# Patient Record
Sex: Male | Born: 1975 | Race: White | Hispanic: No | Smoking: Current every day smoker
Health system: Southern US, Community
[De-identification: ages and names within clinical notes are randomized; demographics above are authoritative.]

## PROBLEM LIST (undated history)

## (undated) DIAGNOSIS — G4730 Sleep apnea, unspecified: Secondary | ICD-10-CM

## (undated) DIAGNOSIS — K429 Umbilical hernia without obstruction or gangrene: Secondary | ICD-10-CM

## (undated) DIAGNOSIS — K219 Gastro-esophageal reflux disease without esophagitis: Secondary | ICD-10-CM

## (undated) DIAGNOSIS — J45909 Unspecified asthma, uncomplicated: Secondary | ICD-10-CM

## (undated) DIAGNOSIS — I10 Essential (primary) hypertension: Secondary | ICD-10-CM

## (undated) DIAGNOSIS — M109 Gout, unspecified: Secondary | ICD-10-CM

## (undated) DIAGNOSIS — J449 Chronic obstructive pulmonary disease, unspecified: Secondary | ICD-10-CM

## (undated) HISTORY — DX: Umbilical hernia without obstruction or gangrene: K42.9

## (undated) HISTORY — DX: Chronic obstructive pulmonary disease, unspecified: J44.9

## (undated) HISTORY — PX: CYST EXCISION: SHX5701

## (undated) HISTORY — DX: Unspecified asthma, uncomplicated: J45.909

---

## 2001-02-23 ENCOUNTER — Encounter: Payer: Self-pay | Admitting: Emergency Medicine

## 2001-02-23 ENCOUNTER — Emergency Department (HOSPITAL_COMMUNITY): Admission: EM | Admit: 2001-02-23 | Discharge: 2001-02-23 | Payer: Self-pay | Admitting: Emergency Medicine

## 2005-08-25 ENCOUNTER — Emergency Department (HOSPITAL_COMMUNITY): Admission: EM | Admit: 2005-08-25 | Discharge: 2005-08-25 | Payer: Self-pay | Admitting: Emergency Medicine

## 2009-03-21 ENCOUNTER — Emergency Department (HOSPITAL_COMMUNITY): Admission: EM | Admit: 2009-03-21 | Discharge: 2009-03-21 | Payer: Self-pay | Admitting: Family Medicine

## 2009-04-22 ENCOUNTER — Emergency Department (HOSPITAL_COMMUNITY): Admission: EM | Admit: 2009-04-22 | Discharge: 2009-04-22 | Payer: Self-pay | Admitting: Family Medicine

## 2009-06-20 ENCOUNTER — Emergency Department (HOSPITAL_COMMUNITY): Admission: EM | Admit: 2009-06-20 | Discharge: 2009-06-20 | Payer: Self-pay | Admitting: Family Medicine

## 2009-08-21 ENCOUNTER — Emergency Department (HOSPITAL_COMMUNITY): Admission: EM | Admit: 2009-08-21 | Discharge: 2009-08-21 | Payer: Self-pay | Admitting: Family Medicine

## 2009-09-25 ENCOUNTER — Emergency Department (HOSPITAL_COMMUNITY): Admission: EM | Admit: 2009-09-25 | Discharge: 2009-09-25 | Payer: Self-pay | Admitting: Family Medicine

## 2010-09-04 LAB — URIC ACID: Uric Acid, Serum: 10.6 mg/dL — ABNORMAL HIGH (ref 4.0–7.8)

## 2010-09-07 LAB — URIC ACID: Uric Acid, Serum: 10.6 mg/dL — ABNORMAL HIGH (ref 4.0–7.8)

## 2012-03-15 ENCOUNTER — Encounter (HOSPITAL_COMMUNITY): Payer: Self-pay | Admitting: Emergency Medicine

## 2012-03-15 ENCOUNTER — Emergency Department (INDEPENDENT_AMBULATORY_CARE_PROVIDER_SITE_OTHER)
Admission: EM | Admit: 2012-03-15 | Discharge: 2012-03-15 | Disposition: A | Discharge status: Home / Self Care | Payer: Self-pay | Source: Home / Self Care | Attending: Family Medicine | Admitting: Family Medicine

## 2012-03-15 DIAGNOSIS — S335XXA Sprain of ligaments of lumbar spine, initial encounter: Secondary | ICD-10-CM

## 2012-03-15 DIAGNOSIS — S39012A Strain of muscle, fascia and tendon of lower back, initial encounter: Secondary | ICD-10-CM

## 2012-03-15 DIAGNOSIS — M545 Low back pain: Secondary | ICD-10-CM

## 2012-03-15 HISTORY — DX: Gout, unspecified: M10.9

## 2012-03-15 HISTORY — DX: Essential (primary) hypertension: I10

## 2012-03-15 MED ORDER — LISINOPRIL 20 MG PO TABS: 20.0000 mg | ORAL_TABLET | Freq: Every day | ORAL | Status: DC

## 2012-03-15 MED ORDER — IBUPROFEN 600 MG PO TABS: 600.0000 mg | ORAL_TABLET | Freq: Three times a day (TID) | ORAL | Status: DC

## 2012-03-15 MED ORDER — ALLOPURINOL 300 MG PO TABS: 300.0000 mg | ORAL_TABLET | Freq: Every day | ORAL | Status: DC

## 2012-03-15 MED ORDER — TRAMADOL HCL 50 MG PO TABS: 50.0000 mg | ORAL_TABLET | Freq: Four times a day (QID) | ORAL | Status: DC | PRN

## 2012-03-15 MED ORDER — CYCLOBENZAPRINE HCL 10 MG PO TABS: 10.0000 mg | ORAL_TABLET | Freq: Three times a day (TID) | ORAL | Status: DC | PRN

## 2012-03-15 NOTE — ED Notes (Signed)
Pt having back pain that started yesterday evening and got progressively worse. 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 Pt states he has never had back pain like this in the past, just minor strains. Pt tried heat and aleve last night which helped some, but did not completely relieve his pain.

## 2012-03-18 NOTE — ED Provider Notes (Signed)
History     CSN: [id number]  Arrival date & time 03/15/12  1048   First MD Initiated Contact with Patient 03/15/12 1049      Chief Complaint  Patient presents with  . Back Pain    (Consider location/radiation/quality/duration/timing/severity/associated sxs/prior treatment) HPI Comments: 36 year old smoker male with history of HTN and gout. Here complaining of low back pain for 2 days. Worse on the left side. No radiation. Denies dysuria or hematuria. Denies lower extremity numbness weakness or paresthesia. Patient states that he works in [employer] and has been carried out sheet rock prior the beginning of his pain. Denies recent falls or direct injury. Patient reports he had prior history of low back pain but not as bad. Has used heat pad and took ibuprofen last night with some improvement. Patient also requiring refills on his chronic medications. He takes lisinopril 20 mg for high blood pressure and allopurinol 300 mg daily for gout. She's blood pressure is well-controlled now. Patient does not have medical insurance. He does have a primary care provider he has not seen this year.   Past Medical History  Diagnosis Date  . Hypertension   . Gout     History reviewed. No pertinent past surgical history.  History reviewed. No pertinent family history.  History  Substance Use Topics  . Smoking status: Current Every Day Smoker -- 1.0 packs/day    Types: Cigarettes  . Smokeless tobacco: Not on file  . Alcohol Use: Yes     occasional      Review of Systems  Constitutional: Negative for fever, chills, diaphoresis and fatigue.  Respiratory: Negative for cough and shortness of breath.   Cardiovascular: Negative for chest pain, palpitations and leg swelling.  Gastrointestinal: Negative for nausea, vomiting, abdominal pain and diarrhea.  Genitourinary: Negative for dysuria, frequency and hematuria.  Musculoskeletal: Positive for back pain.  Skin: Negative for rash.    Neurological: Negative for dizziness and headaches.    Allergies  Review of patient's allergies indicates no known allergies.  Home Medications   Current Outpatient Rx  Name Route Sig Dispense Refill  . ALLOPURINOL 300 MG PO TABS Oral Take 1 tablet (300 mg total) by mouth daily. 30 tablet 0  . CYCLOBENZAPRINE HCL 10 MG PO TABS Oral Take 1 tablet (10 mg total) by mouth 3 (three) times daily as needed for muscle spasms. 30 tablet 0  . IBUPROFEN 600 MG PO TABS Oral Take 1 tablet (600 mg total) by mouth 3 (three) times daily. 20 tablet 0  . LISINOPRIL 20 MG PO TABS Oral Take 1 tablet (20 mg total) by mouth daily. 30 tablet 0  . TRAMADOL HCL 50 MG PO TABS Oral Take 1 tablet (50 mg total) by mouth every 6 (six) hours as needed for pain. 20 tablet 0    BP 138/88  Pulse 77  Temp 98.3 F (36.8 C) (Oral)  Resp 16  SpO2 98%  Physical Exam  Nursing note and vitals reviewed. Constitutional: He is oriented to person, place, and time. He appears well-developed and well-nourished. No distress.  HENT:  Head: Normocephalic and atraumatic.  Eyes: Conjunctivae normal are normal. No scleral icterus.  Neck: Neck supple.  Cardiovascular: Normal rate, regular rhythm and normal heart sounds.   Pulmonary/Chest: Breath sounds normal.  Abdominal: Soft. Bowel sounds are normal. He exhibits no distension and no mass. There is no tenderness. There is no rebound and no guarding.       No CVT  Musculoskeletal:  Spine central: No obvious scoliosis or kyphosis. No pain over bone processes in entire spine.  Good range of motion.  Patient in semiflexed position. Increased pain reported with back extension.  Reported pain to palpation and increased muscle tone in left lower lumbar paravertebral muscles more than right side; worse with rotation of torso. No hematomas or other skin changes. Left hip: FROM. No pain on internal or external rotation. No clicks.     Lymphadenopathy:    He has no cervical  adenopathy.  Neurological: He is alert and oriented to person, place, and time.  Skin: No rash noted. He is not diaphoretic.    ED Course  Procedures (including critical care time)  Labs Reviewed - No data to display No results found.   1. Low back pain   2. Strain of lumbar paraspinal muscle       MDM  Treated with Flexeril, ibuprofen and tramadol. Back exercises handout provided. Supportive/preventive care discussed with patient and provided in writing. Also refilled allopurinol and lisinopril. Asked to return if persistent or worsening symptoms despite following treatment.        Sharin Grave, MD 03/20/12 0030

## 2013-06-07 ENCOUNTER — Emergency Department (HOSPITAL_COMMUNITY)
Admission: EM | Admit: 2013-06-07 | Discharge: 2013-06-07 | Disposition: A | Discharge status: Home / Self Care | Payer: Medicaid Other | Attending: Emergency Medicine | Admitting: Emergency Medicine

## 2013-06-07 ENCOUNTER — Encounter (HOSPITAL_COMMUNITY): Payer: Self-pay | Admitting: Emergency Medicine

## 2013-06-07 DIAGNOSIS — K089 Disorder of teeth and supporting structures, unspecified: Secondary | ICD-10-CM | POA: Insufficient documentation

## 2013-06-07 DIAGNOSIS — I10 Essential (primary) hypertension: Secondary | ICD-10-CM | POA: Insufficient documentation

## 2013-06-07 DIAGNOSIS — K029 Dental caries, unspecified: Secondary | ICD-10-CM | POA: Insufficient documentation

## 2013-06-07 DIAGNOSIS — Z79899 Other long term (current) drug therapy: Secondary | ICD-10-CM | POA: Insufficient documentation

## 2013-06-07 DIAGNOSIS — M109 Gout, unspecified: Secondary | ICD-10-CM | POA: Insufficient documentation

## 2013-06-07 DIAGNOSIS — K0889 Other specified disorders of teeth and supporting structures: Secondary | ICD-10-CM

## 2013-06-07 DIAGNOSIS — F17200 Nicotine dependence, unspecified, uncomplicated: Secondary | ICD-10-CM | POA: Insufficient documentation

## 2013-06-07 MED ORDER — HYDROCODONE-ACETAMINOPHEN 5-325 MG PO TABS: 1.0000 | ORAL_TABLET | ORAL | Status: DC | PRN

## 2013-06-07 MED ORDER — IBUPROFEN 400 MG PO TABS
800.0000 mg | ORAL_TABLET | Freq: Once | ORAL | Status: AC
  Administered 2013-06-07: 800 mg via ORAL
  Filled 2013-06-07: qty 2

## 2013-06-07 MED ORDER — PENICILLIN V POTASSIUM 500 MG PO TABS: 500.0000 mg | ORAL_TABLET | Freq: Four times a day (QID) | ORAL | Status: AC

## 2013-06-07 NOTE — ED Notes (Signed)
Pt. Stated, I just got out of jail and i had one bad tooth that was pulled while in jail and she said the one beside of it had a cavitiy.  i ate some ice cream on Thursday and its been hurting ever since.

## 2013-06-07 NOTE — ED Provider Notes (Signed)
CSN: [id number]     Arrival date & time 06/07/13  0916 History  This chart was scribed for non-physician practitioner, Jillyn Ledger, PA-C, working with Suzi Roots, MD by Shari Heritage, ED Scribe. This patient was seen in room TR06C/TR06C and the patient's care was started at 10:19 AM.    Chief Complaint  Patient presents with  . Dental Pain    The history is provided by the patient. No language interpreter was used.    HPI Comments: Albert Hill is a 37 y.o. Male with a PMH of HTN and gout who presents to the Emergency Department complaining of constant right upper dental pain onset 2 days ago. Patient reports that he was recently in jail where he had a tooth pulled about 1-2 weeks ago secondary to infection and dental decay. He finished a course of clindamycin about 3 weeks ago.  He states he was told that he had an tooth with a dental cary next to his pulled tooth.  He was also told this would need to be removed at a later time.  Patient states he is now having pain from this tooth.  He has taken Aleve at home without pain relief. He denies fever, dysphagia, headache, vomiting, neck pain, abdominal pain or any other symptoms at this time. He does not have a dentist.  He is a current every day smoker.    Past Medical History  Diagnosis Date  . Hypertension   . Gout    History reviewed. No pertinent past surgical history. No family history on file. History  Substance Use Topics  . Smoking status: Current Every Day Smoker -- 1.00 packs/day    Types: Cigarettes  . Smokeless tobacco: Not on file  . Alcohol Use: Yes     Comment: occasional    Review of Systems  Constitutional: Negative for fever.  HENT: Positive for dental problem. Negative for trouble swallowing.   Gastrointestinal: Negative for vomiting and abdominal pain.  Musculoskeletal: Negative for neck pain.  Neurological: Negative for headaches.  All other systems reviewed and are negative.    Allergies   Review of patient's allergies indicates no known allergies.  Home Medications   Current Outpatient Rx  Name  Route  Sig  Dispense  Refill  . allopurinol (ZYLOPRIM) 300 MG tablet   Oral   Take 1 tablet (300 mg total) by mouth daily.   30 tablet   0   . cyclobenzaprine (FLEXERIL) 10 MG tablet   Oral   Take 1 tablet (10 mg total) by mouth 3 (three) times daily as needed for muscle spasms.   30 tablet   0   . ibuprofen (ADVIL,MOTRIN) 600 MG tablet   Oral   Take 1 tablet (600 mg total) by mouth 3 (three) times daily.   20 tablet   0   . lisinopril (PRINIVIL,ZESTRIL) 20 MG tablet   Oral   Take 1 tablet (20 mg total) by mouth daily.   30 tablet   0   . traMADol (ULTRAM) 50 MG tablet   Oral   Take 1 tablet (50 mg total) by mouth every 6 (six) hours as needed for pain.   20 tablet   0    Triage Vitals: BP 154/99  Pulse 97  Temp(Src) 96.8 F (36 C) (Oral)  SpO2 100%  Filed Vitals:   06/07/13 0922 06/07/13 1028  BP: 154/99 143/98  Pulse: 97 75  Temp: 96.8 F (36 C) 97 F (36.1 C)  TempSrc: Oral Oral  Resp: 17 15  SpO2: 100% 98%    Physical Exam  Nursing note and vitals reviewed. Constitutional: He is oriented to person, place, and time. He appears well-developed and well-nourished. No distress.  HENT:  Head: Normocephalic and atraumatic.  Right Ear: Tympanic membrane, external ear and ear canal normal.  Left Ear: Tympanic membrane, external ear and ear canal normal.  Nose: Right sinus exhibits no maxillary sinus tenderness and no frontal sinus tenderness. Left sinus exhibits no maxillary sinus tenderness and no frontal sinus tenderness.  Mouth/Throat:    Tenderness to palpation to a right upper molar with evidence of a dental cary and no surrounding edema/abscess.  No trismus.  No palpable edema to the face throughout.  Uvula midline.  TM's gray and translucent bilaterally  Eyes: Conjunctivae and EOM are normal. Pupils are equal, round, and reactive to light.  Right eye exhibits no discharge. Left eye exhibits no discharge.  Neck: Normal range of motion. Neck supple. No tracheal deviation present.  No edema to the neck throughout.  No LAD.  No submental fullness.      Cardiovascular: Normal rate, regular rhythm and normal heart sounds.  Exam reveals no gallop and no friction rub.   No murmur heard. Pulmonary/Chest: Effort normal and breath sounds normal. No respiratory distress. He has no wheezes. He has no rales. He exhibits no tenderness.  Abdominal: Soft. There is no tenderness.  Musculoskeletal: Normal range of motion. He exhibits no edema and no tenderness.  Neurological: He is alert and oriented to person, place, and time.  Skin: Skin is warm and dry. He is not diaphoretic.  Psychiatric: He has a normal mood and affect. His behavior is normal.    ED Course  Procedures (including critical care time) DIAGNOSTIC STUDIES: Oxygen Saturation is 100% on room air, normal by my interpretation.    COORDINATION OF CARE: 10:23 AM- Will give ibuprofen in the ED and prescribe Vicodin for pain relief and penicillin to treat infection. Advised of return precautions.  Patient informed of current plan for treatment and evaluation and agrees with plan at this time.    MDM   Albert Hill is a 37 y.o. male with a PMH of HTN and gout who presents to the Emergency Department complaining of constant right upper dental pain onset 2 days ago.    Etiology of dental pain possibly due to dental caries.  No concerning signs/symptoms for Ludwig's Angina at this time.  Patient afebrile and non-toxic in appearance.  Patient instructed to follow-up with a dentist for further evaluation and management.  Resources provided.  Return precautions were given.  Patient in agreement with discharge and plan.    Discharge Medication List as of 06/07/2013 10:32 AM    START taking these medications   Details  HYDROcodone-acetaminophen (NORCO/VICODIN) 5-325 MG per  tablet Take 1-2 tablets by mouth every 4 (four) hours as needed., Starting 06/07/2013, Until Discontinued, Print    penicillin v potassium (VEETID) 500 MG tablet Take 1 tablet (500 mg total) by mouth 4 (four) times daily., Starting 06/07/2013, Last dose on Sat 06/14/13, Print         Final impressions: 1. Pain, dental      Thomasenia Sales   I personally performed the services described in this documentation, which was scribed in my presence. The recorded information has been reviewed and is accurate.        Jillyn Ledger, PA-C 06/08/13 2228

## 2013-06-10 NOTE — ED Provider Notes (Signed)
Medical screening examination/treatment/procedure(s) were performed by non-physician practitioner and as supervising physician I was immediately available for consultation/collaboration.  EKG Interpretation   None         Tomasita Beevers E Jaylee Freeze, MD 06/10/13 0730 

## 2013-07-09 ENCOUNTER — Encounter (HOSPITAL_COMMUNITY): Payer: Self-pay | Admitting: Emergency Medicine

## 2013-07-09 ENCOUNTER — Emergency Department (HOSPITAL_COMMUNITY)
Admission: EM | Admit: 2013-07-09 | Discharge: 2013-07-09 | Disposition: A | Discharge status: Home / Self Care | Payer: Medicaid Other | Attending: Emergency Medicine | Admitting: Emergency Medicine

## 2013-07-09 DIAGNOSIS — F17200 Nicotine dependence, unspecified, uncomplicated: Secondary | ICD-10-CM | POA: Insufficient documentation

## 2013-07-09 DIAGNOSIS — M109 Gout, unspecified: Secondary | ICD-10-CM | POA: Insufficient documentation

## 2013-07-09 DIAGNOSIS — I10 Essential (primary) hypertension: Secondary | ICD-10-CM | POA: Insufficient documentation

## 2013-07-09 MED ORDER — ALLOPURINOL 300 MG PO TABS: 300.0000 mg | ORAL_TABLET | Freq: Every day | ORAL | Status: DC

## 2013-07-09 MED ORDER — NAPROXEN 500 MG PO TABS
500.0000 mg | ORAL_TABLET | Freq: Once | ORAL | Status: AC
  Administered 2013-07-09: 500 mg via ORAL
  Filled 2013-07-09: qty 1

## 2013-07-09 MED ORDER — COLCHICINE 0.6 MG PO TABS
1.2000 mg | ORAL_TABLET | Freq: Once | ORAL | Status: AC
  Administered 2013-07-09: 1.2 mg via ORAL
  Filled 2013-07-09: qty 2

## 2013-07-09 MED ORDER — NAPROXEN 500 MG PO TABS
500.0000 mg | ORAL_TABLET | Freq: Two times a day (BID) | ORAL | Status: DC
Start: 2013-07-09 — End: 2017-11-15

## 2013-07-09 NOTE — ED Provider Notes (Signed)
CSN: [id number]     Arrival date & time 07/09/13  1611 History  This chart was scribed for non-physician practitioner Alvina Chou, PA-C working with Blanchie Dessert, MD by Ludger Nutting, ED Scribe. This patient was seen in room [facility] and the patient's care was started at 1611.    Chief Complaint  Patient presents with  . Gout    The history is provided by the patient. No language interpreter was used.    HPI Comments: Albert Hill is a 38 y.o. Male with past medical history of gout who presents to the Emergency Department complaining of constant, gradually worsened right ankle pain that began a few days ago. He denies recent injuries or trauma. He reports having a history of gout and believes this to be a flare up. He has taken Aleve at home without relief. He states he no longer takes allopurinol because he does not have a PCP. He denies numbness or weakness.   Past Medical History  Diagnosis Date  . Hypertension   . Gout    History reviewed. No pertinent past surgical history. History reviewed. No pertinent family history. History  Substance Use Topics  . Smoking status: Current Every Day Smoker -- 1.00 packs/day    Types: Cigarettes  . Smokeless tobacco: Not on file  . Alcohol Use: Yes     Comment: occasional    Review of Systems  Musculoskeletal: Positive for arthralgias (right ankle).  Neurological: Negative for weakness and numbness.    Allergies  Review of patient's allergies indicates no known allergies.  Home Medications   Current Outpatient Rx  Name  Route  Sig  Dispense  Refill  . ibuprofen (ADVIL,MOTRIN) 200 MG tablet   Oral   Take 200 mg by mouth every 6 (six) hours as needed for mild pain.         . naproxen sodium (ANAPROX) 220 MG tablet   Oral   Take 220 mg by mouth 2 (two) times daily as needed (for pain).         Marland Kitchen allopurinol (ZYLOPRIM) 300 MG tablet   Oral   Take 1 tablet (300 mg total) by mouth daily.   30 tablet   0     BP 142/97  Pulse 98  Temp(Src) 98 F (36.7 C) (Oral)  Resp 16  SpO2 96% Physical Exam  Nursing note and vitals reviewed. Constitutional: He is oriented to person, place, and time. He appears well-developed and well-nourished.  HENT:  Head: Normocephalic and atraumatic.  Cardiovascular: Normal rate and intact distal pulses.   Pulmonary/Chest: Effort normal.  Abdominal: He exhibits no distension.  Musculoskeletal: He exhibits no edema and no tenderness.  No right ankle edema. No tenderness to palpation. No redness or obvious deformity. Limited ROM due to pain.   Neurological: He is alert and oriented to person, place, and time.  Skin: Skin is warm and dry.  Psychiatric: He has a normal mood and affect.    ED Course  Procedures (including critical care time)  DIAGNOSTIC STUDIES: Oxygen Saturation is 96% on RA, adequate by my interpretation.    COORDINATION OF CARE: 4:46 PM Will give prescription for allopurinol. Discussed treatment plan with pt at bedside and pt agreed to plan.   Labs Review Labs Reviewed - No data to display Imaging Review No results found.  EKG Interpretation   None       MDM   1. Gout attack    5:05 PM Patient likely having a gout attack  due to similar sensation as previous. Patient will have colchicine here and naprosyn for pain. Patient will be discharged with allopurinol and naprosyn prescriptions. Patient advised to follow up with PCP from resource guide.   I personally performed the services described in this documentation, which was scribed in my presence. The recorded information has been reviewed and is accurate.   Alvina Chou, PA-C 07/09/13 1706

## 2013-07-09 NOTE — Discharge Instructions (Signed)
Take Naprosyn as needed for pain. Take Allopurinol daily. Refer to attached documents for more information. Follow up with a primary care doctor from the resource guide below.    Emergency Department Resource Guide 1) Find a Doctor and Pay Out of Pocket Although you won't have to find out who is covered by your insurance plan, it is a good idea to ask around and get recommendations. You will then need to call the office and see if the doctor you have chosen will accept you as a new patient and what types of options they offer for patients who are self-pay. Some doctors offer discounts or will set up payment plans for their patients who do not have insurance, but you will need to ask so you aren't surprised when you get to your appointment.  2) Contact Your Local Health Department Not all health departments have doctors that can see patients for sick visits, but many do, so it is worth a call to see if yours does. If you don't know where your local health department is, you can check in your phone book. The CDC also has a tool to help you locate your state's health department, and many state websites also have listings of all of their local health departments.  3) Find a [facility] If your illness is not likely to be very severe or complicated, you may want to try a walk in clinic. These are popping up all over the country in pharmacies, drugstores, and shopping centers. They're usually staffed by nurse practitioners or physician assistants that have been trained to treat common illnesses and complaints. They're usually fairly quick and inexpensive. However, if you have serious medical issues or chronic medical problems, these are probably not your best option.  No Primary Care Doctor: - Call Health Connect at  [phone] - they can help you locate a primary care doctor that  accepts your insurance, provides certain services, etc. - Physician Referral Service- [phone]  Chronic Pain  Problems: Organization         Address  Phone   Notes  [facility]  [phone] Patients need to be referred by their primary care doctor.   Medication Assistance: Organization         Address  Phone   Notes  [facility] [place] [address] [place], [place] [address] [phone] --Must be a resident of [facility] -- Must have NO insurance coverage whatsoever (no Medicaid/ Medicare, etc.) -- The pt. MUST have a primary care doctor that directs their care regularly and follows them in the community   Med[facility]ist  [phone]   [employer]  [phone]    Agencies that provide inexpensive medical care: Organization         Address  Phone   Notes  [place]  [phone]   [facility]    [phone]   [facility] [place], [place] [address] [phone]   [place] [address] [place] [phone]   Planned Parenthood    [phone]   [facility]    [phone]   [place] and [place] [address], [place] Phone:  [phone], Fax:  [phone] Hours of Operation:  9 am - 6 pm, M-F.  Also accepts Medicaid/Medicare and self-pay.  [facility] for [place] [address]  [place], [place], [place] Phone: [phone], Fax: [phone]. Hours of Operation:  8:30 am - 5:30 pm, M-F.  Also accepts Medicaid and self-pay.  [facility] [place] [address], [place] Phone: [phone]   [place], [place], [place] [phone], Ext. [phone] Mondays & Thursdays: 7-9 AM.  First 15 patients are seen on a first come, first serve basis.    [place] Providers:  Organization         Address  Phone   Notes  [facility] [address], [place] [phone] Also  accepts self-pay patients.  [facility] [address], [place]  [phone]   [place], [address], [place] [phone]   [facility] [address] [place] [phone]   Lucianne Lei [address], [place]   [phone] Only accepts [place] Access [place] patients after they have their name applied to their card.   Self-Pay (no insurance) in [facility]:  Organization         Address  Phone   Notes  Sickle Cell Patients, [facility] [place] [phone]   [facility] [place] [phone]   [facility] [place]  [place], [place],  [phone]   [facility]/Dr. Osei-Bonsu  [address] [place] or [address], [place] [phone] Phone number for both [place] and [place] locations is the same.  Urgent Medical and [facility] [address], [place] [phone]   [facility] [address] [place] or [address] [phone]   [facility] [address], [place] [phone], phone; [phone], fax Sees patients 1st and 3rd Saturday of every month.  Must not qualify for public or private insurance (i.e. Medicaid, Medicare, [place] Health Choice, Veterans' Benefits)  Household income should be no more than 200% of the poverty level The clinic cannot treat you if you are pregnant or think you are pregnant  Sexually transmitted diseases are not treated at the clinic.    Dental Care: Organization         Address  Phone  Notes  [facility] Department of [facility] [place] [phone] Accepts children up to age 34 who are enrolled in [place] or [place]; pregnant  women with a Medicaid card; and children who have applied for Medicaid or [place] Health Choice, but were declined, whose parents can pay a reduced fee at time of service.  [facility] of [facility]  [address], [place] [phone] Accepts children up to age 24 who are enrolled in [place] or [place]; pregnant women with a Medicaid card; and children who have applied for Medicaid or [place] Health Choice, but were declined, whose parents can pay a reduced fee at time of service.  [facility] Adult [facility] Access PROGRAM  [place] [phone] Patients are seen by appointment only. Walk-ins are not accepted. [place] will see patients 57 years of age and older. Monday - Tuesday (8am-5pm) Most Wednesdays (8:30-5pm) $30 per visit, cash only  [place]  [address], [place] [phone] Patients are seen by appointment only. Walk-ins are not accepted. [place] will see patients 47 years of age and older. One Wednesday Evening (Monthly: Volunteer Based).  $30 per visit, cash only  [place]  [phone] for adults; Children under age 20, call Graduate Pediatric Dentistry at [phone]. Children aged 75-14, please call [phone] to request a pediatric application.  Dental services are provided in all areas of dental care including fillings, crowns and bridges, complete and partial dentures, implants, gum treatment, root canals, and extractions. Preventive care is also provided. Treatment is provided to both adults and children. Patients are selected via a lottery and there is often a waiting list.   [facility] [address], [place]  [phone] [email or web address]   [facility] [address] [place], [place] [phone], Ext. [phone] Second and Fourth Thursday of each month, opens at 6:30 AM; Clinic ends at 9 AM.  Patients are  seen on a first-come first-served basis, and a limited number are seen during each clinic.   [facility]  [address] Hillard Danker [place], [place] [phone]   Eligibility Requirements You must have lived in [place], [place], or [place] counties for at least the last three months.   You cannot be eligible for state or federal sponsored [employer], including [employer], [place], or [employer].   You generally cannot be eligible for healthcare insurance through your employer.    How to apply: Eligibility screenings are held every Tuesday and Wednesday afternoon from 1:00 pm until 4:00 pm. You do not need an appointment for the interview!  [facility] [place] [address] [place], [place]   [place]  [place] Department  [place]  [phone]    Behavioral Health Resources in the Community: Intensive Outpatient Programs Organization         Address  Phone  Notes  [place]. [address] [place], [place] [phone]   [facility] [address] [place], [place]   [facility]: [facility] [address] [place], [place]   [place] [address]  [place], [place] or [phone]   Substance Abuse Resources Organization         Address  Phone  Notes  Alcohol and Drug Services  [phone]   [place]  [phone]   [facility] [place]   Chinita Pester  [phone]   Residential & Outpatient Substance Abuse Program  [phone]   Psychological Services Organization         Address  Phone  Notes  [facility] [place]  [place]  [phone]   [place] [address] [place] or [phone]    Mobile Crisis  Teams Organization         Address  Phone  Notes  [facility], Mobile Crisis Care Unit  [phone]   [facility]  [address] [place], [place]   [place] [address] [place] [phone]    Self-Help/Support Groups Organization         Address  Phone             Notes  [place] of [place] -  variety of support groups  [phone]- [phone] Call for more information  Narcotics Anonymous (NA), [facility] [address], [employer] [place]  2 meetings at this location   Residential Facilities manager         Address  Phone  Notes  [facility] [place],    [place]  [phone]   [facility]  [address] [place] 623762, [place], [place]   [place], [place] [phone] Admissions: 8am-3pm M-F  Incentives Substance [place] [address],    [place], [place] [phone]   [facility] [address] [place], [place], [place]   [facility] [address]  [place], [place]   [facility] Programs - Intensive Outpatient [address] Kristeen [address], [place], [place]   [facility] [place] [address].,  [place], [place] [phone] or [phone]   [facility] Treatment Services (RTS) [address] [place], [place] Accepts Medicaid  [facility] [place] [address],  [place] [phone] Substance Abuse/Addiction Treatment   [facility] Organization         Address  Phone  Notes  [facility]  [phone]   Domenic Schwab, PhD [address] [place], [place]   [phone] or [phone]   [place], [place] [phone]   [facility] [address] [place], [place] [phone]  Insurance/Medicaid/sponsorship through [facility] and Families [address]                                    [place], [place] [phone] [place] [address], [place] [phone]    Dr. Adele Schilder  [phone]   [facility] Dept. 1) [address] [place] 2) [place] 3)  [place] [address], [place] [phone]  [phone]   [place] [phone] or [phone] (After Hours)

## 2013-07-09 NOTE — ED Notes (Signed)
Pt reports hx of gout, flare up 4 days, right ankle. 9/10 pain. ambulatory

## 2013-07-10 NOTE — ED Provider Notes (Signed)
Medical screening examination/treatment/procedure(s) were performed by non-physician practitioner and as supervising physician I was immediately available for consultation/collaboration.  EKG Interpretation   None        Jasper Riling. Alvino Chapel, MD 07/10/13 0002

## 2017-11-15 ENCOUNTER — Emergency Department (HOSPITAL_COMMUNITY)
Admission: EM | Admit: 2017-11-15 | Discharge: 2017-11-15 | Disposition: A | Discharge status: Home / Self Care | Payer: Self-pay | Attending: Emergency Medicine | Admitting: Emergency Medicine

## 2017-11-15 ENCOUNTER — Emergency Department (HOSPITAL_COMMUNITY): Payer: Self-pay

## 2017-11-15 ENCOUNTER — Encounter (HOSPITAL_COMMUNITY): Payer: Self-pay | Admitting: *Deleted

## 2017-11-15 DIAGNOSIS — Z79899 Other long term (current) drug therapy: Secondary | ICD-10-CM | POA: Insufficient documentation

## 2017-11-15 DIAGNOSIS — B9789 Other viral agents as the cause of diseases classified elsewhere: Secondary | ICD-10-CM

## 2017-11-15 DIAGNOSIS — J069 Acute upper respiratory infection, unspecified: Secondary | ICD-10-CM | POA: Insufficient documentation

## 2017-11-15 DIAGNOSIS — J441 Chronic obstructive pulmonary disease with (acute) exacerbation: Secondary | ICD-10-CM | POA: Insufficient documentation

## 2017-11-15 DIAGNOSIS — F17210 Nicotine dependence, cigarettes, uncomplicated: Secondary | ICD-10-CM | POA: Insufficient documentation

## 2017-11-15 DIAGNOSIS — I10 Essential (primary) hypertension: Secondary | ICD-10-CM | POA: Insufficient documentation

## 2017-11-15 MED ORDER — IPRATROPIUM-ALBUTEROL 0.5-2.5 (3) MG/3ML IN SOLN
3.0000 mL | Freq: Once | RESPIRATORY_TRACT | Status: AC
  Administered 2017-11-15: 3 mL via RESPIRATORY_TRACT
  Filled 2017-11-15: qty 3

## 2017-11-15 MED ORDER — ALBUTEROL SULFATE HFA 108 (90 BASE) MCG/ACT IN AERS: 1.0000 | INHALATION_SPRAY | Freq: Four times a day (QID) | RESPIRATORY_TRACT | 0 refills | Status: DC | PRN

## 2017-11-15 MED ORDER — PREDNISONE 20 MG PO TABS
60.0000 mg | ORAL_TABLET | Freq: Once | ORAL | Status: AC
  Administered 2017-11-15: 60 mg via ORAL
  Filled 2017-11-15: qty 3

## 2017-11-15 MED ORDER — ALBUTEROL (5 MG/ML) CONTINUOUS INHALATION SOLN
10.0000 mg/h | INHALATION_SOLUTION | Freq: Once | RESPIRATORY_TRACT | Status: AC
  Administered 2017-11-15: 10 mg/h via RESPIRATORY_TRACT
  Filled 2017-11-15: qty 20

## 2017-11-15 MED ORDER — ALBUTEROL SULFATE HFA 108 (90 BASE) MCG/ACT IN AERS: 2.0000 | INHALATION_SPRAY | Freq: Four times a day (QID) | RESPIRATORY_TRACT | 0 refills | Status: DC | PRN

## 2017-11-15 MED ORDER — PREDNISONE 20 MG PO TABS: 20.0000 mg | ORAL_TABLET | Freq: Two times a day (BID) | ORAL | 0 refills | Status: DC

## 2017-11-15 MED ORDER — PREDNISONE 20 MG PO TABS: 40.0000 mg | ORAL_TABLET | Freq: Every day | ORAL | 0 refills | Status: AC

## 2017-11-15 NOTE — ED Provider Notes (Signed)
[place] [facility] DEPT Provider Note   CSN: [id number] Arrival date & time: 11/15/17  0920   History   Chief Complaint Chief Complaint  Patient presents with  . Cough  . Shortness of Breath    HPI Albert Hill is a 42 y.o. male with a past medical history of gout, HTN, opiate use, tobacco use who presented to the ED for concern of respiratory infection.   He reports a roughly 1 week history of shortness of breath with associated cough. The cough was initially productive with green sputum but has since become nonproductive.  He has also had rhinorrhea, congestion. He feels his symptoms have worsened since onset. He tried OTC mucinex with no improvement in sx. he denies fever, sore throat, no known sick contacts.  He is a smoker, 1 pack/day for 20 years.  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  On intake the nurse was concerned with his lung exam and advised presenting to the emergency department for evaluation and clearance prior to returning to rehab.  Last heroin use was yesterday.   Past Medical History:  Diagnosis Date  . Gout   . Hypertension     There are no active problems to display for this patient.   History reviewed. No pertinent surgical history.     Home Medications    Prior to Admission medications   Medication Sig Start Date End Date Taking? Authorizing Provider  acetaminophen (TYLENOL) 500 MG tablet Take 1,000 mg by mouth daily as needed for moderate pain.   Yes [provider]  guaiFENesin (MUCINEX PO) Take 2 tablets by mouth daily as needed.   Yes [provider]  albuterol (PROVENTIL HFA;VENTOLIN HFA) 108 (90 Base) MCG/ACT inhaler Inhale 2 puffs into the lungs every 6 (six) hours as needed for wheezing or shortness of breath. 11/15/17   Tawny Asal, MD  albuterol (PROVENTIL HFA;VENTOLIN HFA) 108 (90 Base) MCG/ACT inhaler Inhale 1-2 puffs into the lungs every 6 (six) hours  as needed for wheezing. 11/15/17   Tanna Furry, MD  predniSONE (DELTASONE) 20 MG tablet Take 2 tablets (40 mg total) by mouth daily with breakfast for 4 days. 11/16/17 11/20/17  Tawny Asal, MD  predniSONE (DELTASONE) 20 MG tablet Take 1 tablet (20 mg total) by mouth 2 (two) times daily with a meal. 11/15/17   Tanna Furry, MD   Not currently taking medications.   Family History No family history on file.  Social History Social History   Tobacco Use  . Smoking status: Current Every Day Smoker    Packs/day: 1.00    Types: Cigarettes  Substance Use Topics  . Alcohol use: Yes    Comment: occasional  . Drug use: No     Allergies   Patient has no known allergies.   Review of Systems Review of Systems  Constitutional: Negative for fever.  HENT: Positive for congestion and rhinorrhea. Negative for sore throat.   Respiratory: Positive for cough and shortness of breath.   Cardiovascular: Negative for leg swelling.  Gastrointestinal: Negative for abdominal pain.  Hematological: Negative for adenopathy.     Physical Exam Updated Vital Signs BP 132/70   Pulse (!) 102   Temp 98.4 F (36.9 C) (Oral)   Resp 14   Ht 5\' 11"  (1.803 m)   Wt 83.9 kg (185 lb)   SpO2 95%   BMI 25.80 kg/m   General: Sitting on edge of stretcher, fatigued appearing,  no acute distress Head: Normocephalic, atraumatic Eyes: PERRL, normal conjuctiva  ENT: Moist mucus membranes, no pharyngeal exudates  CV: RRR, no murmur appreciated  Resp: Coarse breath sounds throughout with occasional wheeze, normal work of breathing, no distress  Abd: Soft, +BS, non-tender  Extr: No LE edema, good peripheral pulses  Neuro: Alert and oriented x3, moving all extremities spontaneously  Skin: Warm, dry, normal skin turgor      ED Treatments / Results  Labs (all labs ordered are listed, but only abnormal results are displayed) Labs Reviewed - No data to display  EKG None  Radiology Dg Chest 2 View  Result Date:  11/15/2017 CLINICAL DATA:  Shortness of breath. EXAM: CHEST - 2 VIEW COMPARISON:  None. FINDINGS: The heart size and mediastinal contours are within normal limits. Both lungs are clear. No pneumothorax or pleural effusion is noted. The visualized skeletal structures are unremarkable. IMPRESSION: No active cardiopulmonary disease. Electronically Signed   By: Marijo Conception, M.D.   On: 11/15/2017 09:54    Procedures Procedures (including critical care time)  Medications Ordered in ED Medications  ipratropium-albuterol (DUONEB) 0.5-2.5 (3) MG/3ML nebulizer solution 3 mL (3 mLs Nebulization Given 11/15/17 1330)  predniSONE (DELTASONE) tablet 60 mg (60 mg Oral Given 11/15/17 1404)  albuterol (PROVENTIL,VENTOLIN) solution continuous neb (10 mg/hr Nebulization Given 11/15/17 1413)     Initial Impression / Assessment and Plan / ED Course  I have reviewed the triage vital signs and the nursing notes.  Pertinent labs & imaging results that were available during my care of the patient were reviewed by me and considered in my medical decision making (see chart for details).  42 year old with 20-pack-year smoking history presenting with shortness of breath and cough for 1 week.  He does have coarse breath sounds throughout his breathing with occasional wheeze.  Oxygen saturation around 94-96 during history, documented 93.  He may have a component of underlying obstructive lung disease given his smoking history and wheezing.  Chest x-ray did not show any evidence of pneumonia.  At this point, feel his symptoms are due to a viral upper respiratory infection, symptoms may be exacerbated by underlying smoking history.  Will administer breathing treatment, and treat empirically with steroids.  Patient improved with breathing treatments throughout ED stay and remained stable from respiratory standpoint.  Patient is safe to return back to rehab for ongoing management of opiate use.  Final Clinical Impressions(s) / ED  Diagnoses   Final diagnoses:  Viral URI with cough  COPD exacerbation [facility])    ED Discharge Orders        Ordered    predniSONE (DELTASONE) 20 MG tablet  Daily with breakfast     11/15/17 1510    albuterol (PROVENTIL HFA;VENTOLIN HFA) 108 (90 Base) MCG/ACT inhaler  Every 6 hours PRN    Note to Pharmacy:  OK to fill most affordable option   11/15/17 1510    albuterol (PROVENTIL HFA;VENTOLIN HFA) 108 (90 Base) MCG/ACT inhaler  Every 6 hours PRN     11/15/17 1511    predniSONE (DELTASONE) 20 MG tablet  2 times daily with meals     11/15/17 1511       Tawny Asal, MD 11/15/17 1516    Tanna Furry, MD 11/22/17 1153

## 2017-11-15 NOTE — ED Notes (Signed)
Called Pt to be roomed, no response in lobby x1.

## 2017-11-15 NOTE — Progress Notes (Signed)
Pt would only use the mouth piece

## 2017-11-15 NOTE — ED Triage Notes (Signed)
[facility] for possible upper [facility] infection. Pt was trying to get into rehab facility but was told he would need medical attention first.

## 2017-11-15 NOTE — ED Provider Notes (Signed)
Patient seen and evaluated.  Discussed with resident.  On exam he has had a nebulized albuterol treatment.  He has good air movement.  Still with some wheezing and prolongation.  States it made him feels "somewhat better".  93% on room air.  Plan additional of nebulized albuterol.  P.o. prednisone.  Ultimately be appropriate for outpatient treatment.  Prednisone, albuterol MDI.  Would be cleared to return to day [facility].   Tanna Furry, MD 11/15/17 1359

## 2017-11-15 NOTE — Discharge Instructions (Addendum)
Nice to meet you Albert Hill.  Your chest x ray looks clear and is reassuring that you do not have a pneumonia. You likely have a virus causing this respiratory infection that may be worse due to your smoking and because you have smoked for a long time. We are going to prescribe an inhaler to help with your breathing and a steroid that you can take for the next few days. You can also continue mucinex. It is safe to go on to your rehab facility to continue treatment.

## 2017-11-15 NOTE — ED Notes (Signed)
Respiratory called

## 2018-01-15 ENCOUNTER — Ambulatory Visit: Payer: Self-pay | Admitting: Family Medicine

## 2018-01-15 VITALS — BP 136/90 | HR 105 | Temp 98.3°F | Ht 69.29 in | Wt 204.1 lb

## 2018-01-15 DIAGNOSIS — Z09 Encounter for follow-up examination after completed treatment for conditions other than malignant neoplasm: Secondary | ICD-10-CM

## 2018-01-15 DIAGNOSIS — Z0000 Encounter for general adult medical examination without abnormal findings: Secondary | ICD-10-CM

## 2018-01-15 DIAGNOSIS — M10479 Other secondary gout, unspecified ankle and foot: Secondary | ICD-10-CM

## 2018-01-15 DIAGNOSIS — I10 Essential (primary) hypertension: Secondary | ICD-10-CM

## 2018-01-15 MED ORDER — AMLODIPINE BESYLATE 5 MG PO TABS
5.0000 mg | ORAL_TABLET | Freq: Every day | ORAL | 2 refills | Status: DC
Start: 2018-01-15 — End: 2018-04-11

## 2018-01-15 MED ORDER — LISINOPRIL 10 MG PO TABS: 10.0000 mg | ORAL_TABLET | Freq: Every day | ORAL | 2 refills | Status: DC

## 2018-01-15 MED ORDER — COLCHICINE 0.6 MG PO TABS: 0.6000 mg | ORAL_TABLET | Freq: Two times a day (BID) | ORAL | 2 refills | Status: DC

## 2018-01-15 NOTE — Patient Instructions (Signed)
Amlodipine tablets What is this medicine? AMLODIPINE (am LOE di peen) is a calcium-channel blocker. It affects the amount of calcium found in your heart and muscle cells. This relaxes your blood vessels, which can reduce the amount of work the heart has to do. This medicine is used to lower high blood pressure. It is also used to prevent chest pain. This medicine may be used for other purposes; ask your health care provider or pharmacist if you have questions. COMMON BRAND NAME(S): Norvasc What should I tell my health care provider before I take this medicine? They need to know if you have any of these conditions: -heart problems like heart failure or aortic stenosis -liver disease -an unusual or allergic reaction to amlodipine, other medicines, foods, dyes, or preservatives -pregnant or trying to get pregnant -breast-feeding How should I use this medicine? Take this medicine by mouth with a glass of water. Follow the directions on the prescription label. Take your medicine at regular intervals. Do not take more medicine than directed. Talk to your pediatrician regarding the use of this medicine in children. Special care may be needed. This medicine has been used in children as young as 6. Persons over 26 years old may have a stronger reaction to this medicine and need smaller doses. Overdosage: If you think you have taken too much of this medicine contact a poison control center or emergency room at once. NOTE: This medicine is only for you. Do not share this medicine with others. What if I miss a dose? If you miss a dose, take it as soon as you can. If it is almost time for your next dose, take only that dose. Do not take double or extra doses. What may interact with this medicine? -herbal or dietary supplements -local or general anesthetics -medicines for high blood pressure -medicines for prostate problems -rifampin This list may not describe all possible interactions. Give your health  care provider a list of all the medicines, herbs, non-prescription drugs, or dietary supplements you use. Also tell them if you smoke, drink alcohol, or use illegal drugs. Some items may interact with your medicine. What should I watch for while using this medicine? Visit your doctor or health care professional for regular check ups. Check your blood pressure and pulse rate regularly. Ask your health care professional what your blood pressure and pulse rate should be, and when you should contact him or her. This medicine may make you feel confused, dizzy or lightheaded. Do not drive, use machinery, or do anything that needs mental alertness until you know how this medicine affects you. To reduce the risk of dizzy or fainting spells, do not sit or stand up quickly, especially if you are an older patient. Avoid alcoholic drinks; they can make you more dizzy. Do not suddenly stop taking amlodipine. Ask your doctor or health care professional how you can gradually reduce the dose. What side effects may I notice from receiving this medicine? Side effects that you should report to your doctor or health care professional as soon as possible: -allergic reactions like skin rash, itching or hives, swelling of the face, lips, or tongue -breathing problems -changes in vision or hearing -chest pain -fast, irregular heartbeat -swelling of legs or ankles Side effects that usually do not require medical attention (report to your doctor or health care professional if they continue or are bothersome): -dry mouth -facial flushing -nausea, vomiting -stomach gas, pain -tired, weak -trouble sleeping This list may not describe all possible side  effects. Call your doctor for medical advice about side effects. You may report side effects to FDA at 1-800-FDA-1088. Where should I keep my medicine? Keep out of the reach of children. Store at room temperature between 59 and 86 degrees F (15 and 30 degrees C). Protect from  light. Keep container tightly closed. Throw away any unused medicine after the expiration date. NOTE: This sheet is a summary. It may not cover all possible information. If you have questions about this medicine, talk to your doctor, pharmacist, or health care provider.  2018 Elsevier/Gold Standard (2012-05-03 11:40:58) Lisinopril tablets What is this medicine? LISINOPRIL (lyse IN oh pril) is an ACE inhibitor. This medicine is used to treat high blood pressure and heart failure. It is also used to protect the heart immediately after a heart attack. This medicine may be used for other purposes; ask your health care provider or pharmacist if you have questions. COMMON BRAND NAME(S): Prinivil, Zestril What should I tell my health care provider before I take this medicine? They need to know if you have any of these conditions: -diabetes -heart or blood vessel disease -kidney disease -low blood pressure -previous swelling of the tongue, face, or lips with difficulty breathing, difficulty swallowing, hoarseness, or tightening of the throat -an unusual or allergic reaction to lisinopril, other ACE inhibitors, insect venom, foods, dyes, or preservatives -pregnant or trying to get pregnant -breast-feeding How should I use this medicine? Take this medicine by mouth with a glass of water. Follow the directions on your prescription label. You may take this medicine with or without food. If it upsets your stomach, take it with food. Take your medicine at regular intervals. Do not take it more often than directed. Do not stop taking except on your doctor's advice. Talk to your pediatrician regarding the use of this medicine in children. Special care may be needed. While this drug may be prescribed for children as young as 63 years of age for selected conditions, precautions do apply. Overdosage: If you think you have taken too much of this medicine contact a poison control center or emergency room at  once. NOTE: This medicine is only for you. Do not share this medicine with others. What if I miss a dose? If you miss a dose, take it as soon as you can. If it is almost time for your next dose, take only that dose. Do not take double or extra doses. What may interact with this medicine? Do not take this medicine with any of the following medications: -hymenoptera venom -sacubitril; valsartan This medicines may also interact with the following medications: -aliskiren -angiotensin receptor blockers, like losartan or valsartan -certain medicines for diabetes -diuretics -everolimus -gold compounds -lithium -NSAIDs, medicines for pain and inflammation, like ibuprofen or naproxen -potassium salts or supplements -salt substitutes -sirolimus -temsirolimus This list may not describe all possible interactions. Give your health care provider a list of all the medicines, herbs, non-prescription drugs, or dietary supplements you use. Also tell them if you smoke, drink alcohol, or use illegal drugs. Some items may interact with your medicine. What should I watch for while using this medicine? Visit your doctor or health care professional for regular check ups. Check your blood pressure as directed. Ask your doctor what your blood pressure should be, and when you should contact him or her. Do not treat yourself for coughs, colds, or pain while you are using this medicine without asking your doctor or health care professional for advice. Some ingredients may increase  your blood pressure. Women should inform their doctor if they wish to become pregnant or think they might be pregnant. There is a potential for serious side effects to an unborn child. Talk to your health care professional or pharmacist for more information. Check with your doctor or health care professional if you get an attack of severe diarrhea, nausea and vomiting, or if you sweat a lot. The loss of too much body fluid can make it  dangerous for you to take this medicine. You may get drowsy or dizzy. Do not drive, use machinery, or do anything that needs mental alertness until you know how this drug affects you. Do not stand or sit up quickly, especially if you are an older patient. This reduces the risk of dizzy or fainting spells. Alcohol can make you more drowsy and dizzy. Avoid alcoholic drinks. Avoid salt substitutes unless you are told otherwise by your doctor or health care professional. What side effects may I notice from receiving this medicine? Side effects that you should report to your doctor or health care professional as soon as possible: -allergic reactions like skin rash, itching or hives, swelling of the hands, feet, face, lips, throat, or tongue -breathing problems -signs and symptoms of kidney injury like trouble passing urine or change in the amount of urine -signs and symptoms of increased potassium like muscle weakness; chest pain; or fast, irregular heartbeat -signs and symptoms of liver injury like dark yellow or brown urine; general ill feeling or flu-like symptoms; light-colored stools; loss of appetite; nausea; right upper belly pain; unusually weak or tired; yellowing of the eyes or skin -signs and symptoms of low blood pressure like dizziness; feeling faint or lightheaded, falls; unusually weak or tired -stomach pain with or without nausea and vomiting Side effects that usually do not require medical attention (report to your doctor or health care professional if they continue or are bothersome): -changes in taste -cough -dizziness -fever -headache -sensitivity to light This list may not describe all possible side effects. Call your doctor for medical advice about side effects. You may report side effects to FDA at 1-800-FDA-1088. Where should I keep my medicine? Keep out of the reach of children. Store at room temperature between 15 and 30 degrees C (59 and 86 degrees F). Protect from  moisture. Keep container tightly closed. Throw away any unused medicine after the expiration date. NOTE: This sheet is a summary. It may not cover all possible information. If you have questions about this medicine, talk to your doctor, pharmacist, or health care provider.  2018 Elsevier/Gold Standard (2015-07-26 12:52:35) Colchicine tablets or capsules What is this medicine? COLCHICINE (KOL chi seen) is for joint pain and swelling due to attacks of acute gouty arthritis. The medicine is also used to treat familial Mediterranean fever. This medicine may be used for other purposes; ask your health care provider or pharmacist if you have questions. COMMON BRAND NAME(S): Colcrys, MITIGARE What should I tell my health care provider before I take this medicine? They need to know if you have any of these conditions: -anemia -blood disorders like leukemia or lymphoma -heart disease -immune system problems -intestinal disease -kidney disease -liver disease -muscle pain or weakness -take other medicines -stomach problems -an unusual or allergic reaction to colchicine, other medicines, lactose, foods, dyes, or preservatives -pregnant or trying to get pregnant -breast-feeding How should I use this medicine? Take this medicine by mouth with a full glass of water. Follow the directions on the prescription label. You  can take it with or without food. If it upsets your stomach, take it with food. Take your medicine at regular intervals. Do not take your medicine more often than directed. A special MedGuide will be given to you by the pharmacist with each prescription and refill. Be sure to read this information carefully each time. Talk to your pediatrician regarding the use of this medicine in children. While this drug may be prescribed for children as young as 31 years old for selected conditions, precautions do apply. Patients over 45 years old may have a stronger reaction and need a smaller  dose. Overdosage: If you think you have taken too much of this medicine contact a poison control center or emergency room at once. NOTE: This medicine is only for you. Do not share this medicine with others. What if I miss a dose? If you miss a dose, take it as soon as you can. If it is almost time for your next dose, take only that dose. Do not take double or extra doses. What may interact with this medicine? Do not take this medicine with any of the following medications: -certain medicines for fungal infections like itraconazole This medicine may also interact with the following medications: -alcohol -certain medicines for cholesterol like atorvastatin -certain medicines for coughs and colds -certain medicines to help you breathe better -cyclosporine -digoxin -epinephrine -grapefruit or grapefruit juice -methenamine -other medicines for fungal infection -sodium bicarbonate -some antibiotics like clarithromycin, erythromycin, and telithromycin -some medicines for an irregular heartbeat or other heart problems -some medicines for cancer, like lapatinib and tamoxifen -some medicines for HIV This list may not describe all possible interactions. Give your health care provider a list of all the medicines, herbs, non-prescription drugs, or dietary supplements you use. Also tell them if you smoke, drink alcohol, or use illegal drugs. Some items may interact with your medicine. What should I watch for while using this medicine? Visit your doctor or health care professional for regular checks on your progress. You may need periodic blood checks. Alcohol can increase the chance of getting stomach problems and gout attacks. Do not drink alcohol. What side effects may I notice from receiving this medicine? Side effects that you should report to your doctor or health care professional as soon as possible: -allergic reactions like skin rash, itching or hives, swelling of the face, lips, or  tongue -fever, chills, or sore throat -muscle tenderness, pain, or weakness -numbness or tingling in hands or feet -unusual bleeding or bruising -unusually weak or tired -vomiting Side effects that usually do not require medical attention (report to your doctor or health care professional if they continue or are bothersome): -diarrhea -hair loss -loss of appetite -stomach pain or nausea This list may not describe all possible side effects. Call your doctor for medical advice about side effects. You may report side effects to FDA at 1-800-FDA-1088. Where should I keep my medicine? Keep out of the reach of children. Store at room temperature between 15 and 30 degrees C (59 and 86 degrees F). Keep container tightly closed. Protect from light. Throw away any unused medicine after the expiration date. NOTE: This sheet is a summary. It may not cover all possible information. If you have questions about this medicine, talk to your doctor, pharmacist, or health care provider.  2018 Elsevier/Gold Standard (2012-12-02 16:48:38)

## 2018-01-15 NOTE — Progress Notes (Signed)
New Patient-Establish Care  Subjective:    Patient ID: Albert Hill, male    DOB: Nov 01, 1975, 42 y.o.   MRN: [id number]   Chief Complaint  Patient presents with  . Establish Care    pt reports a history of gout and is concerned he is having a flare up; also reports a history of hypertension     HPI  Albert Hill has a past medical history of Hypertension and Gout.   Current Status: He is here today to establish care. He is a current resident of [facility]). He is doing well, but has c/o gouty pain in ankles. He states that he began getting pain once he joined program at [facility] r/t his new diet there.   He denies fevers, chills, fatigue, recent infections, weight loss, and night sweats. He has not had any headaches, visual changes, dizziness, and falls. No chest pain, heart palpitations, cough and shortness of breath reported. No reports of GI problems such as nausea, vomiting, diarrhea, and constipation. He has no reports of blood in stools, dysuria and hematuria. No depression or anxiety. He denies pain today.   Past Medical History:  Diagnosis Date  . Gout   . Hypertension     Family History  Problem Relation Age of Onset  . Lung cancer Father     Social History   Socioeconomic History  . Marital status: Divorced    Spouse name: Not on file  . Number of children: Not on file  . Years of education: Not on file  . Highest education level: Not on file  Occupational History  . Not on file  Social Needs  . Financial resource strain: Not hard at all  . Food insecurity:    Worry: Never true    Inability: Never true  . Transportation needs:    Medical: No    Non-medical: No  Tobacco Use  . Smoking status: Current Every Day Smoker    Packs/day: 1.00    Types: Cigarettes  . Smokeless tobacco: Current User    Types: Chew  Substance and Sexual Activity  . Alcohol use: Not Currently    Comment: occasional  . Drug use: No  . Sexual activity:  Not Currently  Lifestyle  . Physical activity:    Days per week: 4 days    Minutes per session: 150+ min  . Stress: Very much  Relationships  . Social connections:    Talks on phone: More than three times a week    Gets together: More than three times a week    Attends religious service: 1 to 4 times per year    Active member of club or organization: No    Attends meetings of clubs or organizations: Never    Relationship status: Divorced  . Intimate partner violence:    Fear of current or ex partner: No    Emotionally abused: No    Physically abused: No    Forced sexual activity: No  Other Topics Concern  . Not on file  Social History Narrative  . Not on file    No past surgical history on file.    There is no immunization history on file for this patient.  Current Meds  Medication Sig  . acetaminophen (TYLENOL) 500 MG tablet Take 1,000 mg by mouth daily as needed for moderate pain.    No Known Allergies  BP 136/90 (BP Location: Left Arm, Patient Position: Sitting, Cuff Size: Normal)   Pulse (!) 105  Temp 98.3 F (36.8 C) (Oral)   Ht 5' 9.29" (1.76 m)   Wt 204 lb 1.6 oz (92.6 kg)   BMI 29.89 kg/m   Review of Systems  Constitutional: Negative.   HENT: Negative.   Eyes: Negative.   Respiratory: Negative.   Cardiovascular: Negative.   Gastrointestinal: Negative.   Endocrine: Negative.   Genitourinary: Negative.   Musculoskeletal: Positive for arthralgias (Gouty arthritis ).  Skin: Negative.   Allergic/Immunologic: Negative.   Neurological: Negative.   Hematological: Negative.   Psychiatric/Behavioral: Negative.    Objective:   Physical Exam  Constitutional: He is oriented to person, place, and time. He appears well-developed and well-nourished.  HENT:  Head: Normocephalic and atraumatic.  Right Ear: External ear normal.  Left Ear: External ear normal.  Nose: Nose normal.  Mouth/Throat: Oropharynx is clear and moist.  Eyes: Pupils are equal, round,  and reactive to light. Conjunctivae and EOM are normal.  Neck: Normal range of motion. Neck supple.  Cardiovascular: Normal rate, regular rhythm, normal heart sounds and intact distal pulses.  Pulmonary/Chest: Effort normal and breath sounds normal.  Abdominal: Soft. Bowel sounds are normal.  Musculoskeletal: Normal range of motion.  Neurological: He is alert and oriented to person, place, and time.  Skin: Skin is warm and dry. Capillary refill takes less than 2 seconds.  Psychiatric: He has a normal mood and affect. His behavior is normal. Judgment and thought content normal.  Nursing note and vitals reviewed.  Assessment & Plan:   1. Healthcare maintenance We will obtain basic labs today to further evaluate health status.  - TSH - CBC w/Diff - Comprehensive metabolic panel - Lipid Profile - Uric acid  2. Other secondary gout of ankle, unspecified chronicity, unspecified laterality He has a history Gout. We will initiate Colchicine today.  - colchicine 0.6 MG tablet; Take 1 tablet (0.6 mg total) by mouth 2 (two) times daily.  Dispense: 60 tablet; Refill: 2  3. Essential hypertension Blood pressure is 136/90. He will be initiated on Norvasc and Lisinopril today.   He will continue to decrease high sodium intake, excessive alcohol intake, increase potassium intake, smoking cessation, and increase physical activity of at least 30 minutes of cardio activity daily. He will continue to follow Heart Healthy or DASH diet.  - amLODipine (NORVASC) 5 MG tablet; Take 1 tablet (5 mg total) by mouth daily.  Dispense: 30 tablet; Refill: 2 - lisinopril (PRINIVIL,ZESTRIL) 10 MG tablet; Take 1 tablet (10 mg total) by mouth daily.  Dispense: 30 tablet; Refill: 2  4. Follow up He will follow up in 2 weeks.   Meds ordered this encounter  Medications  . amLODipine (NORVASC) 5 MG tablet    Sig: Take 1 tablet (5 mg total) by mouth daily.    Dispense:  30 tablet    Refill:  2  . lisinopril  (PRINIVIL,ZESTRIL) 10 MG tablet    Sig: Take 1 tablet (10 mg total) by mouth daily.    Dispense:  30 tablet    Refill:  2  . colchicine 0.6 MG tablet    Sig: Take 1 tablet (0.6 mg total) by mouth 2 (two) times daily.    Dispense:  60 tablet    Refill:  2    Kathe Becton,  MSN, FNP-C [facility] [address] [place], [place] [address] [phone]

## 2018-01-16 LAB — CBC WITH DIFFERENTIAL/PLATELET
Basophils Absolute: 0.1 10*3/uL (ref 0.0–0.2)
Basos: 1 %
EOS (ABSOLUTE): 0.5 10*3/uL — ABNORMAL HIGH (ref 0.0–0.4)
Eos: 5 %
Hematocrit: 38.6 % (ref 37.5–51.0)
Hemoglobin: 12.9 g/dL — ABNORMAL LOW (ref 13.0–17.7)
Immature Grans (Abs): 0 10*3/uL (ref 0.0–0.1)
Immature Granulocytes: 0 %
Lymphocytes Absolute: 3.3 10*3/uL — ABNORMAL HIGH (ref 0.7–3.1)
Lymphs: 34 %
MCH: 29.6 pg (ref 26.6–33.0)
MCHC: 33.4 g/dL (ref 31.5–35.7)
MCV: 89 fL (ref 79–97)
Monocytes Absolute: 0.6 10*3/uL (ref 0.1–0.9)
Monocytes: 6 %
Neutrophils Absolute: 5.1 10*3/uL (ref 1.4–7.0)
Neutrophils: 54 %
Platelets: 253 10*3/uL (ref 150–450)
RBC: 4.36 x10E6/uL (ref 4.14–5.80)
RDW: 13.8 % (ref 12.3–15.4)
WBC: 9.5 10*3/uL (ref 3.4–10.8)

## 2018-01-16 LAB — LIPID PANEL
Chol/HDL Ratio: 3.5 ratio (ref 0.0–5.0)
Cholesterol, Total: 165 mg/dL (ref 100–199)
HDL: 47 mg/dL (ref >39)
LDL Calculated: 80 mg/dL (ref 0–99)
Triglycerides: 190 mg/dL — ABNORMAL HIGH (ref 0–149)
VLDL Cholesterol Cal: 38 mg/dL (ref 5–40)

## 2018-01-16 LAB — COMPREHENSIVE METABOLIC PANEL
ALT: 13 IU/L (ref 0–44)
AST: 14 IU/L (ref 0–40)
Albumin/Globulin Ratio: 2 (ref 1.2–2.2)
Albumin: 4.5 g/dL (ref 3.5–5.5)
Alkaline Phosphatase: 73 IU/L (ref 39–117)
BUN/Creatinine Ratio: 10 (ref 9–20)
BUN: 10 mg/dL (ref 6–24)
Bilirubin Total: <0.2 mg/dL (ref 0.0–1.2)
CO2: 24 mmol/L (ref 20–29)
Calcium: 9.5 mg/dL (ref 8.7–10.2)
Chloride: 100 mmol/L (ref 96–106)
Creatinine, Ser: 1 mg/dL (ref 0.76–1.27)
GFR calc Af Amer: 106 mL/min/{1.73_m2} (ref >59)
GFR calc non Af Amer: 92 mL/min/{1.73_m2} (ref >59)
Globulin, Total: 2.3 g/dL (ref 1.5–4.5)
Glucose: 93 mg/dL (ref 65–99)
Potassium: 4.2 mmol/L (ref 3.5–5.2)
Sodium: 138 mmol/L (ref 134–144)
Total Protein: 6.8 g/dL (ref 6.0–8.5)

## 2018-01-16 LAB — TSH: TSH: 2.67 u[IU]/mL (ref 0.450–4.500)

## 2018-01-16 LAB — URIC ACID: Uric Acid: 9.1 mg/dL — ABNORMAL HIGH (ref 3.7–8.6)

## 2018-01-31 ENCOUNTER — Ambulatory Visit: Payer: Self-pay | Admitting: Adult Health Nurse Practitioner

## 2018-01-31 VITALS — BP 130/85 | HR 102 | Temp 98.1°F | Ht 70.0 in | Wt 209.8 lb

## 2018-01-31 DIAGNOSIS — J00 Acute nasopharyngitis [common cold]: Secondary | ICD-10-CM | POA: Insufficient documentation

## 2018-01-31 DIAGNOSIS — I10 Essential (primary) hypertension: Secondary | ICD-10-CM

## 2018-01-31 MED ORDER — ALLOPURINOL 100 MG PO TABS: 100.0000 mg | ORAL_TABLET | Freq: Every day | ORAL | 2 refills | Status: DC

## 2018-01-31 MED ORDER — ALBUTEROL SULFATE HFA 108 (90 BASE) MCG/ACT IN AERS: 2.0000 | INHALATION_SPRAY | Freq: Four times a day (QID) | RESPIRATORY_TRACT | 2 refills | Status: DC | PRN

## 2018-01-31 MED ORDER — GUAIFENESIN 100 MG/5ML PO SOLN: 5.0000 mL | ORAL | 0 refills | Status: DC | PRN

## 2018-01-31 NOTE — Progress Notes (Signed)
   Subjective:    Patient ID: Albert Hill, male    DOB: 1975-06-30, 42 y.o.   MRN: [id number]  HPI  Zacary Bauer is a 42 yo M here for f/u of lab results, Gout of ankle, and HTN. He presents w/ c/o cold symptoms with cough.  Gout: At last visit, rx Colchinie 0.6mg  BID but pharmacy couldn't fill. He states he did well on Allopurinol previously.  HTN: At last visit, began Norvasc 5mg  and Lisinopril 10mg .  He is a current smoker.   There are no active problems to display for this patient.  Allergies as of 01/31/2018   No Known Allergies     Medication List        Accurate as of 01/31/18  7:22 PM. Always use your most recent med list.          acetaminophen 500 MG tablet Commonly known as:  TYLENOL Take 1,000 mg by mouth daily as needed for moderate pain.   albuterol 108 (90 Base) MCG/ACT inhaler Commonly known as:  PROVENTIL HFA;VENTOLIN HFA Inhale 2 puffs into the lungs every 6 (six) hours as needed for wheezing or shortness of breath.   albuterol 108 (90 Base) MCG/ACT inhaler Commonly known as:  PROVENTIL HFA;VENTOLIN HFA Inhale 1-2 puffs into the lungs every 6 (six) hours as needed for wheezing.   amLODipine 5 MG tablet Commonly known as:  NORVASC Take 1 tablet (5 mg total) by mouth daily.   colchicine 0.6 MG tablet Take 1 tablet (0.6 mg total) by mouth 2 (two) times daily.   lisinopril 10 MG tablet Commonly known as:  PRINIVIL,ZESTRIL Take 1 tablet (10 mg total) by mouth daily.   MUCINEX PO Take 2 tablets by mouth daily as needed.   predniSONE 20 MG tablet Commonly known as:  DELTASONE Take 1 tablet (20 mg total) by mouth 2 (two) times daily with a meal.        Review of Systems  All other systems reviewed and are negative.  Last Uric Acid elevated at 9.1 on 7/30.     Objective:   Physical Exam  Constitutional: He is oriented to person, place, and time. He appears well-developed and well-nourished.  HENT:  Nose: No sinus tenderness.    Cardiovascular: Normal rate, regular rhythm and normal heart sounds.  Pulmonary/Chest: Effort normal and breath sounds normal.  Abdominal: Soft. Bowel sounds are normal.  Lymphadenopathy:    He has no cervical adenopathy.    He has no axillary adenopathy.  Neurological: He is alert and oriented to person, place, and time.  Skin: Skin is warm and dry.  Psychiatric: He has a normal mood and affect. His behavior is normal. Judgment and thought content normal.  Vitals reviewed.   BP 130/85   Pulse (!) 102   Temp 98.1 F (36.7 C)   Ht 5\' 10"  (1.778 m)   Wt 209 lb 12.8 oz (95.2 kg)   BMI 30.10 kg/m        Assessment & Plan:   Gout: Begin Allopurinol 100mg .   HTN:  Improved from last visit at 130/85. Continue current therapy.  Check Creatinine tonight.   Common Cold: Rx for Robitussin at night for cold.   Supportive care.  If symptoms don't improve in 7-10 days, pt can return.   F/u in 4 mo for healthcare maintenance.

## 2018-02-01 LAB — BASIC METABOLIC PANEL
BUN / CREAT RATIO: 23 — AB (ref 9–20)
BUN: 17 mg/dL (ref 6–24)
CO2: 24 mmol/L (ref 20–29)
CREATININE: 0.75 mg/dL — AB (ref 0.76–1.27)
Calcium: 10.5 mg/dL — ABNORMAL HIGH (ref 8.7–10.2)
Chloride: 99 mmol/L (ref 96–106)
GFR calc Af Amer: 130 mL/min/{1.73_m2} (ref >59)
GFR, EST NON AFRICAN AMERICAN: 112 mL/min/{1.73_m2} (ref >59)
GLUCOSE: 101 mg/dL — AB (ref 65–99)
Potassium: 4.7 mmol/L (ref 3.5–5.2)
SODIUM: 138 mmol/L (ref 134–144)

## 2018-03-04 ENCOUNTER — Ambulatory Visit: Payer: Self-pay | Admitting: Pharmacy Technician

## 2018-03-05 ENCOUNTER — Other Ambulatory Visit: Payer: Self-pay

## 2018-03-05 ENCOUNTER — Telehealth: Payer: Self-pay | Admitting: Adult Health Nurse Practitioner

## 2018-03-05 DIAGNOSIS — I270 Primary pulmonary hypertension: Secondary | ICD-10-CM

## 2018-03-05 MED ORDER — ALLOPURINOL 100 MG PO TABS: 100.0000 mg | ORAL_TABLET | Freq: Every day | ORAL | 2 refills | Status: DC

## 2018-03-06 NOTE — Progress Notes (Signed)
Patient scheduled for eligibility appointment at [facility].  Patient did not show for the appointment on March 04, 2018 at 2:00p.m.  Patient faxed poi and contract.  Patient approved to receive medication assistance at [facility] as long as eligibility criteria continues to be met.  [facility]

## 2018-04-11 ENCOUNTER — Telehealth: Payer: Self-pay | Admitting: Pharmacist

## 2018-04-11 ENCOUNTER — Ambulatory Visit: Payer: Self-pay | Admitting: Family Medicine

## 2018-04-11 VITALS — BP 136/88 | HR 86 | Temp 97.9°F | Wt 218.0 lb

## 2018-04-11 DIAGNOSIS — M109 Gout, unspecified: Secondary | ICD-10-CM | POA: Insufficient documentation

## 2018-04-11 DIAGNOSIS — N489 Disorder of penis, unspecified: Secondary | ICD-10-CM | POA: Insufficient documentation

## 2018-04-11 DIAGNOSIS — M10479 Other secondary gout, unspecified ankle and foot: Secondary | ICD-10-CM

## 2018-04-11 DIAGNOSIS — I10 Essential (primary) hypertension: Secondary | ICD-10-CM

## 2018-04-11 MED ORDER — ALBUTEROL SULFATE HFA 108 (90 BASE) MCG/ACT IN AERS: 2.0000 | INHALATION_SPRAY | Freq: Four times a day (QID) | RESPIRATORY_TRACT | 4 refills | Status: DC | PRN

## 2018-04-11 MED ORDER — ALLOPURINOL 100 MG PO TABS: 100.0000 mg | ORAL_TABLET | Freq: Every day | ORAL | 0 refills | Status: DC

## 2018-04-11 MED ORDER — AMLODIPINE BESYLATE 5 MG PO TABS: 5.0000 mg | ORAL_TABLET | Freq: Every day | ORAL | 0 refills | Status: DC

## 2018-04-11 MED ORDER — LISINOPRIL 10 MG PO TABS: 10.0000 mg | ORAL_TABLET | Freq: Every day | ORAL | 1 refills | Status: DC

## 2018-04-11 NOTE — Assessment & Plan Note (Signed)
Has been off his medicine. Restart allopurinol and recheck labs at appointment in December.

## 2018-04-11 NOTE — Telephone Encounter (Signed)
04/11/2018 8:38:29 AM - Colcrys  04/11/18 Received pharmacy printout for Colcrys 0.6mg  Take one tablet by mouth 2 times a day, printed [employer] application-mailing patient his portion to sign & return, also taking to [facility] for provider to sign.Delos Haring

## 2018-04-11 NOTE — Assessment & Plan Note (Signed)
Under good control today. Continue to monitor. Refills given today. Will recheck with blood work at scheduled appointment on 06/01/18. Call with any concerns.

## 2018-04-11 NOTE — Progress Notes (Signed)
BP 136/88 (BP Location: Left Arm)   Pulse 86   Temp 97.9 F (36.6 C)   Wt 218 lb (98.9 kg)   BMI 31.28 kg/m    Subjective:    Patient ID: Albert Hill, male    DOB: 18-Jun-1976, 42 y.o.   MRN: [id number]  HPI: Albert Hill is a 42 y.o. male  Chief Complaint  Patient presents with  . Medication Refill   HYPERTENSION Hypertension status: controlled  Satisfied with current treatment? yes Duration of hypertension: chronic BP monitoring frequency:  not checking BP medication side effects:  no Medication compliance: good compliance Previous BP meds: amlodipine and lisinopril  Aspirin: no Recurrent headaches: no Visual changes: no Palpitations: no Dyspnea: no Chest pain: no Lower extremity edema: no Dizzy/lightheaded: no  Gout- on allopurinol. On Monday he had a lot of pain in his ankle and the back of his foot. It was bad on Monday and then started to get better on Tuesday. Has been doing well with the allopurinol. He has been off his medicine for the past month.   Relevant past medical, surgical, family and social history reviewed and updated as indicated. Interim medical history since our last visit reviewed. Allergies and medications reviewed and updated.  Review of Systems  Constitutional: Negative.   Respiratory: Negative.   Cardiovascular: Negative.   Musculoskeletal: Positive for arthralgias and joint swelling. Negative for back pain, gait problem, myalgias, neck pain and neck stiffness.  Skin: Negative.   Psychiatric/Behavioral: Negative.     Per HPI unless specifically indicated above     Objective:    BP 136/88 (BP Location: Left Arm)   Pulse 86   Temp 97.9 F (36.6 C)   Wt 218 lb (98.9 kg)   BMI 31.28 kg/m   Wt Readings from Last 3 Encounters:  04/11/18 218 lb (98.9 kg)  01/31/18 209 lb 12.8 oz (95.2 kg)  01/15/18 204 lb 1.6 oz (92.6 kg)    Physical Exam  Constitutional: He is oriented to person, place, and time. He appears  well-developed and well-nourished. No distress.  HENT:  Head: Normocephalic and atraumatic.  Right Ear: Hearing normal.  Left Ear: Hearing normal.  Nose: Nose normal.  Eyes: Conjunctivae and lids are normal. Right eye exhibits no discharge. Left eye exhibits no discharge. No scleral icterus.  Cardiovascular: Normal rate, regular rhythm, normal heart sounds and intact distal pulses. Exam reveals no gallop and no friction rub.  No murmur heard. Pulmonary/Chest: Effort normal and breath sounds normal. No stridor. No respiratory distress. He has no wheezes. He has no rales. He exhibits no tenderness.  Musculoskeletal: Normal range of motion.  Neurological: He is alert and oriented to person, place, and time.  Skin: Skin is warm, dry and intact. Capillary refill takes less than 2 seconds. No rash noted. He is not diaphoretic. No erythema. No pallor.  Psychiatric: He has a normal mood and affect. His speech is normal and behavior is normal. Judgment and thought content normal. Cognition and memory are normal.  Nursing note and vitals reviewed.   Results for orders placed or performed in visit on 40/98/11  Basic Metabolic Panel (BMET)  Result Value Ref Range   Glucose 101 (H) 65 - 99 mg/dL   BUN 17 6 - 24 mg/dL   Creatinine, Ser 0.75 (L) 0.76 - 1.27 mg/dL   GFR calc non Af Amer 112 >59 mL/min/1.73   GFR calc Af Amer 130 >59 mL/min/1.73   BUN/Creatinine Ratio 23 (H) 9 -  20   Sodium 138 134 - 144 mmol/L   Potassium 4.7 3.5 - 5.2 mmol/L   Chloride 99 96 - 106 mmol/L   CO2 24 20 - 29 mmol/L   Calcium 10.5 (H) 8.7 - 10.2 mg/dL      Assessment & Plan:   Problem List Items Addressed This Visit      Cardiovascular and Mediastinum   Essential hypertension - Primary    Under good control today. Continue to monitor. Refills given today. Will recheck with blood work at scheduled appointment on 06/01/18. Call with any concerns.       Relevant Medications   amLODipine (NORVASC) 5 MG tablet    lisinopril (PRINIVIL,ZESTRIL) 10 MG tablet     Musculoskeletal and Integument   Gout of ankle    Has been off his medicine. Restart allopurinol and recheck labs at appointment in December.       Relevant Medications   allopurinol (ZYLOPRIM) 100 MG tablet     Other   Penile lesion    Not examined today- will get him back when Zara Council PA is here.           Follow up plan: Return As scheduled for 06/01/18.

## 2018-04-11 NOTE — Progress Notes (Signed)
Need refills on Albuterol and allopurinol

## 2018-04-11 NOTE — Assessment & Plan Note (Signed)
Not examined today- will get him back when Zara Council PA is here.

## 2018-04-15 ENCOUNTER — Telehealth: Payer: Self-pay | Admitting: Pharmacist

## 2018-04-15 NOTE — Telephone Encounter (Signed)
04/15/2018 11:30:06 AM - Ventolin HFA New Med  04/15/18 Received pharmacy printout for new med-Ventolin HFA 45mcg Inhale 2 puffs into the lungs every 6 hours as needed for wheezing or shortness of breath-Printed [employer] application-mailing patient his portion to sign & return, sending script to [facility].Albert Hill

## 2018-04-22 ENCOUNTER — Telehealth: Payer: Self-pay | Admitting: Pharmacist

## 2018-04-22 NOTE — Telephone Encounter (Signed)
04/22/2018 10:43:38 AM - Colcrys pending  04/22/18 I have received the signed provider portion of [employer] application for Colcrys 0.6mg  Take one tablet by mouth 2 times a day, holding for patient to return his portion-mailed to patient 04/11/18.Delos Haring

## 2018-05-03 ENCOUNTER — Telehealth: Payer: Self-pay | Admitting: Pharmacist

## 2018-05-03 NOTE — Telephone Encounter (Signed)
05/03/2018 12:14:48 PM - Ventolin pending  05/03/18 I have received the signed script back from provider, holding for patient to return his portion for Ventolin, mailed to patient 04/15/18.Albert Hill

## 2018-06-06 ENCOUNTER — Ambulatory Visit: Payer: Self-pay | Admitting: Urology

## 2018-06-06 VITALS — BP 142/85 | HR 106 | Temp 98.7°F | Wt 227.8 lb

## 2018-06-06 DIAGNOSIS — M1000 Idiopathic gout, unspecified site: Secondary | ICD-10-CM

## 2018-06-06 DIAGNOSIS — R1033 Periumbilical pain: Secondary | ICD-10-CM

## 2018-06-06 MED ORDER — ALBUTEROL SULFATE HFA 108 (90 BASE) MCG/ACT IN AERS: 2.0000 | INHALATION_SPRAY | Freq: Four times a day (QID) | RESPIRATORY_TRACT | 4 refills | Status: DC | PRN

## 2018-06-06 NOTE — Progress Notes (Signed)
Patient having stomach pain on and off for 1 month; ankle pain as well.

## 2018-06-06 NOTE — Progress Notes (Signed)
Patient: Albert Hill Male    DOB: Feb 20, 1976   42 y.o.   MRN: [id number] Visit Date: 06/06/2018  Today's Provider: [facility]   Chief Complaint  Patient presents with  . Knee Pain  . Abdominal Pain   Subjective:    HPI The stomach pain is located in the RUQ and around his umbilicus with a lump in the umbilicus.  This has been going on for one month.  It is associated with a mild nausea.  Pain is independent of food intake.  Pepto-Bismol helped at first.   Stopped soda to try to ease the pain and it worked at first, but then the pain returned.  Reports regular BM's.  BM's have been darker.  No complaints of heart burn.  Has been taking Prevacid since Friday and pain seems to have reduced.   Has a history of H. Pylori three years ago.    Former heroin abuser.    Right ankle pain worsening with walking and eases up with elevation   He states the spot on his penis has disappeared    No Known Allergies Previous Medications   ACETAMINOPHEN (TYLENOL) 500 MG TABLET    Take 1,000 mg by mouth daily as needed for moderate pain.   ALBUTEROL (PROVENTIL HFA;VENTOLIN HFA) 108 (90 BASE) MCG/ACT INHALER    Inhale 2 puffs into the lungs every 6 (six) hours as needed for wheezing or shortness of breath.   ALLOPURINOL (ZYLOPRIM) 100 MG TABLET    Take 1 tablet (100 mg total) by mouth daily.   AMLODIPINE (NORVASC) 5 MG TABLET    Take 1 tablet (5 mg total) by mouth daily.   LISINOPRIL (PRINIVIL,ZESTRIL) 10 MG TABLET    Take 1 tablet (10 mg total) by mouth daily.    Review of Systems  Social History   Tobacco Use  . Smoking status: Current Every Day Smoker    Packs/day: 1.00    Types: Cigarettes  . Smokeless tobacco: Current User    Types: Chew  Substance Use Topics  . Alcohol use: Not Currently    Comment: occasional   Objective:   BP (!) 142/85   Pulse (!) 106   Temp 98.7 F (37.1 C) (Oral)   Wt 227 lb 12.8 oz (103.3 kg)   BMI 32.69 kg/m   Physical Exam Vitals  signs and nursing note reviewed.  Constitutional:      Appearance: He is well-developed. He is obese.  HENT:     Head: Normocephalic and atraumatic.     Mouth/Throat:     Mouth: Mucous membranes are moist.     Pharynx: Oropharynx is clear.  Eyes:     Extraocular Movements: Extraocular movements intact.     Pupils: Pupils are equal, round, and reactive to light.  Cardiovascular:     Rate and Rhythm: Normal rate and regular rhythm.     Heart sounds: Normal heart sounds.  Pulmonary:     Effort: Pulmonary effort is normal.     Breath sounds: Normal breath sounds.  Abdominal:     General: Abdomen is flat. Bowel sounds are normal.     Palpations: Abdomen is soft.     Tenderness: There is abdominal tenderness in the periumbilical area. There is no right CVA tenderness, left CVA tenderness, guarding or rebound. Negative signs include Murphy's sign, Rovsing's sign, McBurney's sign, psoas sign and obturator sign.     Hernia: A hernia is present. Hernia is present in the umbilical area.  Skin:  General: Skin is warm and dry.  Neurological:     General: No focal deficit present.     Mental Status: He is alert.  Psychiatric:        Mood and Affect: Mood normal.        Behavior: Behavior normal.    Right ankle exam is normal at today's exam      Assessment & Plan:     1. Umbilical hernia  - periumbilical tenderness - will obtain an abdominal ultrasound for further evaluation   - check HbgA1c, CBC, CMP, Lipid, uric acid, TSH  - reviewed Red Flag signs, fever, chills, no flatus, intolerable pain or vomiting   2. Ankle pain 2nd to gout  - check uric acid levels today  3. Penile lesion  - patient states it has abated   [facility]

## 2018-06-07 LAB — COMPREHENSIVE METABOLIC PANEL
ALBUMIN: 4.5 g/dL (ref 3.5–5.5)
ALK PHOS: 80 IU/L (ref 39–117)
ALT: 16 IU/L (ref 0–44)
AST: 19 IU/L (ref 0–40)
Albumin/Globulin Ratio: 2 (ref 1.2–2.2)
BUN / CREAT RATIO: 15 (ref 9–20)
BUN: 15 mg/dL (ref 6–24)
CHLORIDE: 100 mmol/L (ref 96–106)
CO2: 26 mmol/L (ref 20–29)
Calcium: 9.1 mg/dL (ref 8.7–10.2)
Creatinine, Ser: 1.02 mg/dL (ref 0.76–1.27)
GFR calc non Af Amer: 89 mL/min/{1.73_m2} (ref >59)
GFR, EST AFRICAN AMERICAN: 103 mL/min/{1.73_m2} (ref >59)
GLOBULIN, TOTAL: 2.3 g/dL (ref 1.5–4.5)
Glucose: 94 mg/dL (ref 65–99)
Potassium: 4.4 mmol/L (ref 3.5–5.2)
Sodium: 137 mmol/L (ref 134–144)
Total Protein: 6.8 g/dL (ref 6.0–8.5)

## 2018-06-07 LAB — CBC WITH DIFFERENTIAL/PLATELET
BASOS: 1 %
Basophils Absolute: 0.1 10*3/uL (ref 0.0–0.2)
EOS (ABSOLUTE): 0.5 10*3/uL — ABNORMAL HIGH (ref 0.0–0.4)
EOS: 4 %
HEMATOCRIT: 39.4 % (ref 37.5–51.0)
HEMOGLOBIN: 14.4 g/dL (ref 13.0–17.7)
Immature Grans (Abs): 0.1 10*3/uL (ref 0.0–0.1)
Immature Granulocytes: 1 %
LYMPHS ABS: 3.2 10*3/uL — AB (ref 0.7–3.1)
Lymphs: 27 %
MCH: 31.5 pg (ref 26.6–33.0)
MCHC: 36.5 g/dL — AB (ref 31.5–35.7)
MCV: 86 fL (ref 79–97)
MONOS ABS: 0.6 10*3/uL (ref 0.1–0.9)
Monocytes: 5 %
NEUTROS ABS: 7.6 10*3/uL — AB (ref 1.4–7.0)
Neutrophils: 62 %
Platelets: 287 10*3/uL (ref 150–450)
RBC: 4.57 x10E6/uL (ref 4.14–5.80)
RDW: 13.1 % (ref 12.3–15.4)
WBC: 12 10*3/uL — AB (ref 3.4–10.8)

## 2018-06-07 LAB — LIPID PANEL
CHOL/HDL RATIO: 5.1 ratio — AB (ref 0.0–5.0)
Cholesterol, Total: 178 mg/dL (ref 100–199)
HDL: 35 mg/dL — AB (ref >39)
LDL Calculated: 74 mg/dL (ref 0–99)
TRIGLYCERIDES: 344 mg/dL — AB (ref 0–149)
VLDL Cholesterol Cal: 69 mg/dL — ABNORMAL HIGH (ref 5–40)

## 2018-06-07 LAB — TSH: TSH: 1.96 u[IU]/mL (ref 0.450–4.500)

## 2018-06-07 LAB — URIC ACID: Uric Acid: 6 mg/dL (ref 3.7–8.6)

## 2018-06-07 LAB — HEMOGLOBIN A1C
Est. average glucose Bld gHb Est-mCnc: 108 mg/dL
HEMOGLOBIN A1C: 5.4 % (ref 4.8–5.6)

## 2018-06-25 ENCOUNTER — Telehealth: Payer: Self-pay | Admitting: Pharmacist

## 2018-06-25 NOTE — Telephone Encounter (Signed)
06/25/2018 9:40:47 AM - Ventolin & Colcrys Pending  06/25/2018 Patient has returned applications that were mailed to him 04/11/2018, the scripts were previously signed by provider also in Oct.. I have today reprinted the scripts and application portion for the provider to sign with a current date. This is for Ventolin HFA 74mcg & Colcrys 0.6mg .Delos Haring

## 2018-07-11 ENCOUNTER — Ambulatory Visit: Payer: Self-pay | Admitting: Pharmacy Technician

## 2018-07-11 ENCOUNTER — Ambulatory Visit
Admission: RE | Admit: 2018-07-11 | Discharge: 2018-07-11 | Disposition: A | Discharge status: Home / Self Care | Payer: Self-pay | Source: Ambulatory Visit | Attending: Urology | Admitting: Urology

## 2018-07-11 ENCOUNTER — Telehealth: Payer: Self-pay | Admitting: Pharmacy Technician

## 2018-07-11 ENCOUNTER — Ambulatory Visit: Payer: Self-pay | Admitting: Family Medicine

## 2018-07-11 VITALS — BP 140/87 | HR 87 | Temp 98.1°F | Ht 69.5 in | Wt 228.8 lb

## 2018-07-11 DIAGNOSIS — R1033 Periumbilical pain: Secondary | ICD-10-CM

## 2018-07-11 DIAGNOSIS — K429 Umbilical hernia without obstruction or gangrene: Secondary | ICD-10-CM

## 2018-07-11 DIAGNOSIS — Z79899 Other long term (current) drug therapy: Secondary | ICD-10-CM

## 2018-07-11 DIAGNOSIS — J45909 Unspecified asthma, uncomplicated: Secondary | ICD-10-CM

## 2018-07-11 MED ORDER — ALLOPURINOL 100 MG PO TABS: 100.0000 mg | ORAL_TABLET | Freq: Every day | ORAL | 1 refills | Status: DC

## 2018-07-11 MED ORDER — OMEPRAZOLE 20 MG PO CPDR: 20.0000 mg | DELAYED_RELEASE_CAPSULE | Freq: Every day | ORAL | 5 refills | Status: DC

## 2018-07-11 MED ORDER — ALBUTEROL SULFATE HFA 108 (90 BASE) MCG/ACT IN AERS: 2.0000 | INHALATION_SPRAY | Freq: Four times a day (QID) | RESPIRATORY_TRACT | 5 refills | Status: DC | PRN

## 2018-07-11 NOTE — Progress Notes (Signed)
   Subjective:    Patient ID: BARD HAUPERT, male    DOB: [birth year], 43 y.o.   MRN: [id number]  HPI  Patient is a 43 year old male who presents to follow up on persistent periumbilical abdominal pain. Abdominal ultrasound completed today (07/11/2018). Patient denies any recent alcohol consumption. Pt states that Pepto-Bismol relieves his symptoms. Pt has d/ced soda consumption and monitoring diet, attempting to avoid greasy foods.    Review of Systems  Constitutional: Negative.   HENT: Negative.   Respiratory: Negative.   Cardiovascular: Negative.  Negative for leg swelling.  Gastrointestinal: Positive for abdominal pain. Negative for anal bleeding, blood in stool, constipation, diarrhea, nausea, rectal pain and vomiting.  Musculoskeletal: Negative.        Objective:   Physical Exam Constitutional:      Appearance: Normal appearance.  Cardiovascular:     Rate and Rhythm: Normal rate and regular rhythm.     Pulses: Normal pulses.     Heart sounds: Normal heart sounds.  Pulmonary:     Effort: Pulmonary effort is normal.     Breath sounds: Normal breath sounds.  Abdominal:     General: Bowel sounds are normal. There is no distension.     Tenderness: There is abdominal tenderness.             Assessment & Plan:   1. Periumbilical abdominal pain  Pain has persisted since last visit. Abdominal ultrasound showed mild hepatic steatosis but otherwise normal.   Order Today: - omeprazole (PRILOSEC) 20 MG capsule; Take 1 capsule (20 mg total) by mouth daily.  Dispense: 30 capsule; Refill: 5  Recheck Today: - CBC - Comprehensive metabolic panel - Lipase  2. Umbilical hernia without obstruction and without gangrene  Ambulatory referral to General Surgery  3.GERD/Gastritis  I have done the exam and reviewed the chart and it is accurate to the best of my knowledge. Development worker, community has been used and  any errors in dictation or transcription are  unintentional. Miguel Aschoff M.D [place]

## 2018-07-11 NOTE — Telephone Encounter (Signed)
Received updated proof of income.  Patient eligible to receive medication assistance at [facility] as long as eligibility requirements continue to be met.  Leyan Branden J. Farrel Guimond Care Manager [facility] 

## 2018-07-12 ENCOUNTER — Other Ambulatory Visit: Payer: Self-pay | Admitting: Adult Health Nurse Practitioner

## 2018-07-12 DIAGNOSIS — I10 Essential (primary) hypertension: Secondary | ICD-10-CM

## 2018-07-12 LAB — CBC
Hematocrit: 40.9 % (ref 37.5–51.0)
Hemoglobin: 14.4 g/dL (ref 13.0–17.7)
MCH: 31.2 pg (ref 26.6–33.0)
MCHC: 35.2 g/dL (ref 31.5–35.7)
MCV: 89 fL (ref 79–97)
Platelets: 298 10*3/uL (ref 150–450)
RBC: 4.62 x10E6/uL (ref 4.14–5.80)
RDW: 13.5 % (ref 11.6–15.4)
WBC: 9.8 10*3/uL (ref 3.4–10.8)

## 2018-07-12 LAB — COMPREHENSIVE METABOLIC PANEL
ALT: 18 IU/L (ref 0–44)
AST: 17 IU/L (ref 0–40)
Albumin/Globulin Ratio: 1.6 (ref 1.2–2.2)
Albumin: 4.4 g/dL (ref 4.0–5.0)
Alkaline Phosphatase: 79 IU/L (ref 39–117)
BUN/Creatinine Ratio: 28 — ABNORMAL HIGH (ref 9–20)
BUN: 20 mg/dL (ref 6–24)
Bilirubin Total: <0.2 mg/dL (ref 0.0–1.2)
CALCIUM: 9.8 mg/dL (ref 8.7–10.2)
CO2: 23 mmol/L (ref 20–29)
CREATININE: 0.72 mg/dL — AB (ref 0.76–1.27)
Chloride: 101 mmol/L (ref 96–106)
GFR calc Af Amer: 131 mL/min/{1.73_m2} (ref >59)
GFR, EST NON AFRICAN AMERICAN: 113 mL/min/{1.73_m2} (ref >59)
GLOBULIN, TOTAL: 2.8 g/dL (ref 1.5–4.5)
Glucose: 100 mg/dL — ABNORMAL HIGH (ref 65–99)
POTASSIUM: 5.1 mmol/L (ref 3.5–5.2)
Sodium: 142 mmol/L (ref 134–144)
Total Protein: 7.2 g/dL (ref 6.0–8.5)

## 2018-07-12 LAB — LIPASE: Lipase: 18 U/L (ref 13–78)

## 2018-07-12 NOTE — Progress Notes (Signed)
Completed recertification.  Patient approved to receive medication assistance at [facility] as long as eligibility criteria continues to be met.  [facility]

## 2018-07-17 ENCOUNTER — Telehealth: Payer: Self-pay | Admitting: Pharmacist

## 2018-07-17 NOTE — Telephone Encounter (Signed)
07/17/2018 2:11:57 PM - Colcrys  07/17/2018 Faxed [employer] application for enrollment for Colcrys 0.6mg  Take one tablet by mouth 2 times a day.AJ   07/17/2018 2:11:15 PM - Ventolin  [id number] Faxed [employer] application for enrollment for Ventolin HFA 23mcg Inhale 2 puffs into the lungs every 6 hours as needed for wheezing or shortness of breath.Delos Haring

## 2018-07-18 ENCOUNTER — Ambulatory Visit (INDEPENDENT_AMBULATORY_CARE_PROVIDER_SITE_OTHER): Payer: Self-pay | Admitting: Surgery

## 2018-07-18 ENCOUNTER — Encounter: Payer: Self-pay | Admitting: Surgery

## 2018-07-18 ENCOUNTER — Other Ambulatory Visit: Payer: Self-pay

## 2018-07-18 ENCOUNTER — Encounter: Payer: Self-pay | Admitting: *Deleted

## 2018-07-18 VITALS — BP 144/97 | HR 91 | Temp 97.9°F | Ht 69.5 in | Wt 226.6 lb

## 2018-07-18 DIAGNOSIS — K429 Umbilical hernia without obstruction or gangrene: Secondary | ICD-10-CM

## 2018-07-18 NOTE — Progress Notes (Signed)
Patient's surgery to be scheduled for 07-26-18 at [facility] with Dr. Rosana Hoes.  The patient is aware he will be contacted by the [facility] to complete a phone interview sometime in the near future.  The patient is aware to call the office should he have further questions.   *Patient is currently trying to obtain Financial Assistance. Message to Angie to  mail a cost estimate to the patient. He is aware that we would like to get a down payment on surgery if Financial Assistance does not get approved.

## 2018-07-18 NOTE — Patient Instructions (Addendum)
Patient will need to be scheduled for surgery with Dr.Davis. Go to any [employer] and pick up an abdominal binder.   Call the office with any questions or concerns.     Umbilical Hernia, Adult  A hernia is a bulge of tissue that pushes through an opening between muscles. An umbilical hernia happens in the abdomen, near the belly button (umbilicus). The hernia may contain tissues from the small intestine, large intestine, or fatty tissue covering the intestines (omentum). Umbilical hernias in adults tend to get worse over time, and they require surgical treatment. There are several types of umbilical hernias. You may have:  A hernia located just above or below the umbilicus (indirect hernia). This is the most common type of umbilical hernia in adults.  A hernia that forms through an opening formed by the umbilicus (direct hernia).  A hernia that comes and goes (reducible hernia). A reducible hernia may be visible only when you strain, lift something heavy, or cough. This type of hernia can be pushed back into the abdomen (reduced).  A hernia that traps abdominal tissue inside the hernia (incarcerated hernia). This type of hernia cannot be reduced.  A hernia that cuts off blood flow to the tissues inside the hernia (strangulated hernia). The tissues can start to die if this happens. This type of hernia requires emergency treatment. What are the causes? An umbilical hernia happens when tissue inside the abdomen presses on a weak area of the abdominal muscles. What increases the risk? You may have a greater risk of this condition if you:  Are obese.  Have had several pregnancies.  Have a buildup of fluid inside your abdomen (ascites).  Have had surgery that weakens the abdominal muscles. What are the signs or symptoms? The main symptom of this condition is a painless bulge at or near the belly button. A reducible hernia may be visible only when you strain, lift something heavy, or cough.  Other symptoms may include:  Dull pain.  A feeling of pressure. Symptoms of a strangulated hernia may include:  Pain that gets increasingly worse.  Nausea and vomiting.  Pain when pressing on the hernia.  Skin over the hernia becoming red or purple.  Constipation.  Blood in the stool. How is this diagnosed? This condition may be diagnosed based on:  A physical exam. You may be asked to cough or strain while standing. These actions increase the pressure inside your abdomen and force the hernia through the opening in your muscles. Your health care provider may try to reduce the hernia by pressing on it.  Your symptoms and medical history. How is this treated? Surgery is the only treatment for an umbilical hernia. Surgery for a strangulated hernia is done as soon as possible. If you have a small hernia that is not incarcerated, you may need to lose weight before having surgery. Follow these instructions at home:  Lose weight, if told by your health care provider.  Do not try to push the hernia back in.  Watch your hernia for any changes in color or size. Tell your health care provider if any changes occur.  You may need to avoid activities that increase pressure on your hernia.  Do not lift anything that is heavier than 10 lb (4.5 kg) until your health care provider says that this is safe.  Take over-the-counter and prescription medicines only as told by your health care provider.  Keep all follow-up visits as told by your health care provider. This is  important. Contact a health care provider if:  Your hernia gets larger.  Your hernia becomes painful. Get help right away if:  You develop sudden, severe pain near the area of your hernia.  You have pain as well as nausea or vomiting.  You have pain and the skin over your hernia changes color.  You develop a fever. This information is not intended to replace advice given to you by your health care provider. Make sure  you discuss any questions you have with your health care provider. Document Released: 11/05/2015 Document Revised: 07/18/2017 Document Reviewed: 12/04/2016 Elsevier Interactive Patient Education  2019 [employer].

## 2018-07-18 NOTE — Progress Notes (Signed)
Surgical Clinic History and Physical  Referring provider:  Doles-Johnson, Teah, NP [address], [place] [address]  HISTORY OF PRESENT ILLNESS (HPI):  43 y.o. male presents for evaluation of his umbilical hernia. Patient reports he was carrying bags of concrete 6 months ago when he felt something pop or tear, followed by pain. He thought at the time that he'd "pulled a muscle", but he then shortly thereafter noticed a bulge at his umbilicus that has become increasingly painful with lifting 35 - 60 lb bags and coughing in particular. 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 Patient otherwise reports nausea x past 3 months or so, associated with his peri-umbilical pain, though denies constipation, straining with urination, or blood per rectum and describes himself as "active" and easily able to walk several blocks or up/down flights of steps without CP or SOB.  PAST MEDICAL HISTORY (PMH):  Past Medical History:  Diagnosis Date  . Gout   . Hypertension     PAST SURGICAL HISTORY ([place]):  Past Surgical History:  Procedure Laterality Date  . CYST EXCISION     Patient states he was 43 years old    MEDICATIONS:  Prior to Admission medications   Medication Sig Start Date End Date Taking? Authorizing Provider  acetaminophen (TYLENOL) 500 MG tablet Take 1,000 mg by mouth daily as needed for moderate pain.   Yes [provider]  albuterol (PROVENTIL HFA;VENTOLIN HFA) 108 (90 Base) MCG/ACT inhaler Inhale 2 puffs into the lungs every 6 (six) hours as needed for wheezing or shortness of breath. 07/11/18  Yes Jerrol Banana., MD  allopurinol (ZYLOPRIM) 100 MG tablet Take 1 tablet (100 mg total) by mouth daily. 07/11/18  Yes Jerrol Banana., MD  amLODipine (NORVASC) 5 MG tablet TAKE ONE TABLET BY MOUTH EVERY DAY 07/12/18  Yes  Doles-Johnson, Teah, NP  lisinopril (PRINIVIL,ZESTRIL) 10 MG tablet Take 1 tablet (10 mg total) by mouth daily. 04/11/18  Yes Johnson, Megan P, DO  omeprazole (PRILOSEC) 20 MG capsule Take 1 capsule (20 mg total) by mouth daily. 07/11/18  Yes Jerrol Banana., MD    ALLERGIES:  No Known Allergies   SOCIAL HISTORY:  Social History   Socioeconomic History  . Marital status: Divorced    Spouse name: Not on file  . Number of children: 1  . Years of education: Not on file  . Highest education level: Not on file  Occupational History  . Not on file  Social Needs  . Financial resource strain: Not hard at all  . Food insecurity:    Worry: Never true    Inability: Never true  . Transportation needs:    Medical: No    Non-medical: No  Tobacco Use  . Smoking status: Current Every Day Smoker    Packs/day: 1.00    Types: Cigarettes  . Smokeless tobacco: Current User    Types: Chew  Substance and Sexual Activity  . Alcohol use: Not Currently    Comment: occasional  . Drug use: No  . Sexual activity: Not Currently  Lifestyle  . Physical activity:    Days per week: 4 days    Minutes per session: 150+ min  . Stress: Very much  Relationships  . Social connections:    Talks on phone: More than three times a week    Gets together: More  than three times a week    Attends religious service: 1 to 4 times per year    Active member of club or organization: No    Attends meetings of clubs or organizations: Never    Relationship status: Divorced  . Intimate partner violence:    Fear of current or ex partner: No    Emotionally abused: No    Physically abused: No    Forced sexual activity: No  Other Topics Concern  . Not on file  Social History Narrative  . Not on file    The patient currently resides (home / rehab facility / nursing home): Home The patient normally is (ambulatory / bedbound): Ambulatory  FAMILY HISTORY:  Family History  Problem Relation Age of Onset  . Lung  cancer Father     Otherwise negative/non-contributory.  REVIEW OF SYSTEMS:  Constitutional: denies any other weight loss, fever, chills, or sweats  Eyes: denies any other vision changes, history of eye injury  ENT: denies sore throat, hearing problems  Respiratory: denies shortness of breath, wheezing  Cardiovascular: denies chest pain, palpitations  Gastrointestinal: abdominal pain, N/V, and bowel function as per HPI Musculoskeletal: denies any other joint pains or cramps  Skin: Denies any other rashes or skin discolorations Neurological: denies any other headache, dizziness, weakness  Psychiatric: Denies any other depression, anxiety   All other review of systems were otherwise negative   VITAL SIGNS:  BP (!) 144/97   Pulse 91   Temp 97.9 F (36.6 C) (Temporal)   Ht 5' 9.5" (1.765 m)   Wt 226 lb 9.6 oz (102.8 kg)   SpO2 94%   BMI 32.98 kg/m   PHYSICAL EXAM:  Constitutional:  -- Normal body habitus  -- Awake, alert, and oriented x3  Eyes:  -- Pupils equally round and reactive to light  -- No scleral icterus  Ear, nose, throat:  -- No jugular venous distension -- No nasal drainage, bleeding Pulmonary:  -- No crackles  -- Equal breath sounds bilaterally -- Breathing non-labored at rest Cardiovascular:  -- S1, S2 present  -- No pericardial rubs  Gastrointestinal:  -- Abdomen soft and non-distended with no guarding/rebound tenderness -- Moderately tender to palpation somewhat/partially reducible umbilical hernia -- No other abdominal masses appreciated, pulsatile or otherwise  Musculoskeletal and Integumentary:  -- Wounds or skin discoloration: None appreciated -- Extremities: B/L UE and LE FROM, hands and feet warm, no edema  Neurologic:  -- Motor function: Intact and symmetric -- Sensation: Intact and symmetric  Labs:  CBC Latest Ref Rng & Units 07/11/2018 06/06/2018 01/15/2018  WBC 3.4 - 10.8 x10E3/uL 9.8 12.0(H) 9.5  Hemoglobin 13.0 - 17.7 g/dL 14.4 14.4  12.9(L)  Hematocrit 37.5 - 51.0 % 40.9 39.4 38.6  Platelets 150 - 450 x10E3/uL 298 287 253   CMP Latest Ref Rng & Units 07/11/2018 06/06/2018 01/31/2018  Glucose 65 - 99 mg/dL 100(H) 94 101(H)  BUN 6 - 24 mg/dL 20 15 17   Creatinine 0.76 - 1.27 mg/dL 0.72(L) 1.02 0.75(L)  Sodium 134 - 144 mmol/L 142 137 138  Potassium 3.5 - 5.2 mmol/L 5.1 4.4 4.7  Chloride 96 - 106 mmol/L 101 100 99  CO2 20 - 29 mmol/L 23 26 24   Calcium 8.7 - 10.2 mg/dL 9.8 9.1 10.5(H)  Total Protein 6.0 - 8.5 g/dL 7.2 6.8 -  Total Bilirubin 0.0 - 1.2 mg/dL <0.2 <0.2 -  Alkaline Phos 39 - 117 IU/L 79 80 -  AST 0 - 40 IU/L 17 19 -  ALT 0 - 44 IU/L 18 16 -   Imaging studies:  Abdominal Ultrasound (07/11/2018) Gallbladder: No gallstones or wall thickening visualized. No sonographic Murphy sign noted by sonographer.  Common bile duct: Diameter: 3 mm, within normal limits.  Liver: Mildly increased echogenicity of the hepatic parenchyma, consistent with hepatic steatosis. No hepatic mass identified. Portal vein is patent on color Doppler imaging with normal direction of blood flow towards the liver.  IVC: No abnormality visualized.  Pancreas: Visualized portion unremarkable.  Spleen: Size and appearance within normal limits.  Right Kidney: Length: 12.3 cm. Echogenicity within normal limits. No mass or hydronephrosis visualized.  Left Kidney: Length: 12.8 cm. Echogenicity within normal limits. No mass or hydronephrosis visualized.  Abdominal aorta: No aneurysm visualized.  Assessment/Plan: 43 y.o. male with increasingly symptomatic (painful) reducible umbilical hernia, complicated by pertinent comorbidities including HTN, gout, and chronic ongoing tobacco abuse with frequent non-productive cough.               - discussed with patient signs and symptoms of hernia incarceration and obstruction             - strategies for manual self-reduction of patient's hernia also reviewed and discussed  - maintain  hydration with high fiber heart healthy diet to reduce/minimize constipation +/- daily stool softener as needed             - all risks, benefits, alternatives to, and anticipated restrictions/recovery following open repair of increasingly symptomatic (painful) umbilical hernia with mesh were discussed with the patient, all of his questions were answered to patient's expressed satisfaction, patient expresses he wishes to proceed, and informed consent was obtained.             - will plan for open repair of patient's increasingly symptomatic (painful) umbilical hernia with mesh, preferably on a Monday over next week or two per patient request pending anesthesia and OR availability  - consider abdominal binder for relief, both pre- and post-surgery, particularly considering patient's work and coughing  - smoking cessation strongly encouraged, both to reduce peri-op complications and for long-term health             - anticipate return to clinic 2 weeks after above planned surgery             - instructed to call if any questions or concerns  All of the above recommendations were discussed with the patient, and all of patient's questions were answered to his expressed satisfaction.  Thank you for the opportunity to participate in this patient's care.  -- Marilynne Drivers Rosana Hoes, MD, [place]: [facility] - Partnering for exceptional care. Office: [phone]

## 2018-07-18 NOTE — H&P (View-Only) (Signed)
Surgical Clinic History and Physical  Referring provider:  Doles-Johnson, Teah, NP [address], [place] [address]  HISTORY OF PRESENT ILLNESS (HPI):  43 y.o. male presents for evaluation of his umbilical hernia. Patient reports he was carrying bags of concrete 6 months ago when he felt something pop or tear, followed by pain. He thought at the time that he'd "pulled a muscle", but he then shortly thereafter noticed a bulge at his umbilicus that has become increasingly painful with lifting 35 - 60 lb bags and coughing in particular. 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 Patient otherwise reports nausea x past 3 months or so, associated with his peri-umbilical pain, though denies constipation, straining with urination, or blood per rectum and describes himself as "active" and easily able to walk several blocks or up/down flights of steps without CP or SOB.  PAST MEDICAL HISTORY (PMH):  Past Medical History:  Diagnosis Date  . Gout   . Hypertension     PAST SURGICAL HISTORY ([place]):  Past Surgical History:  Procedure Laterality Date  . CYST EXCISION     Patient states he was 43 years old    MEDICATIONS:  Prior to Admission medications   Medication Sig Start Date End Date Taking? Authorizing Provider  acetaminophen (TYLENOL) 500 MG tablet Take 1,000 mg by mouth daily as needed for moderate pain.   Yes [provider]  albuterol (PROVENTIL HFA;VENTOLIN HFA) 108 (90 Base) MCG/ACT inhaler Inhale 2 puffs into the lungs every 6 (six) hours as needed for wheezing or shortness of breath. 07/11/18  Yes Jerrol Banana., MD  allopurinol (ZYLOPRIM) 100 MG tablet Take 1 tablet (100 mg total) by mouth daily. 07/11/18  Yes Jerrol Banana., MD  amLODipine (NORVASC) 5 MG tablet TAKE ONE TABLET BY MOUTH EVERY DAY 07/12/18  Yes  Doles-Johnson, Teah, NP  lisinopril (PRINIVIL,ZESTRIL) 10 MG tablet Take 1 tablet (10 mg total) by mouth daily. 04/11/18  Yes Johnson, Megan P, DO  omeprazole (PRILOSEC) 20 MG capsule Take 1 capsule (20 mg total) by mouth daily. 07/11/18  Yes Jerrol Banana., MD    ALLERGIES:  No Known Allergies   SOCIAL HISTORY:  Social History   Socioeconomic History  . Marital status: Divorced    Spouse name: Not on file  . Number of children: 1  . Years of education: Not on file  . Highest education level: Not on file  Occupational History  . Not on file  Social Needs  . Financial resource strain: Not hard at all  . Food insecurity:    Worry: Never true    Inability: Never true  . Transportation needs:    Medical: No    Non-medical: No  Tobacco Use  . Smoking status: Current Every Day Smoker    Packs/day: 1.00    Types: Cigarettes  . Smokeless tobacco: Current User    Types: Chew  Substance and Sexual Activity  . Alcohol use: Not Currently    Comment: occasional  . Drug use: No  . Sexual activity: Not Currently  Lifestyle  . Physical activity:    Days per week: 4 days    Minutes per session: 150+ min  . Stress: Very much  Relationships  . Social connections:    Talks on phone: More than three times a week    Gets together: More  than three times a week    Attends religious service: 1 to 4 times per year    Active member of club or organization: No    Attends meetings of clubs or organizations: Never    Relationship status: Divorced  . Intimate partner violence:    Fear of current or ex partner: No    Emotionally abused: No    Physically abused: No    Forced sexual activity: No  Other Topics Concern  . Not on file  Social History Narrative  . Not on file    The patient currently resides (home / rehab facility / nursing home): Home The patient normally is (ambulatory / bedbound): Ambulatory  FAMILY HISTORY:  Family History  Problem Relation Age of Onset  . Lung  cancer Father     Otherwise negative/non-contributory.  REVIEW OF SYSTEMS:  Constitutional: denies any other weight loss, fever, chills, or sweats  Eyes: denies any other vision changes, history of eye injury  ENT: denies sore throat, hearing problems  Respiratory: denies shortness of breath, wheezing  Cardiovascular: denies chest pain, palpitations  Gastrointestinal: abdominal pain, N/V, and bowel function as per HPI Musculoskeletal: denies any other joint pains or cramps  Skin: Denies any other rashes or skin discolorations Neurological: denies any other headache, dizziness, weakness  Psychiatric: Denies any other depression, anxiety   All other review of systems were otherwise negative   VITAL SIGNS:  BP (!) 144/97   Pulse 91   Temp 97.9 F (36.6 C) (Temporal)   Ht 5' 9.5" (1.765 m)   Wt 226 lb 9.6 oz (102.8 kg)   SpO2 94%   BMI 32.98 kg/m   PHYSICAL EXAM:  Constitutional:  -- Normal body habitus  -- Awake, alert, and oriented x3  Eyes:  -- Pupils equally round and reactive to light  -- No scleral icterus  Ear, nose, throat:  -- No jugular venous distension -- No nasal drainage, bleeding Pulmonary:  -- No crackles  -- Equal breath sounds bilaterally -- Breathing non-labored at rest Cardiovascular:  -- S1, S2 present  -- No pericardial rubs  Gastrointestinal:  -- Abdomen soft and non-distended with no guarding/rebound tenderness -- Moderately tender to palpation somewhat/partially reducible umbilical hernia -- No other abdominal masses appreciated, pulsatile or otherwise  Musculoskeletal and Integumentary:  -- Wounds or skin discoloration: None appreciated -- Extremities: B/L UE and LE FROM, hands and feet warm, no edema  Neurologic:  -- Motor function: Intact and symmetric -- Sensation: Intact and symmetric  Labs:  CBC Latest Ref Rng & Units 07/11/2018 06/06/2018 01/15/2018  WBC 3.4 - 10.8 x10E3/uL 9.8 12.0(H) 9.5  Hemoglobin 13.0 - 17.7 g/dL 14.4 14.4  12.9(L)  Hematocrit 37.5 - 51.0 % 40.9 39.4 38.6  Platelets 150 - 450 x10E3/uL 298 287 253   CMP Latest Ref Rng & Units 07/11/2018 06/06/2018 01/31/2018  Glucose 65 - 99 mg/dL 100(H) 94 101(H)  BUN 6 - 24 mg/dL 20 15 17   Creatinine 0.76 - 1.27 mg/dL 0.72(L) 1.02 0.75(L)  Sodium 134 - 144 mmol/L 142 137 138  Potassium 3.5 - 5.2 mmol/L 5.1 4.4 4.7  Chloride 96 - 106 mmol/L 101 100 99  CO2 20 - 29 mmol/L 23 26 24   Calcium 8.7 - 10.2 mg/dL 9.8 9.1 10.5(H)  Total Protein 6.0 - 8.5 g/dL 7.2 6.8 -  Total Bilirubin 0.0 - 1.2 mg/dL <0.2 <0.2 -  Alkaline Phos 39 - 117 IU/L 79 80 -  AST 0 - 40 IU/L 17 19 -  ALT 0 - 44 IU/L 18 16 -   Imaging studies:  Abdominal Ultrasound (07/11/2018) Gallbladder: No gallstones or wall thickening visualized. No sonographic Murphy sign noted by sonographer.  Common bile duct: Diameter: 3 mm, within normal limits.  Liver: Mildly increased echogenicity of the hepatic parenchyma, consistent with hepatic steatosis. No hepatic mass identified. Portal vein is patent on color Doppler imaging with normal direction of blood flow towards the liver.  IVC: No abnormality visualized.  Pancreas: Visualized portion unremarkable.  Spleen: Size and appearance within normal limits.  Right Kidney: Length: 12.3 cm. Echogenicity within normal limits. No mass or hydronephrosis visualized.  Left Kidney: Length: 12.8 cm. Echogenicity within normal limits. No mass or hydronephrosis visualized.  Abdominal aorta: No aneurysm visualized.  Assessment/Plan: 43 y.o. male with increasingly symptomatic (painful) reducible umbilical hernia, complicated by pertinent comorbidities including HTN, gout, and chronic ongoing tobacco abuse with frequent non-productive cough.               - discussed with patient signs and symptoms of hernia incarceration and obstruction             - strategies for manual self-reduction of patient's hernia also reviewed and discussed  - maintain  hydration with high fiber heart healthy diet to reduce/minimize constipation +/- daily stool softener as needed             - all risks, benefits, alternatives to, and anticipated restrictions/recovery following open repair of increasingly symptomatic (painful) umbilical hernia with mesh were discussed with the patient, all of his questions were answered to patient's expressed satisfaction, patient expresses he wishes to proceed, and informed consent was obtained.             - will plan for open repair of patient's increasingly symptomatic (painful) umbilical hernia with mesh, preferably on a Monday over next week or two per patient request pending anesthesia and OR availability  - consider abdominal binder for relief, both pre- and post-surgery, particularly considering patient's work and coughing  - smoking cessation strongly encouraged, both to reduce peri-op complications and for long-term health             - anticipate return to clinic 2 weeks after above planned surgery             - instructed to call if any questions or concerns  All of the above recommendations were discussed with the patient, and all of patient's questions were answered to his expressed satisfaction.  Thank you for the opportunity to participate in this patient's care.  -- Marilynne Drivers Rosana Hoes, MD, [place]: [facility] - Partnering for exceptional care. Office: [phone]

## 2018-07-24 ENCOUNTER — Encounter
Admission: RE | Admit: 2018-07-24 | Discharge: 2018-07-24 | Disposition: A | Discharge status: Home / Self Care | Payer: Self-pay | Source: Ambulatory Visit | Attending: Surgery | Admitting: Surgery

## 2018-07-24 ENCOUNTER — Other Ambulatory Visit: Payer: Self-pay

## 2018-07-24 HISTORY — DX: Gastro-esophageal reflux disease without esophagitis: K21.9

## 2018-07-24 NOTE — Patient Instructions (Addendum)
Your procedure is scheduled on: 07-26-18 FRIDAY Report to Same Day Surgery 2nd floor medical mall [facility] Entrance-take elevator on left to 2nd floor.  Check in with surgery information desk.) To find out your arrival time please call [phone] between 1PM - 3PM on 07-25-18 THURSDAY  Remember: Instructions that are not followed completely may result in serious medical risk, up to and including death, or upon the discretion of your surgeon and anesthesiologist your surgery may need to be rescheduled.    _x___ 1. Do not eat food after midnight the night before your procedure. NO GUM OR CANDY AFTER MIDNIGHT. You may drink clear liquids up to 2 hours before you are scheduled to arrive at the hospital for your procedure.  Do not drink clear liquids within 2 hours of your scheduled arrival to the hospital.  Clear liquids include  --Water or Apple juice without pulp  --Clear carbohydrate beverage such as ClearFast or Gatorade  --Black Coffee or Clear Tea (No milk, no creamers, do not add anything to the coffee or Tea   ____Ensure clear carbohydrate drink on the way to the hospital for bariatric patients  ____Ensure clear carbohydrate drink 3 hours before surgery for Dr Dwyane Luo patients if physician instructed.     __x__ 2. No Alcohol for 24 hours before or after surgery.   __x__3. No Smoking or e-cigarettes for 24 prior to surgery.  Do not use any chewable tobacco products for at least 6 hour prior to surgery   ____  4. Bring all medications with you on the day of surgery if instructed.    __x__ 5. Notify your doctor if there is any change in your medical condition     (cold, fever, infections).    x___6. On the morning of surgery brush your teeth with toothpaste and water.  You may rinse your mouth with mouth wash if you wish.  Do not swallow any toothpaste or mouthwash.   Do not wear jewelry, make-up, hairpins, clips or nail polish.  Do not wear lotions, powders, or perfumes. You  may wear deodorant.  Do not shave 48 hours prior to surgery. Men may shave face and neck.  Do not bring valuables to the hospital.    [facility] is not responsible for any belongings or valuables.               Contacts, dentures or bridgework may not be worn into surgery.  Leave your suitcase in the car. After surgery it may be brought to your room.  For patients admitted to the hospital, discharge time is determined by your treatment team.  _  Patients discharged the day of surgery will not be allowed to drive home.  You will need someone to drive you home and stay with you the night of your procedure.    Please read over the following fact sheets that you were given:   [facility] Preparing for Surgery   _x___ TAKE THE FOLLOWING MEDICATION THE MORNING OF SURGERY WITH A SMALL SIP OF WATER. These include:  1. ALLOPURINOL  2. AMLODIPINE   3. PRILOSEC  4. TAKE AN EXTRA PRILOSEC THE NIGHT BEFORE YOUR SURGERY  5.  6.  ____Fleets enema or Magnesium Citrate as directed.   _x___ Use CHG Soap or sage wipes as directed on instruction sheet   _X___ Use inhalers on the day of surgery and bring to hospital day of surgery-USE YOUR ALBUTEROL INHALER DAY OF SURGERY AND [place]  ____ Stop  Metformin and Janumet 2 days prior to surgery.    ____ Take 1/2 of usual insulin dose the night before surgery and none on the morning surgery.   ____ Follow recommendations from Cardiologist, Pulmonologist or PCP regarding stopping Aspirin, Coumadin, Plavix ,Eliquis, Effient, or Pradaxa, and Pletal.  X____Stop Anti-inflammatories such as Advil, Aleve, Ibuprofen, Motrin, Naproxen, Naprosyn, Goodies powders or aspirin products NOW-OK to take Tylenol    ____ Stop supplements until after surgery.     ____ Bring C-Pap to the hospital.

## 2018-07-25 ENCOUNTER — Telehealth: Payer: Self-pay | Admitting: *Deleted

## 2018-07-25 ENCOUNTER — Encounter
Admission: RE | Admit: 2018-07-25 | Discharge: 2018-07-25 | Disposition: A | Discharge status: Home / Self Care | Payer: Self-pay | Source: Ambulatory Visit | Attending: Surgery | Admitting: Surgery

## 2018-07-25 ENCOUNTER — Other Ambulatory Visit: Payer: Self-pay

## 2018-07-25 DIAGNOSIS — Z01810 Encounter for preprocedural cardiovascular examination: Secondary | ICD-10-CM | POA: Insufficient documentation

## 2018-07-25 MED ORDER — CHLORHEXIDINE GLUCONATE CLOTH 2 % EX PADS: 6.0000 | MEDICATED_PAD | Freq: Once | CUTANEOUS | Status: DC

## 2018-07-25 MED ORDER — GABAPENTIN 300 MG PO CAPS
300.0000 mg | ORAL_CAPSULE | ORAL | Status: AC
  Administered 2018-07-26: 300 mg via ORAL

## 2018-07-25 MED ORDER — LACTATED RINGERS IV SOLN
INTRAVENOUS | Status: DC
  Administered 2018-07-26: 09:00:00 via INTRAVENOUS

## 2018-07-25 MED ORDER — CEFAZOLIN SODIUM-DEXTROSE 2-4 GM/100ML-% IV SOLN
2.0000 g | INTRAVENOUS | Status: AC
  Administered 2018-07-26: 2 g via INTRAVENOUS

## 2018-07-25 MED ORDER — ACETAMINOPHEN 500 MG PO TABS
1000.0000 mg | ORAL_TABLET | ORAL | Status: AC
  Administered 2018-07-26: 1000 mg via ORAL

## 2018-07-25 NOTE — Telephone Encounter (Signed)
-----   Message from [place]a [place], [place] sent at 07/18/2018 12:43 PM EST ----- Follow up with patient to see if he has obtained financial assistance if we have not heard back. Surgery 07-26-18 with Rosana Hoes.

## 2018-07-25 NOTE — Telephone Encounter (Signed)
Patient contacted today and states he did receive financial assistance at 100% coverage effective 07-10-18.  The patient was instructed that he should be receiving a letter and was told to bring this to his post op appointment.   Patient verbalizes understanding.   Surgery as scheduled for tomorrow with Dr. Rosana Hoes.

## 2018-07-26 ENCOUNTER — Ambulatory Visit: Payer: Self-pay | Admitting: Anesthesiology

## 2018-07-26 ENCOUNTER — Encounter
Admission: RE | Disposition: A | Discharge status: Home / Self Care | Payer: Self-pay | Source: Home / Self Care | Attending: Surgery

## 2018-07-26 ENCOUNTER — Ambulatory Visit
Admission: RE | Admit: 2018-07-26 | Discharge: 2018-07-26 | Disposition: A | Discharge status: Home / Self Care | Payer: Self-pay | Attending: Surgery | Admitting: Surgery

## 2018-07-26 ENCOUNTER — Other Ambulatory Visit: Payer: Self-pay

## 2018-07-26 DIAGNOSIS — F17210 Nicotine dependence, cigarettes, uncomplicated: Secondary | ICD-10-CM | POA: Insufficient documentation

## 2018-07-26 DIAGNOSIS — K429 Umbilical hernia without obstruction or gangrene: Secondary | ICD-10-CM

## 2018-07-26 DIAGNOSIS — I10 Essential (primary) hypertension: Secondary | ICD-10-CM | POA: Insufficient documentation

## 2018-07-26 DIAGNOSIS — M109 Gout, unspecified: Secondary | ICD-10-CM | POA: Insufficient documentation

## 2018-07-26 DIAGNOSIS — K219 Gastro-esophageal reflux disease without esophagitis: Secondary | ICD-10-CM | POA: Insufficient documentation

## 2018-07-26 HISTORY — PX: UMBILICAL HERNIA REPAIR: SHX196

## 2018-07-26 SURGERY — REPAIR, HERNIA, UMBILICAL, ADULT
Anesthesia: General

## 2018-07-26 MED ORDER — PHENYLEPHRINE HCL 10 MG/ML IJ SOLN
INTRAMUSCULAR | Status: DC | PRN
  Administered 2018-07-26 (×5): 100 ug via INTRAVENOUS
  Administered 2018-07-26: 50 ug via INTRAVENOUS
  Administered 2018-07-26 (×4): 100 ug via INTRAVENOUS
  Administered 2018-07-26: 50 ug via INTRAVENOUS

## 2018-07-26 MED ORDER — LIDOCAINE HCL (PF) 1 % IJ SOLN
INTRAMUSCULAR | Status: AC
  Filled 2018-07-26: qty 30

## 2018-07-26 MED ORDER — LIDOCAINE HCL (PF) 2 % IJ SOLN
INTRAMUSCULAR | Status: AC
  Filled 2018-07-26: qty 10

## 2018-07-26 MED ORDER — FENTANYL CITRATE (PF) 100 MCG/2ML IJ SOLN
INTRAMUSCULAR | Status: DC | PRN
  Administered 2018-07-26 (×2): 25 ug via INTRAVENOUS
  Administered 2018-07-26 (×3): 50 ug via INTRAVENOUS

## 2018-07-26 MED ORDER — GABAPENTIN 300 MG PO CAPS
ORAL_CAPSULE | ORAL | Status: AC
  Filled 2018-07-26: qty 1

## 2018-07-26 MED ORDER — MIDAZOLAM HCL 2 MG/2ML IJ SOLN
INTRAMUSCULAR | Status: DC | PRN
  Administered 2018-07-26: 2 mg via INTRAVENOUS

## 2018-07-26 MED ORDER — PROMETHAZINE HCL 25 MG/ML IJ SOLN: 6.2500 mg | INTRAMUSCULAR | Status: DC | PRN

## 2018-07-26 MED ORDER — PHENYLEPHRINE HCL 10 MG/ML IJ SOLN
INTRAMUSCULAR | Status: AC
  Filled 2018-07-26: qty 1

## 2018-07-26 MED ORDER — DEXMEDETOMIDINE HCL 200 MCG/2ML IV SOLN
INTRAVENOUS | Status: DC | PRN
  Administered 2018-07-26: 4 ug via INTRAVENOUS
  Administered 2018-07-26: 8 ug via INTRAVENOUS

## 2018-07-26 MED ORDER — OXYCODONE HCL 5 MG PO TABS
5.0000 mg | ORAL_TABLET | Freq: Once | ORAL | Status: AC | PRN
  Administered 2018-07-26: 5 mg via ORAL

## 2018-07-26 MED ORDER — MEPERIDINE HCL 50 MG/ML IJ SOLN: 6.2500 mg | INTRAMUSCULAR | Status: DC | PRN

## 2018-07-26 MED ORDER — ROCURONIUM BROMIDE 100 MG/10ML IV SOLN
INTRAVENOUS | Status: DC | PRN
  Administered 2018-07-26: 10 mg via INTRAVENOUS
  Administered 2018-07-26: 20 mg via INTRAVENOUS
  Administered 2018-07-26: 50 mg via INTRAVENOUS

## 2018-07-26 MED ORDER — ONDANSETRON HCL 4 MG/2ML IJ SOLN
INTRAMUSCULAR | Status: DC | PRN
  Administered 2018-07-26: 4 mg via INTRAVENOUS

## 2018-07-26 MED ORDER — ONDANSETRON HCL 4 MG/2ML IJ SOLN
INTRAMUSCULAR | Status: AC
  Filled 2018-07-26: qty 2

## 2018-07-26 MED ORDER — OXYCODONE HCL 5 MG PO TABS
ORAL_TABLET | ORAL | Status: AC
  Filled 2018-07-26: qty 1

## 2018-07-26 MED ORDER — MIDAZOLAM HCL 2 MG/2ML IJ SOLN
INTRAMUSCULAR | Status: AC
  Filled 2018-07-26: qty 2

## 2018-07-26 MED ORDER — FENTANYL CITRATE (PF) 100 MCG/2ML IJ SOLN
INTRAMUSCULAR | Status: AC
  Filled 2018-07-26: qty 2

## 2018-07-26 MED ORDER — ROCURONIUM BROMIDE 50 MG/5ML IV SOLN
INTRAVENOUS | Status: AC
  Filled 2018-07-26: qty 2

## 2018-07-26 MED ORDER — SUGAMMADEX SODIUM 500 MG/5ML IV SOLN
INTRAVENOUS | Status: AC
  Filled 2018-07-26: qty 5

## 2018-07-26 MED ORDER — FENTANYL CITRATE (PF) 100 MCG/2ML IJ SOLN
INTRAMUSCULAR | Status: AC
  Administered 2018-07-26: 25 ug via INTRAVENOUS
  Filled 2018-07-26: qty 2

## 2018-07-26 MED ORDER — ACETAMINOPHEN 500 MG PO TABS
ORAL_TABLET | ORAL | Status: AC
  Filled 2018-07-26: qty 2

## 2018-07-26 MED ORDER — FENTANYL CITRATE (PF) 100 MCG/2ML IJ SOLN
25.0000 ug | INTRAMUSCULAR | Status: DC | PRN
  Administered 2018-07-26 (×4): 25 ug via INTRAVENOUS

## 2018-07-26 MED ORDER — BUPIVACAINE HCL (PF) 0.5 % IJ SOLN
INTRAMUSCULAR | Status: AC
  Filled 2018-07-26: qty 30

## 2018-07-26 MED ORDER — DEXAMETHASONE SODIUM PHOSPHATE 10 MG/ML IJ SOLN
INTRAMUSCULAR | Status: DC | PRN
  Administered 2018-07-26: 10 mg via INTRAVENOUS

## 2018-07-26 MED ORDER — PROPOFOL 10 MG/ML IV BOLUS
INTRAVENOUS | Status: AC
  Filled 2018-07-26: qty 40

## 2018-07-26 MED ORDER — OXYCODONE HCL 5 MG/5ML PO SOLN: 5.0000 mg | Freq: Once | ORAL | Status: AC | PRN

## 2018-07-26 MED ORDER — DEXAMETHASONE SODIUM PHOSPHATE 10 MG/ML IJ SOLN
INTRAMUSCULAR | Status: AC
  Filled 2018-07-26: qty 1

## 2018-07-26 MED ORDER — LIDOCAINE HCL 1 % IJ SOLN
INTRAMUSCULAR | Status: DC | PRN
  Administered 2018-07-26: 20 mL via INTRAMUSCULAR

## 2018-07-26 MED ORDER — PROPOFOL 10 MG/ML IV BOLUS
INTRAVENOUS | Status: DC | PRN
  Administered 2018-07-26: 200 mg via INTRAVENOUS

## 2018-07-26 MED ORDER — SUGAMMADEX SODIUM 500 MG/5ML IV SOLN
INTRAVENOUS | Status: DC | PRN
  Administered 2018-07-26: 250 mg via INTRAVENOUS

## 2018-07-26 MED ORDER — LIDOCAINE HCL (CARDIAC) PF 100 MG/5ML IV SOSY
PREFILLED_SYRINGE | INTRAVENOUS | Status: DC | PRN
  Administered 2018-07-26: 100 mg via INTRAVENOUS

## 2018-07-26 MED ORDER — OXYCODONE-ACETAMINOPHEN 5-325 MG PO TABS: 1.0000 | ORAL_TABLET | ORAL | 0 refills | Status: DC | PRN

## 2018-07-26 MED ORDER — CEFAZOLIN SODIUM-DEXTROSE 2-4 GM/100ML-% IV SOLN
INTRAVENOUS | Status: AC
  Filled 2018-07-26: qty 100

## 2018-07-26 SURGICAL SUPPLY — 30 items
BLADE CLIPPER SURG (BLADE) ×3 IMPLANT
BLADE SURG 15 STRL LF DISP TIS (BLADE) ×2 IMPLANT
BLADE SURG 15 STRL SS (BLADE) ×4
CHLORAPREP W/TINT 26ML (MISCELLANEOUS) ×3 IMPLANT
COVER WAND RF STERILE (DRAPES) ×3 IMPLANT
DECANTER SPIKE VIAL GLASS SM (MISCELLANEOUS) ×4 IMPLANT
DERMABOND ADVANCED (GAUZE/BANDAGES/DRESSINGS) ×2
DERMABOND ADVANCED .7 DNX12 (GAUZE/BANDAGES/DRESSINGS) ×1 IMPLANT
DRAIN PENROSE 1/4X12 LTX (DRAIN) IMPLANT
DRAPE LAPAROTOMY 77X122 PED (DRAPES) ×3 IMPLANT
ELECT CAUTERY BLADE 6.4 (BLADE) ×3 IMPLANT
ELECT REM PT RETURN 9FT ADLT (ELECTROSURGICAL) ×3
ELECTRODE REM PT RTRN 9FT ADLT (ELECTROSURGICAL) ×1 IMPLANT
GLOVE BIO SURGEON STRL SZ7 (GLOVE) ×3 IMPLANT
GLOVE BIOGEL PI IND STRL 7.5 (GLOVE) ×1 IMPLANT
GLOVE BIOGEL PI INDICATOR 7.5 (GLOVE) ×2
GOWN STRL REUS W/ TWL LRG LVL3 (GOWN DISPOSABLE) ×2 IMPLANT
GOWN STRL REUS W/TWL LRG LVL3 (GOWN DISPOSABLE) ×4
KIT TURNOVER KIT A (KITS) ×3 IMPLANT
MESH VENTRALEX ST 2.5 CRC MED (Mesh General) ×2 IMPLANT
NEEDLE HYPO 22GX1.5 SAFETY (NEEDLE) ×3 IMPLANT
NS IRRIG 1000ML POUR BTL (IV SOLUTION) ×3 IMPLANT
PACK BASIN MINOR ARMC (MISCELLANEOUS) ×3 IMPLANT
SUT ETHIBOND 0 MO6 C/R (SUTURE) ×3 IMPLANT
SUT MNCRL AB 4-0 PS2 18 (SUTURE) ×3 IMPLANT
SUT VIC AB 2-0 SH 27 (SUTURE) ×2
SUT VIC AB 2-0 SH 27XBRD (SUTURE) ×1 IMPLANT
SUT VIC AB 3-0 SH 27 (SUTURE) ×2
SUT VIC AB 3-0 SH 27X BRD (SUTURE) ×1 IMPLANT
SYR 10ML LL (SYRINGE) ×3 IMPLANT

## 2018-07-26 NOTE — Anesthesia Preprocedure Evaluation (Signed)
Anesthesia Evaluation  Patient identified by MRN, date of birth, ID band Patient awake    Reviewed: Allergy & Precautions, NPO status , Patient's Chart, lab work & pertinent test results  History of Anesthesia Complications Negative for: history of anesthetic complications  Airway Mallampati: II  TM Distance: >3 FB Neck ROM: Full    Dental no notable dental hx.    Pulmonary neg sleep apnea, neg COPD, Current Smoker,    breath sounds clear to auscultation- rhonchi (-) wheezing      Cardiovascular Exercise Tolerance: Good hypertension, Pt. on medications (-) CAD, (-) Past MI, (-) Cardiac Stents and (-) CABG  Rhythm:Regular Rate:Normal - Systolic murmurs and - Diastolic murmurs    Neuro/Psych neg Seizures negative neurological ROS  negative psych ROS   GI/Hepatic Neg liver ROS, GERD  ,  Endo/Other  negative endocrine ROSneg diabetes  Renal/GU negative Renal ROS     Musculoskeletal  (+) Arthritis ,   Abdominal (+) + obese,   Peds  Hematology negative hematology ROS (+)   Anesthesia Other Findings Past Medical History: No date: GERD (gastroesophageal reflux disease) No date: Gout No date: Hypertension   Reproductive/Obstetrics                             Anesthesia Physical Anesthesia Plan  ASA: II  Anesthesia Plan: General   Post-op Pain Management:    Induction: Intravenous  PONV Risk Score and Plan: 0 and Ondansetron  Airway Management Planned: Oral ETT  Additional Equipment:   Intra-op Plan:   Post-operative Plan: Extubation in OR  Informed Consent: I have reviewed the patients History and Physical, chart, labs and discussed the procedure including the risks, benefits and alternatives for the proposed anesthesia with the patient or authorized representative who has indicated his/her understanding and acceptance.     Dental advisory given  Plan Discussed with: CRNA  and Anesthesiologist  Anesthesia Plan Comments:         Anesthesia Quick Evaluation

## 2018-07-26 NOTE — Interval H&P Note (Signed)
History and Physical Interval Note:  07/26/2018 9:52 AM  [name]  has presented today for surgery, with the diagnosis of UMBILICAL HERNIA  The various methods of treatment have been discussed with the patient and family. After consideration of risks, benefits and other options for treatment, the patient has consented to  Procedure(s): OPEN HERNIA REPAIR UMBILICAL ADULT WITH MESH (N/A) as a surgical intervention .  The patient's history has been reviewed, patient examined, no change in status, stable for surgery.  I have reviewed the patient's chart and labs.  Questions were answered to the patient's satisfaction.     Vickie Epley

## 2018-07-26 NOTE — Discharge Instructions (Addendum)
In addition to included general post-operative instructions for Open Repair of Umbilical Hernia with mesh,  Diet: Resume home heart healthy diet.   Activity: No heavy lifting >15 - 20 pounds (children, pets, laundry, garbage) or strenuous activity until follow-up in 2 weeks, but light activity and walking are encouraged. Do not drive or drink alcohol if taking narcotic pain medications.  Wound care: Apply elastic abdominal binder in morning (after shower) and remove before bed. 2 days after surgery (Sunday afternoon, 2/9), you may shower/get incision wet with soapy water and pat dry (do not rub incisions), but no baths or submerging incision underwater until follow-up.   Medications: Resume all home medications. For mild to moderate pain: acetaminophen (Tylenol) or ibuprofen/naproxen (if no kidney disease). Combining Tylenol with alcohol can substantially increase your risk of causing liver disease. Narcotic pain medications, if prescribed, can be used for severe pain, though may cause nausea, constipation, and drowsiness. Do not combine Tylenol and Percocet (or similar) within a 6 hour period as Percocet (and similar) contain(s) Tylenol. If you do not need the narcotic pain medication, you do not need to fill the prescription.  Call office [phone]) at any time if any questions, worsening pain, fevers/chills, bleeding, drainage from incision site, or other concerns.   AMBULATORY SURGERY  DISCHARGE INSTRUCTIONS   1) The drugs that you were given will stay in your system until tomorrow so for the next 24 hours you should not:  A) Drive an automobile B) Make any legal decisions C) Drink any alcoholic beverage   2) You may resume regular meals tomorrow.  Today it is better to start with liquids and gradually work up to solid foods.  You may eat anything you prefer, but it is better to start with liquids, then soup and crackers, and gradually work up to solid foods.   3) Please notify  your doctor immediately if you have any unusual bleeding, trouble breathing, redness and pain at the surgery site, drainage, fever, or pain not relieved by medication.    4) Additional Instructions: TAKE A STOOL SOFTENER TWICE A DAY WHILE TAKING NARCOTIC PAIN MEDICINE TO PREVENT CONSTIPATION   Please contact your physician with any problems or Same Day Surgery at [phone], Monday through Friday 6 am to 4 pm, or [place] at [facility] number at [phone].

## 2018-07-26 NOTE — Anesthesia Postprocedure Evaluation (Signed)
Anesthesia Post Note  Patient: EHTAN DELFAVERO  Procedure(s) Performed: OPEN HERNIA REPAIR UMBILICAL ADULT WITH MESH (N/A )  Patient location during evaluation: PACU Anesthesia Type: General Level of consciousness: awake and alert and oriented Pain management: pain level controlled Vital Signs Assessment: post-procedure vital signs reviewed and stable Respiratory status: spontaneous breathing, nonlabored ventilation and respiratory function stable Cardiovascular status: blood pressure returned to baseline and stable Postop Assessment: no signs of nausea or vomiting Anesthetic complications: no     Last Vitals:  Vitals:   07/26/18 1245 07/26/18 1317  BP:  (!) 140/93  Pulse:  94  Resp:  14  Temp: 37.2 C 37.4 C  SpO2:  92%    Last Pain:  Vitals:   07/26/18 1317  TempSrc: Temporal  PainSc: 5                  Jolie Strohecker

## 2018-07-26 NOTE — Op Note (Signed)
SURGICAL OPERATIVE REPORT  DATE OF PROCEDURE: 07/26/2018  ATTENDING Surgeon(s): Vickie Epley, MD  ASSISTANT(S): Anibal Henderson, PA-S  ANESTHESIA: GETA  PRE-OPERATIVE DIAGNOSIS: Increasingly symptomatic (painful) reducible umbilical hernia (KKX-38: K42.9)  POST-OPERATIVE DIAGNOSIS: Increasingly symptomatic (painful) reducible umbilical hernia (HWE-99: K42.9)  PROCEDURE(S):  1.) Open repair of increasingly symptomatic (painful) umbilical hernia with mesh (cpt: 37169)  INTRAOPERATIVE FINDINGS: 2 cm umbilical hernia with adherent omentum and hernia sac  INTRAVENOUS FLUIDS: 1300 mL crystalloid   ESTIMATED BLOOD LOSS: Minimal (<20 mL)   URINE OUTPUT: No Foley catheter  SPECIMENS: None  IMPLANTS: 6.4 cm Bard Ventralex ST ventral hernia repair mesh  DRAINS: None  COMPLICATIONS: None apparent  CONDITION AT END OF PROCEDURE: Hemodynamically stable and extubated  DISPOSITION OF PATIENT: PACU  INDICATIONS FOR PROCEDURE:  Patient is a 43 y.o. male who presented upon referral from his PMD for evaluation of patient's umbilical hernia. He denied abdominal distention, change in bowel function, or N/V and likewise denied constipation or straining with urination, though describes frequent non-productive non-bloody cough with heavy lifting at work and expressed that he'd like to have it repaired. All risks, benefits, and alternatives to above elective procedure were discussed with the patient, all of patient's questions were answered to his expressed satisfaction, and informed consent was obtained and documented accordingly.  DETAILS OF PROCEDURE: Patient was brought to the operating suite and appropriately identified. General anesthesia was administered along with appropriate pre-operative antibiotics, and endotracheal intubation was performed by anesthetist. In supine position, operative site was prepped and draped in the usual sterile fashion, and following a brief time out, local  anesthetic was injected inferior to the umbilicus, and a 2 cm transverse curvilinear incision was made using a #15 blade scalpel and extended deep through subcutaneous tissues using blunt dissection and electrocautery. The hernia sac was then dissected from the undersurface of the umbilical skin and stalk, and the fascial defect was cleared of all omental adhesions. A 6.4 cm Bard Ventralex ST mesh patch was selected, inserted, confirmed to approximate well to fascial edges without intervening viscera or omentum, and patch was secured without tension using Ethibond braided non-absorbable suture, which was also used to re-approximate fascia over secured hernia mesh. The umbilicus was then invaginated using 2-0 Vicryl suture from the dermis to fascia, and the wound was irrigated using sterile saline. Overlying tissues were re-approximated in layers using buried interrupted 3-0 Vicryl suture for dermis and to re-approximate skin, which was cleaned and dried and sterile Dermabond skin glue was applied and allowed to dry.  Patient was then safely able to be extubated, awakened, and transferred to PACU for post-operative monitoring and care.  I was present for all aspects of the above procedure, and no operative complications were apparent.

## 2018-07-26 NOTE — Anesthesia Procedure Notes (Signed)
Procedure Name: Intubation Date/Time: 07/26/2018 10:29 AM Performed by: Lavone Orn, CRNA Pre-anesthesia Checklist: Patient identified, Emergency Drugs available, Suction available, Patient being monitored and Timeout performed Patient Re-evaluated:Patient Re-evaluated prior to induction Oxygen Delivery Method: Circle system utilized Preoxygenation: Pre-oxygenation with 100% oxygen Induction Type: IV induction Ventilation: Mask ventilation without difficulty Laryngoscope Size: Mac and 4 Grade View: Grade II Tube type: Oral Tube size: 7.5 mm Number of attempts: 1 Airway Equipment and Method: Stylet Placement Confirmation: ETT inserted through vocal cords under direct vision and positive ETCO2 Secured at: 23 cm Tube secured with: Tape Dental Injury: Teeth and Oropharynx as per pre-operative assessment

## 2018-07-26 NOTE — Anesthesia Post-op Follow-up Note (Signed)
Anesthesia QCDR form completed.        

## 2018-07-26 NOTE — Transfer of Care (Signed)
Immediate Anesthesia Transfer of Care Note  Patient: Albert Hill  Procedure(s) Performed: OPEN HERNIA REPAIR UMBILICAL ADULT WITH MESH (N/A )  Patient Location: PACU  Anesthesia Type:General  Level of Consciousness: drowsy  Airway & Oxygen Therapy: Patient Spontanous Breathing and Patient connected to face mask oxygen  Post-op Assessment: Report given to RN and Post -op Vital signs reviewed and stable  Post vital signs: stable  Last Vitals:  Vitals Value Taken Time  BP 130/85 07/26/2018 12:11 PM  Temp 37.3 C 07/26/2018 12:11 PM  Pulse 98 07/26/2018 12:19 PM  Resp 13 07/26/2018 12:19 PM  SpO2 98 % 07/26/2018 12:19 PM  Vitals shown include unvalidated device data.  Last Pain:  Vitals:   07/26/18 1211  TempSrc:   PainSc: 0-No pain         Complications: No apparent anesthesia complications

## 2018-07-31 DIAGNOSIS — K429 Umbilical hernia without obstruction or gangrene: Secondary | ICD-10-CM

## 2018-08-08 ENCOUNTER — Ambulatory Visit (INDEPENDENT_AMBULATORY_CARE_PROVIDER_SITE_OTHER): Payer: Self-pay | Admitting: Surgery

## 2018-08-08 ENCOUNTER — Other Ambulatory Visit: Payer: Self-pay

## 2018-08-08 ENCOUNTER — Encounter: Payer: Self-pay | Admitting: Surgery

## 2018-08-08 VITALS — BP 126/84 | HR 83 | Temp 98.8°F | Resp 16 | Ht 70.0 in | Wt 233.2 lb

## 2018-08-08 DIAGNOSIS — K429 Umbilical hernia without obstruction or gangrene: Secondary | ICD-10-CM

## 2018-08-08 DIAGNOSIS — Z4889 Encounter for other specified surgical aftercare: Secondary | ICD-10-CM

## 2018-08-08 NOTE — Progress Notes (Signed)
Surgical Clinic Progress/Follow-up Note   HPI:  43 y.o. Male presents to clinic for post-op follow-up 13 Days s/p open repair of patient's increasingly symptomatic (painful) reducible umbilical hernia with mesh Rosana Hoes, 07/26/2018). Patient reports complete resolution of pre-operative pain and has been tolerating regular diet with +flatus and normal BM's, denies N/V, fever/chills, CP, or SOB. 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  Review of Systems:  Constitutional: denies fever/chills  Respiratory: denies shortness of breath, wheezing  Cardiovascular: denies chest pain, palpitations  Gastrointestinal: abdominal pain, N/V, and bowel function as per interval history Skin: Denies any other rashes or skin discolorations except post-surgical wounds as per interval history  Vital Signs:  BP 126/84   Pulse 83   Temp 98.8 F (37.1 C) (Temporal)   Resp 16   Ht 5\' 10"  (1.778 m)   Wt 233 lb 3.2 oz (105.8 kg)   SpO2 96%   BMI 33.46 kg/m    Physical Exam:  Constitutional:  -- Overweight non-obese body habitus  -- Awake, alert, and oriented x3  Pulmonary:  -- No crackles -- Equal breath sounds bilaterally -- Breathing non-labored at rest Cardiovascular:  -- S1, S2 present  -- No pericardial rubs  Gastrointestinal:  -- Soft and non-distended, non-tender to palpation, no guarding/rebound tenderness -- Post-surgical incision well-approximated without any peri-incisional erythema or drainage -- No abdominal masses appreciated, pulsatile or otherwise  Musculoskeletal / Integumentary:  -- Wounds or skin discoloration: None appreciated except post-surgical incision as described above (GI) -- Extremities: B/L UE and LE FROM, hands and feet warm, no edema   Imaging: No new  pertinent imaging available for review  Assessment:  43 y.o. yo Male with a problem list including...  Patient Active Problem List   Diagnosis Date Noted  . Umbilical hernia without obstruction and without gangrene   . Gout of ankle 04/11/2018  . Penile lesion 04/11/2018  . Essential hypertension 01/31/2018    presents to clinic for post-op follow-up evaluation, doing well 13 Days s/p open repair of patient's increasingly symptomatic (painful) reducible umbilical hernia with mesh Rosana Hoes, 07/26/2018).  Plan:              - advance diet as tolerated              - okay to submerge incisions under water (baths, swimming) prn             - no heavy lifting >40 lbs x 4 more weeks, after which may gradually resume all activities without restrictions             - apply sunblock particularly to incisions with sun exposure to reduce pigmentation of scars             - return to clinic as needed, instructed to call office if any questions or concerns  All of the above recommendations were discussed with the patient, and all of patient's questions were answered to his expressed satisfaction.  -- Marilynne Drivers Rosana Hoes, MD, [place]: [facility] - Partnering for exceptional care. Office: [phone]

## 2018-08-08 NOTE — Patient Instructions (Addendum)
You may resume normal activities after 09/05/2018.     GENERAL POST-OPERATIVE PATIENT INSTRUCTIONS   WOUND CARE INSTRUCTIONS:  Keep a dry clean dressing on the wound if there is drainage. The initial bandage may be removed after 24 hours.  Once the wound has quit draining you may leave it open to air.  If clothing rubs against the wound or causes irritation and the wound is not draining you may cover it with a dry dressing during the daytime.  Try to keep the wound dry and avoid ointments on the wound unless directed to do so.  If the wound becomes bright red and painful or starts to drain infected material that is not clear, please contact your physician immediately.  If the wound is mildly pink and has a thick firm ridge underneath it, this is normal, and is referred to as a healing ridge.  This will resolve over the next 4-6 weeks.  BATHING: You may shower if you have been informed of this by your surgeon. However, Please do not submerge in a tub, hot tub, or pool until incisions are completely sealed or have been told by your surgeon that you may do so.  DIET:  You may eat any foods that you can tolerate.  It is a good idea to eat a high fiber diet and take in plenty of fluids to prevent constipation.  If you do become constipated you may want to take a mild laxative or take ducolax tablets on a daily basis until your bowel habits are regular.  Constipation can be very uncomfortable, along with straining, after recent surgery.  ACTIVITY:  You are encouraged to cough and deep breath or use your incentive spirometer if you were given one, every 15-30 minutes when awake.  This will help prevent respiratory complications and low grade fevers post-operatively if you had a general anesthetic.  You may want to hug a pillow when coughing and sneezing to add additional support to the surgical area, if you had abdominal or chest surgery, which will decrease pain during these times.  You are encouraged to walk  and engage in light activity for the next two weeks.  You should not lift more than 40 pounds, until  09/05/2018 as it could put you at increased risk for complications.  Twenty pounds is roughly equivalent to a plastic bag of groceries. At that time- Listen to your body when lifting, if you have pain when lifting, stop and then try again in a few days. Soreness after doing exercises or activities of daily living is normal as you get back in to your normal routine.  MEDICATIONS:  Try to take narcotic medications and anti-inflammatory medications, such as tylenol, ibuprofen, naprosyn, etc., with food.  This will minimize stomach upset from the medication.  Should you develop nausea and vomiting from the pain medication, or develop a rash, please discontinue the medication and contact your physician.  You should not drive, make important decisions, or operate machinery when taking narcotic pain medication.  SUNBLOCK Use sun block to incision area over the next year if this area will be exposed to sun. This helps decrease scarring and will allow you avoid a permanent darkened area over your incision.  QUESTIONS:  Please feel free to call our office if you have any questions, and we will be glad to assist you. [phone]

## 2018-08-22 ENCOUNTER — Ambulatory Visit: Payer: Self-pay | Admitting: Adult Health Nurse Practitioner

## 2018-08-22 ENCOUNTER — Other Ambulatory Visit: Payer: Self-pay

## 2018-08-22 DIAGNOSIS — J45909 Unspecified asthma, uncomplicated: Secondary | ICD-10-CM

## 2018-08-22 DIAGNOSIS — R1033 Periumbilical pain: Secondary | ICD-10-CM

## 2018-08-22 MED ORDER — OMEPRAZOLE 20 MG PO CPDR: 20.0000 mg | DELAYED_RELEASE_CAPSULE | Freq: Every day | ORAL | 5 refills | Status: DC

## 2018-08-22 MED ORDER — ALBUTEROL SULFATE HFA 108 (90 BASE) MCG/ACT IN AERS: 2.0000 | INHALATION_SPRAY | Freq: Four times a day (QID) | RESPIRATORY_TRACT | 5 refills | Status: DC | PRN

## 2018-08-22 NOTE — Progress Notes (Signed)
  Patient: Albert Hill Male    DOB: 10-Sep-1975   44 y.o.   MRN: [id number] Visit Date: 08/22/2018  Today's Provider: Staci Acosta, NP   Chief Complaint  Patient presents with  . Follow-up    medications   Subjective:    HPI    Surgery went well. FU with surgeon on 2.20.20.  Taking medications as directed.    No Known Allergies Previous Medications   ACETAMINOPHEN (TYLENOL) 500 MG TABLET    Take 1,000 mg by mouth daily as needed for moderate pain.   ALBUTEROL (PROVENTIL HFA;VENTOLIN HFA) 108 (90 BASE) MCG/ACT INHALER    Inhale 2 puffs into the lungs every 6 (six) hours as needed for wheezing or shortness of breath.   ALLOPURINOL (ZYLOPRIM) 100 MG TABLET    Take 1 tablet (100 mg total) by mouth daily.   AMLODIPINE (NORVASC) 5 MG TABLET    TAKE ONE TABLET BY MOUTH EVERY DAY   LISINOPRIL (PRINIVIL,ZESTRIL) 10 MG TABLET    Take 1 tablet (10 mg total) by mouth daily.   MULTIPLE VITAMIN (MULTIVITAMIN WITH MINERALS) TABS TABLET    Take 1 tablet by mouth daily.   OMEPRAZOLE (PRILOSEC) 20 MG CAPSULE    Take 1 capsule (20 mg total) by mouth daily.    Review of Systems  All other systems reviewed and are negative.   Social History   Tobacco Use  . Smoking status: Current Every Day Smoker    Packs/day: 1.00    Years: 20.00    Pack years: 20.00    Types: Cigarettes  . Smokeless tobacco: Current User    Types: Chew  Substance Use Topics  . Alcohol use: Not Currently    Comment: occasional   Objective:   BP 130/87 (BP Location: Left Arm, Patient Position: Sitting)   Pulse (!) 101   Temp 97.9 F (36.6 C)   Ht 5' 9.5" (1.765 m)   Wt 235 lb (106.6 kg)   BMI 34.21 kg/m   Physical Exam Vitals signs reviewed.  Constitutional:      Appearance: Normal appearance.  HENT:     Head: Normocephalic and atraumatic.  Cardiovascular:     Rate and Rhythm: Normal rate and regular rhythm.  Pulmonary:     Effort: Pulmonary effort is normal.     Breath sounds: Normal breath  sounds.  Abdominal:     General: Bowel sounds are normal.  Skin:    General: Skin is warm and dry.  Neurological:     Mental Status: He is alert.         Assessment & Plan:        Continue surgery aftercare per surgeon.   Continue current medications.  Will get after surgery labs.     Staci Acosta, NP   [facility]

## 2018-08-26 ENCOUNTER — Telehealth: Payer: Self-pay | Admitting: Pharmacist

## 2018-08-26 NOTE — Telephone Encounter (Signed)
08/26/2018 1:33:03 PM - Ventolin refill online  08/26/2018 Placed refill online with [facility] for Ventolin , to ship 10/17/2018, order# [id number].Delos Haring

## 2018-08-29 ENCOUNTER — Other Ambulatory Visit: Payer: Self-pay

## 2018-08-29 DIAGNOSIS — R1033 Periumbilical pain: Secondary | ICD-10-CM

## 2018-08-30 LAB — COMPREHENSIVE METABOLIC PANEL
ALT: 29 IU/L (ref 0–44)
AST: 22 IU/L (ref 0–40)
Albumin/Globulin Ratio: 2 (ref 1.2–2.2)
Albumin: 4.5 g/dL (ref 4.0–5.0)
Alkaline Phosphatase: 82 IU/L (ref 39–117)
BUN/Creatinine Ratio: 17 (ref 9–20)
BUN: 18 mg/dL (ref 6–24)
CO2: 19 mmol/L — ABNORMAL LOW (ref 20–29)
Calcium: 9 mg/dL (ref 8.7–10.2)
Chloride: 101 mmol/L (ref 96–106)
Creatinine, Ser: 1.06 mg/dL (ref 0.76–1.27)
GFR calc Af Amer: 98 mL/min/{1.73_m2} (ref >59)
GFR calc non Af Amer: 85 mL/min/{1.73_m2} (ref >59)
GLUCOSE: 125 mg/dL — AB (ref 65–99)
Globulin, Total: 2.3 g/dL (ref 1.5–4.5)
Potassium: 4.1 mmol/L (ref 3.5–5.2)
Sodium: 138 mmol/L (ref 134–144)
Total Protein: 6.8 g/dL (ref 6.0–8.5)

## 2018-08-30 LAB — CBC
Hematocrit: 37.2 % — ABNORMAL LOW (ref 37.5–51.0)
Hemoglobin: 13.2 g/dL (ref 13.0–17.7)
MCH: 31.1 pg (ref 26.6–33.0)
MCHC: 35.5 g/dL (ref 31.5–35.7)
MCV: 88 fL (ref 79–97)
Platelets: 297 10*3/uL (ref 150–450)
RBC: 4.25 x10E6/uL (ref 4.14–5.80)
RDW: 13 % (ref 11.6–15.4)
WBC: 9.5 10*3/uL (ref 3.4–10.8)

## 2018-09-05 ENCOUNTER — Emergency Department: Payer: Self-pay

## 2018-09-05 ENCOUNTER — Other Ambulatory Visit: Payer: Self-pay

## 2018-09-05 ENCOUNTER — Emergency Department
Admission: EM | Admit: 2018-09-05 | Discharge: 2018-09-05 | Disposition: A | Discharge status: Home / Self Care | Payer: Self-pay | Attending: Emergency Medicine | Admitting: Emergency Medicine

## 2018-09-05 ENCOUNTER — Encounter: Payer: Self-pay | Admitting: Emergency Medicine

## 2018-09-05 DIAGNOSIS — J181 Lobar pneumonia, unspecified organism: Secondary | ICD-10-CM

## 2018-09-05 DIAGNOSIS — J189 Pneumonia, unspecified organism: Secondary | ICD-10-CM | POA: Insufficient documentation

## 2018-09-05 DIAGNOSIS — I10 Essential (primary) hypertension: Secondary | ICD-10-CM | POA: Insufficient documentation

## 2018-09-05 DIAGNOSIS — Z79899 Other long term (current) drug therapy: Secondary | ICD-10-CM | POA: Insufficient documentation

## 2018-09-05 DIAGNOSIS — F17210 Nicotine dependence, cigarettes, uncomplicated: Secondary | ICD-10-CM | POA: Insufficient documentation

## 2018-09-05 MED ORDER — FLUTICASONE PROPIONATE 50 MCG/ACT NA SUSP: 1.0000 | Freq: Two times a day (BID) | NASAL | 0 refills | Status: DC

## 2018-09-05 MED ORDER — AZITHROMYCIN 250 MG PO TABS: ORAL_TABLET | ORAL | 0 refills | Status: DC

## 2018-09-05 MED ORDER — CEFTRIAXONE SODIUM 1 G IJ SOLR
1.0000 g | Freq: Once | INTRAMUSCULAR | Status: AC
  Administered 2018-09-05: 1 g via INTRAMUSCULAR
  Filled 2018-09-05: qty 10

## 2018-09-05 MED ORDER — PSEUDOEPH-BROMPHEN-DM 30-2-10 MG/5ML PO SYRP: 10.0000 mL | ORAL_SOLUTION | Freq: Four times a day (QID) | ORAL | 0 refills | Status: DC | PRN

## 2018-09-05 NOTE — ED Provider Notes (Signed)
[facility] Emergency Department Provider Note  ____________________________________________  Time seen: Approximately 6:38 PM  I have reviewed the triage vital signs and the nursing notes.   HISTORY  Chief Complaint No chief complaint on file.    HPI Albert Hill is a 43 y.o. male who presents the emergency department complaining of fever, nasal congestion, sore throat, cough.  Patient reports that he has had 5 days of symptoms.  Patient initially thought he just had a mild viral infection but states that the cough is worsened, and he feels "ragged."  No frank shortness of breath.  No recent travel or known exposure to anybody with coronavirus.  Patient has been using Tylenol, Mucinex with no significant relief.  Patient denies any history of lung or heart conditions.  No other complaints at this time.         Past Medical History:  Diagnosis Date  . GERD (gastroesophageal reflux disease)   . Gout   . Hypertension   . Umbilical hernia without obstruction and without gangrene     Patient Active Problem List   Diagnosis Date Noted  . Gout of ankle 04/11/2018  . Penile lesion 04/11/2018  . Essential hypertension 01/31/2018    Past Surgical History:  Procedure Laterality Date  . CYST EXCISION     Patient states he was 43 years old  . UMBILICAL HERNIA REPAIR N/A 07/26/2018   Procedure: OPEN HERNIA REPAIR UMBILICAL ADULT WITH MESH;  Surgeon: Vickie Epley, MD;  Location: [facility] ORS;  Service: General;  Laterality: N/A;    Prior to Admission medications   Medication Sig Start Date End Date Taking? Authorizing Provider  acetaminophen (TYLENOL) 500 MG tablet Take 1,000 mg by mouth daily as needed for moderate pain.    [provider]  albuterol (PROVENTIL HFA;VENTOLIN HFA) 108 (90 Base) MCG/ACT inhaler Inhale 2 puffs into the lungs every 6 (six) hours as needed for wheezing or shortness of breath. 08/22/18   Doles-Johnson, Teah, NP   allopurinol (ZYLOPRIM) 100 MG tablet Take 1 tablet (100 mg total) by mouth daily. Patient taking differently: Take 100 mg by mouth every morning.  07/11/18   Jerrol Banana., MD  amLODipine (NORVASC) 5 MG tablet TAKE ONE TABLET BY MOUTH EVERY DAY Patient taking differently: Take 5 mg by mouth every morning.  07/12/18   Doles-Johnson, Teah, NP  azithromycin (ZITHROMAX Z-PAK) 250 MG tablet Take 2 tablets (500 mg) on  Day 1,  followed by 1 tablet (250 mg) once daily on Days 2 through 5. 09/05/18   Usman Millett, Roderic Palau D, PA-C  brompheniramine-pseudoephedrine-DM 30-2-10 MG/5ML syrup Take 10 mLs by mouth 4 (four) times daily as needed. 09/05/18   Blandon Offerdahl, Charline Bills, PA-C  fluticasone (FLONASE) 50 MCG/ACT nasal spray Place 1 spray into both nostrils 2 (two) times daily. 09/05/18   Elih Mooney, Charline Bills, PA-C  lisinopril (PRINIVIL,ZESTRIL) 10 MG tablet Take 1 tablet (10 mg total) by mouth daily. Patient taking differently: Take 10 mg by mouth every morning.  04/11/18   Park Liter P, DO  Multiple Vitamin (MULTIVITAMIN WITH MINERALS) TABS tablet Take 1 tablet by mouth daily.    [provider]  omeprazole (PRILOSEC) 20 MG capsule Take 1 capsule (20 mg total) by mouth daily. 08/22/18   Doles-Johnson, Teah, NP    Allergies Patient has no known allergies.  Family History  Problem Relation Age of Onset  . Lung cancer Father     Social History Social History   Tobacco Use  .  Smoking status: Current Every Day Smoker    Packs/day: 1.00    Years: 20.00    Pack years: 20.00    Types: Cigarettes  . Smokeless tobacco: Current User    Types: Chew  Substance Use Topics  . Alcohol use: Not Currently    Comment: occasional  . Drug use: No     Review of Systems  Constitutional: Positive fever/chills Eyes: No visual changes. No discharge ENT: Positive for nasal congestion, sore throat Cardiovascular: no chest pain. Respiratory: Positive cough. No SOB. Gastrointestinal: No  abdominal pain.  No nausea, no vomiting.  No diarrhea.  No constipation. Musculoskeletal: Negative for musculoskeletal pain. Skin: Negative for rash, abrasions, lacerations, ecchymosis. Neurological: Negative for headaches, focal weakness or numbness. 10-point ROS otherwise negative.  ____________________________________________   PHYSICAL EXAM:  VITAL SIGNS: ED Triage Vitals [09/05/18 1636]  Enc Vitals Group     BP (!) 139/91     Pulse Rate 92     Resp 16     Temp 98.5 F (36.9 C)     Temp Source Oral     SpO2 97 %     Weight      Height      Head Circumference      Peak Flow      Pain Score 0     Pain Loc      Pain Edu?      Excl. in [place]?      Constitutional: Alert and oriented. Well appearing and in no acute distress. Eyes: Conjunctivae are normal. PERRL. EOMI. Head: Atraumatic. ENT:      Ears: EACs and TMs unremarkable bilaterally.      Nose: Moderate congestion/rhinnorhea.      Mouth/Throat: Mucous membranes are moist.  Oropharynx is nonerythematous and nonedematous.  Uvula is midline. Neck: No stridor.  Neck is supple full range of motion Hematological/Lymphatic/Immunilogical: No cervical lymphadenopathy. Cardiovascular: Normal rate, regular rhythm. Normal S1 and S2.  Good peripheral circulation. Respiratory: Normal respiratory effort without tachypnea or retractions. Lungs with end expiratory wheeze and mild crackle to the left lower lung field.  No rales or rhonchi.Kermit Balo air entry to the bases with no decreased or absent breath sounds. Musculoskeletal: Full range of motion to all extremities. No gross deformities appreciated. Neurologic:  Normal speech and language. No gross focal neurologic deficits are appreciated.  Skin:  Skin is warm, dry and intact. No rash noted. Psychiatric: Mood and affect are normal. Speech and behavior are normal. Patient exhibits appropriate insight and judgement.   ____________________________________________   LABS (all labs  ordered are listed, but only abnormal results are displayed)  Labs Reviewed - No data to display ____________________________________________  EKG   ____________________________________________  RADIOLOGY I personally viewed and evaluated these images as part of my medical decision making, as well as reviewing the written report by the radiologist.  I concur with radiologist finding of mild infiltrate in the left lateral lung base.  Dg Chest 2 View  Result Date: 09/05/2018 CLINICAL DATA:  Cough and congestion EXAM: CHEST - 2 VIEW COMPARISON:  Nov 15, 2017 FINDINGS: There is a small area of infiltrate in the lateral left base. Lungs elsewhere are clear. Heart size and pulmonary vascularity are normal. No adenopathy. No bone lesions. IMPRESSION: Small area of infiltrate felt to represent pneumonia lateral left base. Lungs elsewhere clear. No adenopathy. Electronically Signed   By: Lowella Grip III M.D.   On: 09/05/2018 19:18    ____________________________________________    PROCEDURES  Procedure(s) performed:    Procedures    Medications  cefTRIAXone (ROCEPHIN) injection 1 g (has no administration in time range)     ____________________________________________   INITIAL IMPRESSION / ASSESSMENT AND PLAN / ED COURSE  Pertinent labs & imaging results that were available during my care of the patient were reviewed by me and considered in my medical decision making (see chart for details).  Review of the [place] CSRS was performed in accordance of the [place] prior to dispensing any controlled drugs.           Patient's diagnosis is consistent with left lower lobe pneumonia.  Patient presented to the emergency department with multiple URI symptoms.  Differential included viral URI, sinusitis, strep, pneumonia, bronchitis, coronavirus.  Patient has no high risk travel or contacts.. Patient is given injection of Rocephin in the emergency department.  Patient will be discharged  home with prescriptions for Z-Pak, Flonase, Bromfed cough syrup. Patient is to follow up with primary care as needed or otherwise directed. Patient is given ED precautions to return to the ED for any worsening or new symptoms.     ____________________________________________  FINAL CLINICAL IMPRESSION(S) / ED DIAGNOSES  Final diagnoses:  Community acquired pneumonia of left lower lobe of lung ([place])      NEW MEDICATIONS STARTED DURING THIS VISIT:  ED Discharge Orders         Ordered    azithromycin (ZITHROMAX Z-PAK) 250 MG tablet     09/05/18 1948    brompheniramine-pseudoephedrine-DM 30-2-10 MG/5ML syrup  4 times daily PRN     09/05/18 1948    fluticasone (FLONASE) 50 MCG/ACT nasal spray  2 times daily     09/05/18 1948              This chart was dictated using voice recognition software/Dragon. Despite best efforts to proofread, errors can occur which can change the meaning. Any change was purely unintentional.    Darletta Moll, PA-C 09/05/18 1949    Harvest Dark, MD 09/05/18 2337

## 2018-09-05 NOTE — ED Triage Notes (Signed)
Pt c/o sore throat that began last week and has recently started having cough, congestion. PT denies any SOB just congestion.

## 2018-10-07 ENCOUNTER — Other Ambulatory Visit: Payer: Self-pay | Admitting: Internal Medicine

## 2018-10-07 DIAGNOSIS — I10 Essential (primary) hypertension: Secondary | ICD-10-CM

## 2018-10-10 ENCOUNTER — Other Ambulatory Visit: Payer: Self-pay | Admitting: Gerontology

## 2018-10-10 DIAGNOSIS — I10 Essential (primary) hypertension: Secondary | ICD-10-CM

## 2018-12-11 ENCOUNTER — Other Ambulatory Visit: Payer: Self-pay | Admitting: Internal Medicine

## 2018-12-11 DIAGNOSIS — I10 Essential (primary) hypertension: Secondary | ICD-10-CM

## 2018-12-12 ENCOUNTER — Other Ambulatory Visit: Payer: Self-pay

## 2018-12-12 DIAGNOSIS — I10 Essential (primary) hypertension: Secondary | ICD-10-CM

## 2018-12-12 MED ORDER — AMLODIPINE BESYLATE 5 MG PO TABS: 5.0000 mg | ORAL_TABLET | Freq: Every day | ORAL | 0 refills | Status: DC

## 2018-12-31 ENCOUNTER — Other Ambulatory Visit: Payer: Self-pay

## 2018-12-31 ENCOUNTER — Ambulatory Visit: Payer: Self-pay | Admitting: Gerontology

## 2018-12-31 ENCOUNTER — Encounter: Payer: Self-pay | Admitting: Gerontology

## 2018-12-31 VITALS — BP 126/91

## 2018-12-31 DIAGNOSIS — R1033 Periumbilical pain: Secondary | ICD-10-CM

## 2018-12-31 DIAGNOSIS — M1000 Idiopathic gout, unspecified site: Secondary | ICD-10-CM

## 2018-12-31 DIAGNOSIS — I10 Essential (primary) hypertension: Secondary | ICD-10-CM

## 2018-12-31 DIAGNOSIS — K219 Gastro-esophageal reflux disease without esophagitis: Secondary | ICD-10-CM

## 2018-12-31 DIAGNOSIS — R899 Unspecified abnormal finding in specimens from other organs, systems and tissues: Secondary | ICD-10-CM

## 2018-12-31 DIAGNOSIS — J45909 Unspecified asthma, uncomplicated: Secondary | ICD-10-CM

## 2018-12-31 MED ORDER — ALLOPURINOL 100 MG PO TABS: 100.0000 mg | ORAL_TABLET | Freq: Every day | ORAL | 2 refills | Status: DC

## 2018-12-31 MED ORDER — LISINOPRIL 10 MG PO TABS: 10.0000 mg | ORAL_TABLET | Freq: Every day | ORAL | 2 refills | Status: DC

## 2018-12-31 MED ORDER — OMEPRAZOLE 20 MG PO CPDR: 20.0000 mg | DELAYED_RELEASE_CAPSULE | Freq: Every day | ORAL | 2 refills | Status: DC

## 2018-12-31 MED ORDER — AMLODIPINE BESYLATE 5 MG PO TABS: 5.0000 mg | ORAL_TABLET | Freq: Every day | ORAL | 2 refills | Status: DC

## 2018-12-31 NOTE — Progress Notes (Signed)
Established Patient Office Visit  Subjective:  Patient ID: Albert Hill, male    DOB: [birth year]  Age: 43 y.o. MRN: [id number]  CC:  Chief Complaint  Patient presents with  . Follow-up    high blood pressure  Patient consents to telephone visit and 2 patient identifiers was used to identify patient.  HPI Albert Hill presents for medication refill and evaluation of Jerrye Bushy, Hypertension and Gout. He reports that he's almost out of his medications. He states that his blood pressure was 126/91 when it was checked a week ago. He denies chest pain, palpitation, light headedness, and peripheral edema. He continues to smoke 1 pack of cigarette daily and admits the desire to quit.He also states that he has not had a gout flare with taking allopurinol 100 mg daily. His acid reflux is controlled with taking 20 mg of Omeprazole. His lipid panel done on 06/06/18 showed triglycerides 344, HDL 35, LDL 74 and total cholesterol 178 mg/dl. He reports that he is making some life style modification and he denies fever, chills and no further concerns.  Past Medical History:  Diagnosis Date  . GERD (gastroesophageal reflux disease)   . Gout   . Hypertension   . Umbilical hernia without obstruction and without gangrene     Past Surgical History:  Procedure Laterality Date  . CYST EXCISION     Patient states he was 43 years old  . UMBILICAL HERNIA REPAIR N/A 07/26/2018   Procedure: OPEN HERNIA REPAIR UMBILICAL ADULT WITH MESH;  Surgeon: Vickie Epley, MD;  Location: [facility] ORS;  Service: General;  Laterality: N/A;    Family History  Problem Relation Age of Onset  . Lung cancer Father     Social History   Socioeconomic History  . Marital status: Divorced    Spouse name: Not on file  . Number of children: 1  . Years of education: Not on file  . Highest education level: Not on file  Occupational History  . Not on file  Social Needs  . Financial resource strain: Not hard at all   . Food insecurity    Worry: Never true    Inability: Never true  . Transportation needs    Medical: No    Non-medical: No  Tobacco Use  . Smoking status: Current Every Day Smoker    Packs/day: 1.00    Years: 20.00    Pack years: 20.00    Types: Cigarettes  . Smokeless tobacco: Current User    Types: Chew  Substance and Sexual Activity  . Alcohol use: Not Currently    Comment: occasional  . Drug use: No  . Sexual activity: Not Currently  Lifestyle  . Physical activity    Days per week: 4 days    Minutes per session: 150+ min  . Stress: Very much  Relationships  . Social connections    Talks on phone: More than three times a week    Gets together: More than three times a week    Attends religious service: 1 to 4 times per year    Active member of club or organization: No    Attends meetings of clubs or organizations: Never    Relationship status: Divorced  . Intimate partner violence    Fear of current or ex partner: No    Emotionally abused: No    Physically abused: No    Forced sexual activity: No  Other Topics Concern  . Not on file  Social History Narrative  .  Not on file    Outpatient Medications Prior to Visit  Medication Sig Dispense Refill  . acetaminophen (TYLENOL) 500 MG tablet Take 1,000 mg by mouth daily as needed for moderate pain.    Marland Kitchen albuterol (PROVENTIL HFA;VENTOLIN HFA) 108 (90 Base) MCG/ACT inhaler Inhale 2 puffs into the lungs every 6 (six) hours as needed for wheezing or shortness of breath. 1 Inhaler 5  . Multiple Vitamin (MULTIVITAMIN WITH MINERALS) TABS tablet Take 1 tablet by mouth daily.    Marland Kitchen allopurinol (ZYLOPRIM) 100 MG tablet Take 1 tablet (100 mg total) by mouth daily. (Patient taking differently: Take 100 mg by mouth every morning. ) 90 tablet 1  . amLODipine (NORVASC) 5 MG tablet Take 1 tablet (5 mg total) by mouth daily. 90 tablet 0  . lisinopril (ZESTRIL) 10 MG tablet TAKE ONE TABLET BY MOUTH EVERY DAY 60 tablet 0  . omeprazole  (PRILOSEC) 20 MG capsule Take 1 capsule (20 mg total) by mouth daily. 30 capsule 5  . brompheniramine-pseudoephedrine-DM 30-2-10 MG/5ML syrup Take 10 mLs by mouth 4 (four) times daily as needed. 200 mL 0  . fluticasone (FLONASE) 50 MCG/ACT nasal spray Place 1 spray into both nostrils 2 (two) times daily. (Patient not taking: Reported on 12/31/2018) 16 g 0  . azithromycin (ZITHROMAX Z-PAK) 250 MG tablet Take 2 tablets (500 mg) on  Day 1,  followed by 1 tablet (250 mg) once daily on Days 2 through 5. 6 each 0   No facility-administered medications prior to visit.     No Known Allergies  ROS Review of Systems  Constitutional: Negative.   HENT: Negative.   Eyes: Negative.   Respiratory: Negative.   Cardiovascular: Negative.   Gastrointestinal: Negative.   Genitourinary: Negative.   Musculoskeletal: Negative.   Skin: Negative.   Neurological: Negative.   Psychiatric/Behavioral: Negative.       Objective:    Physical Exam No vital sign or PE was done BP (!) 126/91 Comment: pt reported without medications Wt Readings from Last 3 Encounters:  08/22/18 235 lb (106.6 kg)  08/08/18 233 lb 3.2 oz (105.8 kg)  07/26/18 226 lb 9.6 oz (102.8 kg)     Health Maintenance Due  Topic Date Due  . HIV Screening  05/23/1989  . TETANUS/TDAP  05/23/1993    There are no preventive care reminders to display for this patient.  Lab Results  Component Value Date   TSH 1.960 06/06/2018   Lab Results  Component Value Date   WBC 9.5 08/29/2018   HGB 13.2 08/29/2018   HCT 37.2 (L) 08/29/2018   MCV 88 08/29/2018   PLT 297 08/29/2018   Lab Results  Component Value Date   NA 138 08/29/2018   K 4.1 08/29/2018   CO2 19 (L) 08/29/2018   GLUCOSE 125 (H) 08/29/2018   BUN 18 08/29/2018   CREATININE 1.06 08/29/2018   BILITOT <0.2 08/29/2018   ALKPHOS 82 08/29/2018   AST 22 08/29/2018   ALT 29 08/29/2018   PROT 6.8 08/29/2018   ALBUMIN 4.5 08/29/2018   CALCIUM 9.0 08/29/2018   Lab Results   Component Value Date   CHOL 178 06/06/2018   Lab Results  Component Value Date   HDL 35 (L) 06/06/2018   Lab Results  Component Value Date   LDLCALC 74 06/06/2018   Lab Results  Component Value Date   TRIG 344 (H) 06/06/2018   Lab Results  Component Value Date   CHOLHDL 5.1 (H) 06/06/2018   Lab Results  Component Value Date   HGBA1C 5.4 06/06/2018      Assessment & Plan:      1. Essential hypertension - He will continue on current treatment regimen. - amLODipine (NORVASC) 5 MG tablet; Take 1 tablet (5 mg total) by mouth daily.  Dispense: 30 tablet; Refill: 2 - lisinopril (ZESTRIL) 10 MG tablet; Take 1 tablet (10 mg total) by mouth daily.  Dispense: 30 tablet; Refill: 2 -Low salt DASH diet -Exercise regularly as tolerated -Check blood pressure at least once a week at home or a nearby pharmacy record and bring to follow up appointment. -Goal is less than 140/90 and normal blood pressure is 120/80   2. Idiopathic gout, unspecified chronicity, unspecified site - He will continue on current treatment regimen. - allopurinol (ZYLOPRIM) 100 MG tablet; Take 1 tablet (100 mg total) by mouth daily.  Dispense: 30 tablet; Refill: 2  3. Gastroesophageal reflux disease without esophagitis - He will continue on current treatment regimen. - omeprazole (PRILOSEC) 20 MG capsule; Take 1 capsule (20 mg total) by mouth daily.  Dispense: 30 capsule; Refill: 2  4. Abnormal laboratory test result The following routine test will be rechecked . - HgB A1c - Lipid panel; Future - Urinalysis; Future   Follow-up: Return in about 3 months (around 04/02/2019), or if symptoms worsen or fail to improve.    Darshana Curnutt Jerold Coombe, NP

## 2018-12-31 NOTE — Patient Instructions (Addendum)
DASH Eating Plan DASH stands for "Dietary Approaches to Stop Hypertension." The DASH eating plan is a healthy eating plan that has been shown to reduce high blood pressure (hypertension). It may also reduce your risk for type 2 diabetes, heart disease, and stroke. The DASH eating plan may also help with weight loss. What are tips for following this plan?  General guidelines  Avoid eating more than 2,300 mg (milligrams) of salt (sodium) a day. If you have hypertension, you may need to reduce your sodium intake to 1,500 mg a day.  Limit alcohol intake to no more than 1 drink a day for nonpregnant women and 2 drinks a day for men. One drink equals 12 oz of beer, 5 oz of wine, or 1 oz of hard liquor.  Work with your health care provider to maintain a healthy body weight or to lose weight. Ask what an ideal weight is for you.  Get at least 30 minutes of exercise that causes your heart to beat faster (aerobic exercise) most days of the week. Activities may include walking, swimming, or biking.  Work with your health care provider or diet and nutrition specialist (dietitian) to adjust your eating plan to your individual calorie needs. Reading food labels   Check food labels for the amount of sodium per serving. Choose foods with less than 5 percent of the Daily Value of sodium. Generally, foods with less than 300 mg of sodium per serving fit into this eating plan.  To find whole grains, look for the word "whole" as the first word in the ingredient list. Shopping  Buy products labeled as "low-sodium" or "no salt added."  Buy fresh foods. Avoid canned foods and premade or frozen meals. Cooking  Avoid adding salt when cooking. Use salt-free seasonings or herbs instead of table salt or sea salt. Check with your health care provider or pharmacist before using salt substitutes.  Do not fry foods. Cook foods using healthy methods such as baking, boiling, grilling, and broiling instead.  Cook with  heart-healthy oils, such as olive, canola, soybean, or sunflower oil. Meal planning  Eat a balanced diet that includes: ? 5 or more servings of fruits and vegetables each day. At each meal, try to fill half of your plate with fruits and vegetables. ? Up to 6-8 servings of whole grains each day. ? Less than 6 oz of lean meat, poultry, or fish each day. A 3-oz serving of meat is about the same size as a deck of cards. One egg equals 1 oz. ? 2 servings of low-fat dairy each day. ? A serving of nuts, seeds, or beans 5 times each week. ? Heart-healthy fats. Healthy fats called Omega-3 fatty acids are found in foods such as flaxseeds and coldwater fish, like sardines, salmon, and mackerel.  Limit how much you eat of the following: ? Canned or prepackaged foods. ? Food that is high in trans fat, such as fried foods. ? Food that is high in saturated fat, such as fatty meat. ? Sweets, desserts, sugary drinks, and other foods with added sugar. ? Full-fat dairy products.  Do not salt foods before eating.  Try to eat at least 2 vegetarian meals each week.  Eat more home-cooked food and less restaurant, buffet, and fast food.  When eating at a restaurant, ask that your food be prepared with less salt or no salt, if possible. What foods are recommended? The items listed may not be a complete list. Talk with your dietitian about   what dietary choices are best for you. Grains Whole-grain or whole-wheat bread. Whole-grain or whole-wheat pasta. Brown rice. Oatmeal. Quinoa. Bulgur. Whole-grain and low-sodium cereals. Pita bread. Low-fat, low-sodium crackers. Whole-wheat flour tortillas. Vegetables Fresh or frozen vegetables (raw, steamed, roasted, or grilled). Low-sodium or reduced-sodium tomato and vegetable juice. Low-sodium or reduced-sodium tomato sauce and tomato paste. Low-sodium or reduced-sodium canned vegetables. Fruits All fresh, dried, or frozen fruit. Canned fruit in natural juice (without  added sugar). Meat and other protein foods Skinless chicken or turkey. Ground chicken or turkey. Pork with fat trimmed off. Fish and seafood. Egg whites. Dried beans, peas, or lentils. Unsalted nuts, nut butters, and seeds. Unsalted canned beans. Lean cuts of beef with fat trimmed off. Low-sodium, lean deli meat. Dairy Low-fat (1%) or fat-free (skim) milk. Fat-free, low-fat, or reduced-fat cheeses. Nonfat, low-sodium ricotta or cottage cheese. Low-fat or nonfat yogurt. Low-fat, low-sodium cheese. Fats and oils Soft margarine without trans fats. Vegetable oil. Low-fat, reduced-fat, or light mayonnaise and salad dressings (reduced-sodium). Canola, safflower, olive, soybean, and sunflower oils. Avocado. Seasoning and other foods Herbs. Spices. Seasoning mixes without salt. Unsalted popcorn and pretzels. Fat-free sweets. What foods are not recommended? The items listed may not be a complete list. Talk with your dietitian about what dietary choices are best for you. Grains Baked goods made with fat, such as croissants, muffins, or some breads. Dry pasta or rice meal packs. Vegetables Creamed or fried vegetables. Vegetables in a cheese sauce. Regular canned vegetables (not low-sodium or reduced-sodium). Regular canned tomato sauce and paste (not low-sodium or reduced-sodium). Regular tomato and vegetable juice (not low-sodium or reduced-sodium). Pickles. Olives. Fruits Canned fruit in a light or heavy syrup. Fried fruit. Fruit in cream or butter sauce. Meat and other protein foods Fatty cuts of meat. Ribs. Fried meat. Bacon. Sausage. Bologna and other processed lunch meats. Salami. Fatback. Hotdogs. Bratwurst. Salted nuts and seeds. Canned beans with added salt. Canned or smoked fish. Whole eggs or egg yolks. Chicken or turkey with skin. Dairy Whole or 2% milk, cream, and half-and-half. Whole or full-fat cream cheese. Whole-fat or sweetened yogurt. Full-fat cheese. Nondairy creamers. Whipped toppings.  Processed cheese and cheese spreads. Fats and oils Butter. Stick margarine. Lard. Shortening. Ghee. Bacon fat. Tropical oils, such as coconut, palm kernel, or palm oil. Seasoning and other foods Salted popcorn and pretzels. Onion salt, garlic salt, seasoned salt, table salt, and sea salt. Worcestershire sauce. Tartar sauce. Barbecue sauce. Teriyaki sauce. Soy sauce, including reduced-sodium. Steak sauce. Canned and packaged gravies. Fish sauce. Oyster sauce. Cocktail sauce. Horseradish that you find on the shelf. Ketchup. Mustard. Meat flavorings and tenderizers. Bouillon cubes. Hot sauce and Tabasco sauce. Premade or packaged marinades. Premade or packaged taco seasonings. Relishes. Regular salad dressings. Where to find more information:  National Heart, Lung, and Blood Institute: www.nhlbi.nih.gov  American Heart Association: www.heart.org Summary  The DASH eating plan is a healthy eating plan that has been shown to reduce high blood pressure (hypertension). It may also reduce your risk for type 2 diabetes, heart disease, and stroke.  With the DASH eating plan, you should limit salt (sodium) intake to 2,300 mg a day. If you have hypertension, you may need to reduce your sodium intake to 1,500 mg a day.  When on the DASH eating plan, aim to eat more fresh fruits and vegetables, whole grains, lean proteins, low-fat dairy, and heart-healthy fats.  Work with your health care provider or diet and nutrition specialist (dietitian) to adjust your eating plan to your   individual calorie needs. This information is not intended to replace advice given to you by your health care provider. Make sure you discuss any questions you have with your health care provider. Document Released: 05/25/2011 Document Revised: 05/18/2017 Document Reviewed: 05/29/2016 Elsevier Patient Education  2020 [place] A low-purine eating plan involves making food choices to limit your intake of  purine. Purine is a kind of uric acid. Too much uric acid in your blood can cause certain conditions, such as gout and kidney stones. Eating a low-purine diet can help control these conditions. What are tips for following this plan? Reading food labels  Avoid foods with saturated or Trans fat. Check the ingredient list of grains-based foods, such as bread and cereal, to make sure that they contain whole grains. Check the ingredient list of sauces or soups to make sure they do not contain meat or fish. When choosing soft drinks, check the ingredient list to make sure they do not contain high-fructose corn syrup. Shopping Buy plenty of fresh fruits and vegetables. Avoid buying canned or fresh fish. Buy dairy products labeled as low-fat or nonfat. Avoid buying premade or processed foods. These foods are often high in fat, salt (sodium), and added sugar. Cooking Use olive oil instead of butter when cooking. Oils like olive oil, canola oil, and sunflower oil contain healthy fats. Meal planning Learn which foods do or do not affect you. If you find out that a food tends to cause your gout symptoms to flare up, avoid eating that food. You can enjoy foods that do not cause problems. If you have any questions about a food item, talk with your dietitian or health care provider. Limit foods high in fat, especially saturated fat. Fat makes it harder for your body to get rid of uric acid. Choose foods that are lower in fat and are lean sources of protein. General guidelines Limit alcohol intake to no more than 1 drink a day for nonpregnant women and 2 drinks a day for men. One drink equals 12 oz of beer, 5 oz of wine, or 1 oz of hard liquor. Alcohol can affect the way your body gets rid of uric acid. Drink plenty of water to keep your urine clear or pale yellow. Fluids can help remove uric acid from your body. If directed by your health care provider, take a vitamin C supplement. Work with your health  care provider and dietitian to develop a plan to achieve or maintain a healthy weight. Losing weight can help reduce uric acid in your blood. What foods are recommended? The items listed may not be a complete list. Talk with your dietitian about what dietary choices are best for you. Foods low in purines Foods low in purines do not need to be limited. These include: All fruits. All low-purine vegetables, pickles, and olives. Breads, pasta, rice, cornbread, and popcorn. Cake and other baked goods. All dairy foods. Eggs, nuts, and nut butters. Spices and condiments, such as salt, herbs, and vinegar. Plant oils, butter, and margarine. Water, sugar-free soft drinks, tea, coffee, and cocoa. Vegetable-based soups, broths, sauces, and gravies. Foods moderate in purines Foods moderate in purines should be limited to the amounts listed.  cup of asparagus, cauliflower, spinach, mushrooms, or green peas, each day. 2/3 cup uncooked oatmeal, each day.  cup dry wheat bran or wheat germ, each day. 2-3 ounces of meat or poultry, each day. 4-6 ounces of shellfish, such as crab, lobster, oysters, or shrimp, each  day. 1 cup cooked beans, peas, or lentils, each day. Soup, broths, or bouillon made from meat or fish. Limit these foods as much as possible. What foods are not recommended? The items listed may not be a complete list. Talk with your dietitian about what dietary choices are best for you. Limit your intake of foods high in purines, including: Beer and other alcohol. Meat-based gravy or sauce. Canned or fresh fish, such as: Anchovies, sardines, herring, and tuna. Mussels and scallops. Codfish, trout, and haddock. Berniece Salines. Organ meats, such as: Liver or kidney. Tripe. Sweetbreads (thymus gland or pancreas). Wild Clinical biochemist. Yeast or yeast extract supplements. Drinks sweetened with high-fructose corn syrup. Summary Eating a low-purine diet can help control conditions caused by too much  uric acid in the body, such as gout or kidney stones. Choose low-purine foods, limit alcohol, and limit foods high in fat. You will learn over time which foods do or do not affect you. If you find out that a food tends to cause your gout symptoms to flare up, avoid eating that food. This information is not intended to replace advice given to you by your health care provider. Make sure you discuss any questions you have with your health care provider. Document Released: 09/30/2010 Document Revised: 05/18/2017 Document Reviewed: 07/19/2016 Elsevier Patient Education  2020 [employer].

## 2019-01-16 ENCOUNTER — Other Ambulatory Visit: Payer: Self-pay

## 2019-01-22 ENCOUNTER — Other Ambulatory Visit: Payer: Self-pay

## 2019-01-22 ENCOUNTER — Other Ambulatory Visit: Payer: Self-pay | Admitting: Gerontology

## 2019-01-22 DIAGNOSIS — R899 Unspecified abnormal finding in specimens from other organs, systems and tissues: Secondary | ICD-10-CM

## 2019-01-22 DIAGNOSIS — L298 Other pruritus: Secondary | ICD-10-CM

## 2019-01-22 MED ORDER — TRIAMCINOLONE ACETONIDE 0.1 % EX CREA: 1.0000 "application " | TOPICAL_CREAM | Freq: Two times a day (BID) | CUTANEOUS | 0 refills | Status: DC

## 2019-01-23 ENCOUNTER — Telehealth: Payer: Self-pay | Admitting: Pharmacist

## 2019-01-23 LAB — URINALYSIS
Bilirubin, UA: NEGATIVE
Glucose, UA: NEGATIVE
Ketones, UA: NEGATIVE
Leukocytes,UA: NEGATIVE
Nitrite, UA: NEGATIVE
Protein,UA: NEGATIVE
RBC, UA: NEGATIVE
Specific Gravity, UA: 1.015 (ref 1.005–1.030)
Urobilinogen, Ur: 0.2 mg/dL (ref 0.2–1.0)
pH, UA: 5 (ref 5.0–7.5)

## 2019-01-23 LAB — LIPID PANEL
Chol/HDL Ratio: 4.9 ratio (ref 0.0–5.0)
Cholesterol, Total: 170 mg/dL (ref 100–199)
HDL: 35 mg/dL — ABNORMAL LOW (ref >39)
LDL Calculated: 93 mg/dL (ref 0–99)
Triglycerides: 211 mg/dL — ABNORMAL HIGH (ref 0–149)
VLDL Cholesterol Cal: 42 mg/dL — ABNORMAL HIGH (ref 5–40)

## 2019-01-23 NOTE — Telephone Encounter (Signed)
01/23/2019 1:26:23 PM - Ventolin refill online with [facility]  01/23/2019 Placed refill for Ventolin HFA online with [facility], to release 02/20/2019, order# [id number].[place]

## 2019-02-25 ENCOUNTER — Ambulatory Visit: Payer: Self-pay

## 2019-04-03 ENCOUNTER — Ambulatory Visit: Payer: Self-pay | Admitting: Gerontology

## 2019-04-03 ENCOUNTER — Encounter: Payer: Self-pay | Admitting: Gerontology

## 2019-04-03 ENCOUNTER — Other Ambulatory Visit: Payer: Self-pay

## 2019-04-03 VITALS — Ht 70.0 in | Wt 245.0 lb

## 2019-04-03 DIAGNOSIS — I10 Essential (primary) hypertension: Secondary | ICD-10-CM

## 2019-04-03 DIAGNOSIS — M1000 Idiopathic gout, unspecified site: Secondary | ICD-10-CM

## 2019-04-03 DIAGNOSIS — K219 Gastro-esophageal reflux disease without esophagitis: Secondary | ICD-10-CM

## 2019-04-03 DIAGNOSIS — Z0000 Encounter for general adult medical examination without abnormal findings: Secondary | ICD-10-CM

## 2019-04-03 MED ORDER — LISINOPRIL 10 MG PO TABS: 10.0000 mg | ORAL_TABLET | Freq: Every day | ORAL | 4 refills | Status: DC

## 2019-04-03 MED ORDER — OMEPRAZOLE 20 MG PO CPDR: 20.0000 mg | DELAYED_RELEASE_CAPSULE | Freq: Every day | ORAL | 4 refills | Status: DC

## 2019-04-03 MED ORDER — AMLODIPINE BESYLATE 5 MG PO TABS: 5.0000 mg | ORAL_TABLET | Freq: Every day | ORAL | 4 refills | Status: DC

## 2019-04-03 MED ORDER — ALLOPURINOL 100 MG PO TABS: 100.0000 mg | ORAL_TABLET | Freq: Every day | ORAL | 4 refills | Status: DC

## 2019-04-03 NOTE — Patient Instructions (Signed)
Fat and Cholesterol Restricted Eating Plan Getting too much fat and cholesterol in your diet may cause health problems. Choosing the right foods helps keep your fat and cholesterol at normal levels. This can keep you from getting certain diseases. Your doctor may recommend an eating plan that includes:  Total fat: ______% or less of total calories a day.  Saturated fat: ______% or less of total calories a day.  Cholesterol: less than _________mg a day.  Fiber: ______g a day. What are tips for following this plan? Meal planning  At meals, divide your plate into four equal parts: ? Fill one-half of your plate with vegetables and green salads. ? Fill one-fourth of your plate with whole grains. ? Fill one-fourth of your plate with low-fat (lean) protein foods.  Eat fish that is high in omega-3 fats at least two times a week. This includes mackerel, tuna, sardines, and salmon.  Eat foods that are high in fiber, such as whole grains, beans, apples, broccoli, carrots, peas, and barley. General tips   Work with your doctor to lose weight if you need to.  Avoid: ? Foods with added sugar. ? Fried foods. ? Foods with partially hydrogenated oils.  Limit alcohol intake to no more than 1 drink a day for nonpregnant women and 2 drinks a day for men. One drink equals 12 oz of beer, 5 oz of wine, or 1 oz of hard liquor. Reading food labels  Check food labels for: ? Trans fats. ? Partially hydrogenated oils. ? Saturated fat (g) in each serving. ? Cholesterol (mg) in each serving. ? Fiber (g) in each serving.  Choose foods with healthy fats, such as: ? Monounsaturated fats. ? Polyunsaturated fats. ? Omega-3 fats.  Choose grain products that have whole grains. Look for the word "whole" as the first word in the ingredient list. Cooking  Cook foods using low-fat methods. These include baking, boiling, grilling, and broiling.  Eat more home-cooked foods. Eat at restaurants and buffets  less often.  Avoid cooking using saturated fats, such as butter, cream, palm oil, palm kernel oil, and coconut oil. Recommended foods  Fruits  All fresh, canned (in natural juice), or frozen fruits. Vegetables  Fresh or frozen vegetables (raw, steamed, roasted, or grilled). Green salads. Grains  Whole grains, such as whole wheat or whole grain breads, crackers, cereals, and pasta. Unsweetened oatmeal, bulgur, barley, quinoa, or brown rice. Corn or whole wheat flour tortillas. Meats and other protein foods  Ground beef (85% or leaner), grass-fed beef, or beef trimmed of fat. Skinless chicken or [place]. Ground chicken or [place]. Pork trimmed of fat. All fish and seafood. Egg whites. Dried beans, peas, or lentils. Unsalted nuts or seeds. Unsalted canned beans. Nut butters without added sugar or oil. Dairy  Low-fat or nonfat dairy products, such as skim or 1% milk, 2% or reduced-fat cheeses, low-fat and fat-free ricotta or cottage cheese, or plain low-fat and nonfat yogurt. Fats and oils  Tub margarine without trans fats. Light or reduced-fat mayonnaise and salad dressings. Avocado. Olive, canola, sesame, or safflower oils. The items listed above may not be a complete list of foods and beverages you can eat. Contact a dietitian for more information. Foods to avoid Fruits  Canned fruit in heavy syrup. Fruit in cream or butter sauce. Fried fruit. Vegetables  Vegetables cooked in cheese, cream, or butter sauce. Fried vegetables. Grains  White bread. White pasta. White rice. Cornbread. Bagels, pastries, and croissants. Crackers and snack foods that contain trans fat  and hydrogenated oils. °Meats and other protein foods °· Fatty cuts of meat. Ribs, chicken wings, bacon, sausage, bologna, salami, chitterlings, fatback, hot dogs, bratwurst, and packaged lunch meats. Liver and organ meats. Whole eggs and egg yolks. Chicken and turkey with skin. Fried meat. °Dairy °· Whole or 2% milk, cream,  half-and-half, and cream cheese. Whole milk cheeses. Whole-fat or sweetened yogurt. Full-fat cheeses. Nondairy creamers and whipped toppings. Processed cheese, cheese spreads, and cheese curds. °Beverages °· Alcohol. Sugar-sweetened drinks such as sodas, lemonade, and fruit drinks. °Fats and oils °· Butter, stick margarine, lard, shortening, ghee, or bacon fat. Coconut, palm kernel, and palm oils. °Sweets and desserts °· Corn syrup, sugars, honey, and molasses. Candy. Jam and jelly. Syrup. Sweetened cereals. Cookies, pies, cakes, donuts, muffins, and ice cream. °The items listed above may not be a complete list of foods and beverages you should avoid. Contact a dietitian for more information. °Summary °· Choosing the right foods helps keep your fat and cholesterol at normal levels. This can keep you from getting certain diseases. °· At meals, fill one-half of your plate with vegetables and green salads. °· Eat high-fiber foods, like whole grains, beans, apples, carrots, peas, and barley. °· Limit added sugar, saturated fats, alcohol, and fried foods. °This information is not intended to replace advice given to you by your health care provider. Make sure you discuss any questions you have with your health care provider. °Document Released: 12/05/2011 Document Revised: 02/06/2018 Document Reviewed: 02/20/2017 °Elsevier Patient Education © 2020 Elsevier Inc. °DASH Eating Plan °DASH stands for "Dietary Approaches to Stop Hypertension." The DASH eating plan is a healthy eating plan that has been shown to reduce high blood pressure (hypertension). It may also reduce your risk for type 2 diabetes, heart disease, and stroke. The DASH eating plan may also help with weight loss. °What are tips for following this plan? ° °General guidelines °· Avoid eating more than 2,300 mg (milligrams) of salt (sodium) a day. If you have hypertension, you may need to reduce your sodium intake to 1,500 mg a day. °· Limit alcohol intake to no  more than 1 drink a day for nonpregnant women and 2 drinks a day for men. One drink equals 12 oz of beer, 5 oz of wine, or 1½ oz of hard liquor. °· Work with your health care provider to maintain a healthy body weight or to lose weight. Ask what an ideal weight is for you. °· Get at least 30 minutes of exercise that causes your heart to beat faster (aerobic exercise) most days of the week. Activities may include walking, swimming, or biking. °· Work with your health care provider or diet and nutrition specialist (dietitian) to adjust your eating plan to your individual calorie needs. °Reading food labels ° °· Check food labels for the amount of sodium per serving. Choose foods with less than 5 percent of the Daily Value of sodium. Generally, foods with less than 300 mg of sodium per serving fit into this eating plan. °· To find whole grains, look for the word "whole" as the first word in the ingredient list. °Shopping °· Buy products labeled as "low-sodium" or "no salt added." °· Buy fresh foods. Avoid canned foods and premade or frozen meals. °Cooking °· Avoid adding salt when cooking. Use salt-free seasonings or herbs instead of table salt or sea salt. Check with your health care provider or pharmacist before using salt substitutes. °· Do not fry foods. Cook foods using healthy methods such as   baking, boiling, grilling, and broiling instead. °· Cook with heart-healthy oils, such as olive, canola, soybean, or sunflower oil. °Meal planning °· Eat a balanced diet that includes: °? 5 or more servings of fruits and vegetables each day. At each meal, try to fill half of your plate with fruits and vegetables. °? Up to 6-8 servings of whole grains each day. °? Less than 6 oz of lean meat, poultry, or fish each day. A 3-oz serving of meat is about the same size as a deck of cards. One egg equals 1 oz. °? 2 servings of low-fat dairy each day. °? A serving of nuts, seeds, or beans 5 times each week. °? Heart-healthy fats.  Healthy fats called Omega-3 fatty acids are found in foods such as flaxseeds and coldwater fish, like sardines, salmon, and mackerel. °· Limit how much you eat of the following: °? Canned or prepackaged foods. °? Food that is high in trans fat, such as fried foods. °? Food that is high in saturated fat, such as fatty meat. °? Sweets, desserts, sugary drinks, and other foods with added sugar. °? Full-fat dairy products. °· Do not salt foods before eating. °· Try to eat at least 2 vegetarian meals each week. °· Eat more home-cooked food and less restaurant, buffet, and fast food. °· When eating at a restaurant, ask that your food be prepared with less salt or no salt, if possible. °What foods are recommended? °The items listed may not be a complete list. Talk with your dietitian about what dietary choices are best for you. °Grains °Whole-grain or whole-wheat bread. Whole-grain or whole-wheat pasta. Brown rice. Oatmeal. Quinoa. Bulgur. Whole-grain and low-sodium cereals. Pita bread. Low-fat, low-sodium crackers. Whole-wheat flour tortillas. °Vegetables °Fresh or frozen vegetables (raw, steamed, roasted, or grilled). Low-sodium or reduced-sodium tomato and vegetable juice. Low-sodium or reduced-sodium tomato sauce and tomato paste. Low-sodium or reduced-sodium canned vegetables. °Fruits °All fresh, dried, or frozen fruit. Canned fruit in natural juice (without added sugar). °Meat and other protein foods °Skinless chicken or turkey. Ground chicken or turkey. Pork with fat trimmed off. Fish and seafood. Egg whites. Dried beans, peas, or lentils. Unsalted nuts, nut butters, and seeds. Unsalted canned beans. Lean cuts of beef with fat trimmed off. Low-sodium, lean deli meat. °Dairy °Low-fat (1%) or fat-free (skim) milk. Fat-free, low-fat, or reduced-fat cheeses. Nonfat, low-sodium ricotta or cottage cheese. Low-fat or nonfat yogurt. Low-fat, low-sodium cheese. °Fats and oils °Soft margarine without trans fats. Vegetable  oil. Low-fat, reduced-fat, or light mayonnaise and salad dressings (reduced-sodium). Canola, safflower, olive, soybean, and sunflower oils. Avocado. °Seasoning and other foods °Herbs. Spices. Seasoning mixes without salt. Unsalted popcorn and pretzels. Fat-free sweets. °What foods are not recommended? °The items listed may not be a complete list. Talk with your dietitian about what dietary choices are best for you. °Grains °Baked goods made with fat, such as croissants, muffins, or some breads. Dry pasta or rice meal packs. °Vegetables °Creamed or fried vegetables. Vegetables in a cheese sauce. Regular canned vegetables (not low-sodium or reduced-sodium). Regular canned tomato sauce and paste (not low-sodium or reduced-sodium). Regular tomato and vegetable juice (not low-sodium or reduced-sodium). Pickles. Olives. °Fruits °Canned fruit in a light or heavy syrup. Fried fruit. Fruit in cream or butter sauce. °Meat and other protein foods °Fatty cuts of meat. Ribs. Fried meat. Bacon. Sausage. Bologna and other processed lunch meats. Salami. Fatback. Hotdogs. Bratwurst. Salted nuts and seeds. Canned beans with added salt. Canned or smoked fish. Whole eggs or egg yolks.   Chicken or turkey with skin. °Dairy °Whole or 2% milk, cream, and half-and-half. Whole or full-fat cream cheese. Whole-fat or sweetened yogurt. Full-fat cheese. Nondairy creamers. Whipped toppings. Processed cheese and cheese spreads. °Fats and oils °Butter. Stick margarine. Lard. Shortening. Ghee. Bacon fat. Tropical oils, such as coconut, palm kernel, or palm oil. °Seasoning and other foods °Salted popcorn and pretzels. Onion salt, garlic salt, seasoned salt, table salt, and sea salt. Worcestershire sauce. Tartar sauce. Barbecue sauce. Teriyaki sauce. Soy sauce, including reduced-sodium. Steak sauce. Canned and packaged gravies. Fish sauce. Oyster sauce. Cocktail sauce. Horseradish that you find on the shelf. Ketchup. Mustard. Meat flavorings and  tenderizers. Bouillon cubes. Hot sauce and Tabasco sauce. Premade or packaged marinades. Premade or packaged taco seasonings. Relishes. Regular salad dressings. °Where to find more information: °· National Heart, Lung, and Blood Institute: www.nhlbi.nih.gov °· American Heart Association: www.heart.org °Summary °· The DASH eating plan is a healthy eating plan that has been shown to reduce high blood pressure (hypertension). It may also reduce your risk for type 2 diabetes, heart disease, and stroke. °· With the DASH eating plan, you should limit salt (sodium) intake to 2,300 mg a day. If you have hypertension, you may need to reduce your sodium intake to 1,500 mg a day. °· When on the DASH eating plan, aim to eat more fresh fruits and vegetables, whole grains, lean proteins, low-fat dairy, and heart-healthy fats. °· Work with your health care provider or diet and nutrition specialist (dietitian) to adjust your eating plan to your individual calorie needs. °This information is not intended to replace advice given to you by your health care provider. Make sure you discuss any questions you have with your health care provider. °Document Released: 05/25/2011 Document Revised: 05/18/2017 Document Reviewed: 05/29/2016 °Elsevier Patient Education © 2020 Elsevier Inc. ° °

## 2019-04-03 NOTE — Progress Notes (Signed)
Established Patient Office Visit  Subjective:  Patient ID: Albert Hill, male    DOB: 1976/02/15  Age: 43 y.o. MRN: [id number]  CC:  Chief Complaint  Patient presents with  . Hypertension  Patient consents to telephone visit and 2 patients identifiers was used to identify patient.  HPI Albert Hill presents for follow up of hypertension, Jerrye Bushy and Gout. He's compliant with his medications, checks his blood pressure weekly and it was 135/80. His Lipid panel done on 01/22/2019, Triglycerides was 211 and HDL was 35 mg/dl. He states that he's making healthy food choices, and exercising as tolerated. He reports receiving his Influenza vaccine from [facility]. He denies gout flare and states that acid reflux is controlled by taking Omeprazole. He denies chest pain, palpitation, cough, peripheral edema, fever, chills and offers no further complaints.     Past Medical History:  Diagnosis Date  . GERD (gastroesophageal reflux disease)   . Gout   . Hypertension   . Umbilical hernia without obstruction and without gangrene     Past Surgical History:  Procedure Laterality Date  . CYST EXCISION     Patient states he was 43 years old  . UMBILICAL HERNIA REPAIR N/A 07/26/2018   Procedure: OPEN HERNIA REPAIR UMBILICAL ADULT WITH MESH;  Surgeon: Vickie Epley, MD;  Location: [facility] ORS;  Service: General;  Laterality: N/A;    Family History  Problem Relation Age of Onset  . Lung cancer Father     Social History   Socioeconomic History  . Marital status: Divorced    Spouse name: Not on file  . Number of children: 1  . Years of education: Not on file  . Highest education level: Not on file  Occupational History  . Not on file  Social Needs  . Financial resource strain: Not hard at all  . Food insecurity    Worry: Never true    Inability: Never true  . Transportation needs    Medical: No    Non-medical: No  Tobacco Use  . Smoking status: Current Every Day Smoker   Packs/day: 1.00    Years: 20.00    Pack years: 20.00    Types: Cigarettes  . Smokeless tobacco: Current User    Types: Chew  Substance and Sexual Activity  . Alcohol use: Not Currently    Comment: occasional  . Drug use: No  . Sexual activity: Not Currently  Lifestyle  . Physical activity    Days per week: 4 days    Minutes per session: 150+ min  . Stress: Very much  Relationships  . Social connections    Talks on phone: More than three times a week    Gets together: More than three times a week    Attends religious service: 1 to 4 times per year    Active member of club or organization: No    Attends meetings of clubs or organizations: Never    Relationship status: Divorced  . Intimate partner violence    Fear of current or ex partner: No    Emotionally abused: No    Physically abused: No    Forced sexual activity: No  Other Topics Concern  . Not on file  Social History Narrative  . Not on file    Outpatient Medications Prior to Visit  Medication Sig Dispense Refill  . acetaminophen (TYLENOL) 500 MG tablet Take 1,000 mg by mouth daily as needed for moderate pain.    Marland Kitchen albuterol (PROVENTIL HFA;VENTOLIN  HFA) 108 (90 Base) MCG/ACT inhaler Inhale 2 puffs into the lungs every 6 (six) hours as needed for wheezing or shortness of breath. 1 Inhaler 5  . fluticasone (FLONASE) 50 MCG/ACT nasal spray Place 1 spray into both nostrils 2 (two) times daily. 16 g 0  . Multiple Vitamin (MULTIVITAMIN WITH MINERALS) TABS tablet Take 1 tablet by mouth daily.    Marland Kitchen triamcinolone cream (KENALOG) 0.1 % Apply 1 application topically 2 (two) times daily. 30 g 0  . allopurinol (ZYLOPRIM) 100 MG tablet Take 1 tablet (100 mg total) by mouth daily. 30 tablet 2  . amLODipine (NORVASC) 5 MG tablet Take 1 tablet (5 mg total) by mouth daily. 30 tablet 2  . lisinopril (ZESTRIL) 10 MG tablet Take 1 tablet (10 mg total) by mouth daily. 30 tablet 2  . omeprazole (PRILOSEC) 20 MG capsule Take 1 capsule (20 mg  total) by mouth daily. 30 capsule 2  . brompheniramine-pseudoephedrine-DM 30-2-10 MG/5ML syrup Take 10 mLs by mouth 4 (four) times daily as needed. 200 mL 0   No facility-administered medications prior to visit.     No Known Allergies  ROS Review of Systems  Constitutional: Negative.   HENT: Negative.   Eyes: Negative.   Respiratory: Negative.   Cardiovascular: Negative.   Gastrointestinal: Negative.   Genitourinary: Negative.   Musculoskeletal: Negative.   Skin: Negative.   Neurological: Negative.   Psychiatric/Behavioral: Negative.       Objective:    Physical Exam No vital sign or PE done Ht 5' 10"  (1.778 m)   Wt 245 lb (111.1 kg) Comment: pt stated  BMI 35.15 kg/m  Wt Readings from Last 3 Encounters:  04/03/19 245 lb (111.1 kg)  08/22/18 235 lb (106.6 kg)  08/08/18 233 lb 3.2 oz (105.8 kg)   He was encouraged to continue on a weight loss regimen.  Health Maintenance Due  Topic Date Due  . HIV Screening  05/23/1989  . TETANUS/TDAP  05/23/1993    There are no preventive care reminders to display for this patient.  Lab Results  Component Value Date   TSH 1.960 06/06/2018   Lab Results  Component Value Date   WBC 9.5 08/29/2018   HGB 13.2 08/29/2018   HCT 37.2 (L) 08/29/2018   MCV 88 08/29/2018   PLT 297 08/29/2018   Lab Results  Component Value Date   NA 138 08/29/2018   K 4.1 08/29/2018   CO2 19 (L) 08/29/2018   GLUCOSE 125 (H) 08/29/2018   BUN 18 08/29/2018   CREATININE 1.06 08/29/2018   BILITOT <0.2 08/29/2018   ALKPHOS 82 08/29/2018   AST 22 08/29/2018   ALT 29 08/29/2018   PROT 6.8 08/29/2018   ALBUMIN 4.5 08/29/2018   CALCIUM 9.0 08/29/2018   Lab Results  Component Value Date   CHOL 170 01/22/2019   Lab Results  Component Value Date   HDL 35 (L) 01/22/2019   Lab Results  Component Value Date   LDLCALC 93 01/22/2019   Lab Results  Component Value Date   TRIG 211 (H) 01/22/2019   Lab Results  Component Value Date    CHOLHDL 4.9 01/22/2019   Lab Results  Component Value Date   HGBA1C 5.4 06/06/2018      Assessment & Plan:     1. Idiopathic gout, unspecified chronicity, unspecified site -He will continue on current treatment regimen. - allopurinol (ZYLOPRIM) 100 MG tablet; Take 1 tablet (100 mg total) by mouth daily.  Dispense: 30 tablet; Refill:  4  2. Essential hypertension -He will continue on current treatment regimen, his blood pressure is controlled. - amLODipine (NORVASC) 5 MG tablet; Take 1 tablet (5 mg total) by mouth daily.  Dispense: 30 tablet; Refill: 4 - lisinopril (ZESTRIL) 10 MG tablet; Take 1 tablet (10 mg total) by mouth daily.  Dispense: 30 tablet; Refill: 4 -Low salt DASH diet -Exercise regularly as tolerated -Check blood pressure at least once a week at home or a nearby pharmacy record and bring to follow up appointment. -Goal is less than 140/90 and normal blood pressure is 120/80  3. Gastroesophageal reflux disease without esophagitis - He will continue on current treatment regimen. - omeprazole (PRILOSEC) 20 MG capsule; Take 1 capsule (20 mg total) by mouth daily.  Dispense: 30 capsule; Refill: 4  4. Healthcare maintenance  - Lipid panel; Future - Comp Met (CMET); Future - HgB A1c; Future   Follow-up: Return in about 4 months (around 08/04/2019), or if symptoms worsen or fail to improve.    Dolan Xia Jerold Coombe, NP

## 2019-04-16 ENCOUNTER — Telehealth: Payer: Self-pay | Admitting: Pharmacist

## 2019-04-16 NOTE — Telephone Encounter (Signed)
04/16/2019 9:00:42 AM - [place] form to patient for renewal  04/16/2019 Patient enrollment with [facility] expires [id number] [place] renewal application -- mailing patient his portion to sign & return.AJ  04/16/2019 8:59:38 AM - Ventolin script to provider  04/16/2019 Printed script for Ventolin HFA 76mcg Inhale 2 puffs into the lungs every 6 hours as needed for wheezing or shortness of breath--putting in [facility] folder for Benjamine Mola to sign. Patient enrollment with [facility] expires 07/18/2019-this is for Renewal/Refill.Delos Haring

## 2019-04-28 ENCOUNTER — Telehealth: Payer: Self-pay | Admitting: Pharmacist

## 2019-04-28 NOTE — Telephone Encounter (Signed)
04/28/2019 11:24:34 AM - [facility] call to Bernita Buffy  04/28/2019 Called [facility] for refill on [facility]--allow 10-14 business days to receive.Delos Haring

## 2019-04-30 ENCOUNTER — Telehealth: Payer: Self-pay | Admitting: Pharmacist

## 2019-04-30 NOTE — Telephone Encounter (Signed)
04/30/2019 9:15:34 AM - Returned mail-Ventolin  04/30/2019 I have received returned mail Patient's address [address]. [place], [place] [address]--This address is in [facility], the address in [facility] is [place]. [place], [place] [address]. Patient has meds on the wall to pick up, putting envelope in bag to be given to patient-Need to Verify address.Delos Haring

## 2019-07-14 ENCOUNTER — Telehealth: Payer: Self-pay | Admitting: Pharmacist

## 2019-07-14 NOTE — Telephone Encounter (Signed)
07/14/2019 10:43:18 AM - [facility] renewal application to pat & dr  07/14/2019 Time for patient to renew with Bernita Buffy for [place] patient his portion of application to sign & return, also need current household income/support & taxes/[id number], sending provider portion to [facility].Delos Haring

## 2019-09-27 ENCOUNTER — Ambulatory Visit: Payer: Self-pay

## 2019-10-10 ENCOUNTER — Ambulatory Visit: Payer: Self-pay | Attending: Internal Medicine

## 2019-10-10 DIAGNOSIS — Z23 Encounter for immunization: Secondary | ICD-10-CM

## 2019-10-10 NOTE — Progress Notes (Signed)
   Covid-19 Vaccination Clinic  Name:  PAITON ZIEHM    MRN: [id number] DOB: May 17, 1976  10/10/2019  Mr. Rossi was observed post Covid-19 immunization for 15 minutes without incident. He was provided with Vaccine Information Sheet and instruction to access the V-Safe system.   Mr. Ragosta was instructed to call 911 with any severe reactions post vaccine: Marland Kitchen Difficulty breathing  . Swelling of face and throat  . A fast heartbeat  . A bad rash all over body  . Dizziness and weakness   Immunizations Administered    Name Date Dose VIS Date Route   Pfizer COVID-19 Vaccine 10/10/2019 12:17 PM 0.3 mL 08/13/2018 Intramuscular   Manufacturer: [place]   Lot: [id number]   [place]: [id number]

## 2019-11-03 ENCOUNTER — Ambulatory Visit: Payer: Self-pay | Attending: Internal Medicine

## 2019-11-03 DIAGNOSIS — Z23 Encounter for immunization: Secondary | ICD-10-CM

## 2019-11-03 NOTE — Progress Notes (Signed)
.     Covid-19 Vaccination Clinic  Name:  JEANPHILIPPE KYLE    MRN: [id number] DOB: 1976-05-05  11/03/2019  Mr. Shank was observed post Covid-19 immunization for 15 minutes without incident. He was provided with Vaccine Information Sheet and instruction to access the V-Safe system.   Mr. Rodela was instructed to call 911 with any severe reactions post vaccine: Marland Kitchen Difficulty breathing  . Swelling of face and throat  . A fast heartbeat  . A bad rash all over body  . Dizziness and weakness   Immunizations Administered    Name Date Dose VIS Date Route   Pfizer COVID-19 Vaccine 11/03/2019  1:47 PM 0.3 mL 08/13/2018 Intramuscular   Manufacturer: [place]   Lot: [id number]   [place]: [id number]

## 2019-11-08 ENCOUNTER — Ambulatory Visit: Payer: Self-pay

## 2019-11-19 ENCOUNTER — Ambulatory Visit: Payer: Self-pay

## 2019-12-12 ENCOUNTER — Other Ambulatory Visit: Payer: Self-pay

## 2019-12-12 ENCOUNTER — Ambulatory Visit (INDEPENDENT_AMBULATORY_CARE_PROVIDER_SITE_OTHER): Payer: BC Managed Care – PPO | Admitting: Surgery

## 2019-12-12 ENCOUNTER — Encounter: Payer: Self-pay | Admitting: Surgery

## 2019-12-12 VITALS — BP 145/94 | HR 101 | Temp 99.0°F | Resp 12 | Ht 70.0 in | Wt 243.0 lb

## 2019-12-12 DIAGNOSIS — D170 Benign lipomatous neoplasm of skin and subcutaneous tissue of head, face and neck: Secondary | ICD-10-CM | POA: Diagnosis not present

## 2019-12-12 NOTE — Patient Instructions (Addendum)
Please call our office if you have any questions or concerns. Please return the phone call to [facility] to schedule an appointment.  If you want to move forward with having the Lipoma removed call our office.

## 2019-12-12 NOTE — Progress Notes (Signed)
12/12/2019  Reason for Visit:  Lipoma of neck  Referring Provider:  Caryl Asp, NP  History of Present Illness: Albert Hill is a 44 y.o. male presenting for evaluation of a posterior left neck mass.  Patient reports that he noticed this a few months ago and reports that since then he has also been noticing progressively worsening left-sided shoulder and neck pain.  He reports that he is having a hard time finding a comfortable position where there is no pain.  He reports that the pain is at his neck radiating towards the shoulder and upper portion of the chest and the upper portion of the back around the shoulder area with some radiation towards the upper arm.  This changes with position.  Denies any symptoms on the right side.  Denies any weakness.  He is unsure if this mass is getting larger and the mass itself does not appear to be tender.  He denies any redness or drainage from this area.  He does report that he had a previous cyst in his lower back that was removed when he was much younger.  Denies any recent trauma, but he has worked in [employer] for a long time.  He has been evaluated by his PCP a couple of times about this and the referral was sent both to [place] for the mass as well as [facility].  Past Medical History: Past Medical History:  Diagnosis Date  . GERD (gastroesophageal reflux disease)   . Gout   . Hypertension   . Umbilical hernia without obstruction and without gangrene      Past Surgical History: Past Surgical History:  Procedure Laterality Date  . CYST EXCISION     Patient states he was 44 years old  . UMBILICAL HERNIA REPAIR N/A 07/26/2018   Procedure: OPEN HERNIA REPAIR UMBILICAL ADULT WITH MESH;  Surgeon: Vickie Epley, MD;  Location: [facility] ORS;  Service: General;  Laterality: N/A;    Home Medications: Prior to Admission medications   Medication Sig Start Date End Date Taking? Authorizing Provider  acetaminophen (TYLENOL) 500 MG tablet Take  1,000 mg by mouth daily as needed for moderate pain.   Yes [provider]  albuterol (PROVENTIL HFA;VENTOLIN HFA) 108 (90 Base) MCG/ACT inhaler Inhale 2 puffs into the lungs every 6 (six) hours as needed for wheezing or shortness of breath. 08/22/18  Yes Doles-Johnson, Teah, NP  allopurinol (ZYLOPRIM) 100 MG tablet Take 1 tablet (100 mg total) by mouth daily. 04/03/19  Yes Iloabachie, Chioma E, NP  amLODipine (NORVASC) 5 MG tablet Take 1 tablet (5 mg total) by mouth daily. 04/03/19  Yes Iloabachie, Chioma E, NP  BREZTRI AEROSPHERE 160-9-4.8 MCG/ACT AERO Inhale 2 puffs into the lungs 2 (two) times daily. 11/19/19  Yes [provider]  fluticasone (FLONASE) 50 MCG/ACT nasal spray Place 1 spray into both nostrils 2 (two) times daily. 09/05/18  Yes Cuthriell, Charline Bills, PA-C  gabapentin (NEURONTIN) 300 MG capsule Take 300 mg by mouth 3 (three) times daily. 11/30/19  Yes [provider]  lisinopril (ZESTRIL) 10 MG tablet Take 1 tablet (10 mg total) by mouth daily. 04/03/19  Yes Iloabachie, Chioma E, NP  meloxicam (MOBIC) 7.5 MG tablet Take 7.5 mg by mouth 2 (two) times daily. 11/03/19  Yes [provider]  Multiple Vitamin (MULTIVITAMIN WITH MINERALS) TABS tablet Take 1 tablet by mouth daily.   Yes [provider]  omeprazole (PRILOSEC) 20 MG capsule Take 1 capsule (20 mg total) by mouth daily.  04/03/19  Yes Iloabachie, Chioma E, NP    Allergies: No Known Allergies  Social History:  reports that he has been smoking cigarettes. He has a 20.00 pack-year smoking history. His smokeless tobacco use includes chew. He reports previous alcohol use. He reports that he does not use drugs.   Family History: Family History  Problem Relation Age of Onset  . Lung cancer Father     Review of Systems: Review of Systems  Constitutional: Negative for chills and fever.  HENT: Negative for hearing loss.   Respiratory: Negative for shortness of breath.   Cardiovascular:  Negative for chest pain.  Gastrointestinal: Negative for abdominal pain, nausea and vomiting.  Genitourinary: Negative for dysuria.  Musculoskeletal: Positive for myalgias and neck pain.       Left neck and shoulder pain  Skin: Negative for rash.  Neurological: Positive for tingling. Negative for focal weakness.  Psychiatric/Behavioral: Negative for depression.    Physical Exam BP (!) 145/94   Pulse (!) 101   Temp 99 F (37.2 C) (Oral)   Resp 12   Ht 5\' 10"  (1.778 m)   Wt 243 lb (110.2 kg)   SpO2 94%   BMI 34.87 kg/m  CONSTITUTIONAL: No acute distress HEENT:  Normocephalic, atraumatic, extraocular motion intact. NECK: Trachea is midline, and there is no jugular venous distension.  RESPIRATORY:  Lungs are clear, and breath sounds are equal bilaterally. Normal respiratory effort without pathologic use of accessory muscles. CARDIOVASCULAR: Heart is regular without murmurs, gallops, or rubs. GI: The abdomen is soft, nondistended, nontender.  MUSCULOSKELETAL:  Normal muscle strength and tone in all four extremities.  No peripheral edema or cyanosis. SKIN: The patient has a 3 cm relatively flat mass just left to the posterior midline upper neck, which is mobile and soft in nature without any tenderness to palpation, overlying erythema of the skin, or induration. NEUROLOGIC:  Motor and sensation is grossly normal.  The patient gets reproducible left neck and shoulder pain when lateral flexion to the left as well as posterior extension.  Cranial nerves are grossly intact. PSYCH:  Alert and oriented to person, place and time. Affect is normal.  Laboratory Analysis: No results found for this or any previous visit (from the past 24 hour(s)).  Imaging: No results found.  Assessment and Plan: This is a 44 y.o. male with a posterior left neck mass likely lipoma but also with worsening left neck and shoulder pain.  -Discussed with the patient that from the description of his symptoms, it  appears that the more related to spinal stenosis of some degree.  This could either be central stenosis versus foraminal stenosis of one of the nerve.  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  However I do not think this is related to the mass.  The mass is small relatively, soft, mobile, and well-circumscribed.  Discussed with him that I am happy to excise the mass if he wishes, but I do not think this will solve his problems.  Patient says that [facility] called him last week but he has yet to contact him back because he wanted to be seen by [place] first.  I suggested that he call [facility] back so he can set up an appointment.  Potentially he may need an imaging study such as MRI to determine the etiology of his pain.  Discussed with the patient that if at the end it appears that this mass truly is appropriate, he  can call us back so that we can set him up for an office procedure for excision.  At this point he is not interested in excision if that is not the culprit but also discussed with him that he can call at anytime if he changes his mind.  He is in agreement with this plan and all of his questions have been answered.  Face-to-face time spent with the patient and care providers was 40 minutes, with more than 50% of the time spent counseling, educating, and coordinating care of the patient.     Melvyn Neth, [facility]

## 2020-01-08 ENCOUNTER — Telehealth: Payer: Self-pay | Admitting: Pharmacy Technician

## 2020-01-08 NOTE — Telephone Encounter (Signed)
Patient has prescription drug coverage with [employer].  No longer meets [facility]'s eligibility criteria. Patient notified.  [facility]   [place], [place]  [address]  January 08, 2020    [name] [address] [place], [place]  [address]  Dear Mr. Arvanitis:  This is to inform you that you are no longer eligible to receive medication assistance at [facility].  The reason(s) are:    _____Your total gross monthly household income exceeds 250% of the Federal Poverty Level.   _____Tangible assets (savings, checking, stocks/bonds, pension, retirement, etc.) exceeds our limit  _____You are eligible to receive benefits from [facility], [facility] or HIV Medication            Assistance program _____You are eligible to receive benefits from a Medicare Part "D" plan __X__You have prescription insurance  _____You are not an [facility] resident _____Failure to provide all requested proof of income information for 2021.    We regret that we are unable to help you at this time.  If your prescription coverage is terminated, please contact [facility], so that we may reassess your eligibility for our program.  If you have questions, we may be contacted at [phone].  Thank you,  [facility]

## 2020-02-24 ENCOUNTER — Emergency Department
Admission: EM | Admit: 2020-02-24 | Discharge: 2020-02-24 | Disposition: A | Discharge status: Home / Self Care | Payer: BC Managed Care – PPO | Attending: Emergency Medicine | Admitting: Emergency Medicine

## 2020-02-24 ENCOUNTER — Encounter: Payer: Self-pay | Admitting: Emergency Medicine

## 2020-02-24 ENCOUNTER — Emergency Department: Payer: BC Managed Care – PPO

## 2020-02-24 ENCOUNTER — Other Ambulatory Visit: Payer: Self-pay

## 2020-02-24 DIAGNOSIS — I10 Essential (primary) hypertension: Secondary | ICD-10-CM | POA: Insufficient documentation

## 2020-02-24 DIAGNOSIS — R197 Diarrhea, unspecified: Secondary | ICD-10-CM | POA: Insufficient documentation

## 2020-02-24 DIAGNOSIS — R05 Cough: Secondary | ICD-10-CM | POA: Diagnosis not present

## 2020-02-24 DIAGNOSIS — R509 Fever, unspecified: Secondary | ICD-10-CM | POA: Diagnosis not present

## 2020-02-24 DIAGNOSIS — M6281 Muscle weakness (generalized): Secondary | ICD-10-CM | POA: Diagnosis not present

## 2020-02-24 DIAGNOSIS — F17210 Nicotine dependence, cigarettes, uncomplicated: Secondary | ICD-10-CM | POA: Insufficient documentation

## 2020-02-24 DIAGNOSIS — R0602 Shortness of breath: Secondary | ICD-10-CM | POA: Insufficient documentation

## 2020-02-24 DIAGNOSIS — B349 Viral infection, unspecified: Secondary | ICD-10-CM | POA: Diagnosis not present

## 2020-02-24 DIAGNOSIS — R531 Weakness: Secondary | ICD-10-CM

## 2020-02-24 DIAGNOSIS — Z20822 Contact with and (suspected) exposure to covid-19: Secondary | ICD-10-CM | POA: Insufficient documentation

## 2020-02-24 DIAGNOSIS — Z79899 Other long term (current) drug therapy: Secondary | ICD-10-CM | POA: Insufficient documentation

## 2020-02-24 DIAGNOSIS — R109 Unspecified abdominal pain: Secondary | ICD-10-CM | POA: Diagnosis present

## 2020-02-24 DIAGNOSIS — R112 Nausea with vomiting, unspecified: Secondary | ICD-10-CM | POA: Diagnosis not present

## 2020-02-24 DIAGNOSIS — R059 Cough, unspecified: Secondary | ICD-10-CM

## 2020-02-24 LAB — CBC
HCT: 43.5 % (ref 39.0–52.0)
Hemoglobin: 15.6 g/dL (ref 13.0–17.0)
MCH: 31.1 pg (ref 26.0–34.0)
MCHC: 35.9 g/dL (ref 30.0–36.0)
MCV: 86.8 fL (ref 80.0–100.0)
Platelets: 302 10*3/uL (ref 150–400)
RBC: 5.01 MIL/uL (ref 4.22–5.81)
RDW: 13.1 % (ref 11.5–15.5)
WBC: 14.2 10*3/uL — ABNORMAL HIGH (ref 4.0–10.5)
nRBC: 0 % (ref 0.0–0.2)

## 2020-02-24 LAB — COMPREHENSIVE METABOLIC PANEL
ALT: 29 U/L (ref 0–44)
AST: 18 U/L (ref 15–41)
Albumin: 4.5 g/dL (ref 3.5–5.0)
Alkaline Phosphatase: 76 U/L (ref 38–126)
Anion gap: 11 (ref 5–15)
BUN: 16 mg/dL (ref 6–20)
CO2: 24 mmol/L (ref 22–32)
Calcium: 9.2 mg/dL (ref 8.9–10.3)
Chloride: 98 mmol/L (ref 98–111)
Creatinine, Ser: 0.96 mg/dL (ref 0.61–1.24)
GFR calc Af Amer: >60 mL/min (ref >60)
GFR calc non Af Amer: >60 mL/min (ref >60)
Glucose, Bld: 107 mg/dL — ABNORMAL HIGH (ref 70–99)
Potassium: 4 mmol/L (ref 3.5–5.1)
Sodium: 133 mmol/L — ABNORMAL LOW (ref 135–145)
Total Bilirubin: 0.8 mg/dL (ref 0.3–1.2)
Total Protein: 7.7 g/dL (ref 6.5–8.1)

## 2020-02-24 LAB — SARS CORONAVIRUS 2 BY RT PCR (HOSPITAL ORDER, PERFORMED IN ~~LOC~~ HOSPITAL LAB): SARS Coronavirus 2: NEGATIVE

## 2020-02-24 LAB — LIPASE, BLOOD: Lipase: 61 U/L — ABNORMAL HIGH (ref 11–51)

## 2020-02-24 MED ORDER — SODIUM CHLORIDE 0.9 % IV BOLUS
1000.0000 mL | Freq: Once | INTRAVENOUS | Status: AC
  Administered 2020-02-24: 1000 mL via INTRAVENOUS

## 2020-02-24 MED ORDER — METHYLPREDNISOLONE 4 MG PO TBPK: ORAL_TABLET | ORAL | 0 refills | Status: DC

## 2020-02-24 MED ORDER — METHYLPREDNISOLONE 4 MG PO TBPK: ORAL_TABLET | ORAL | 0 refills | Status: AC

## 2020-02-24 NOTE — ED Provider Notes (Signed)
[facility] Emergency Department Provider Note  ____________________________________________   First MD Initiated Contact with Patient 02/24/20 [phone]     (approximate)  I have reviewed the triage vital signs and the nursing notes.   HISTORY  Chief Complaint Abdominal Pain and Weakness   HPI Albert Hill is a 44 y.o. male who presents for 7 days of worsening cough, congestion, SOB, DOE, N/V/D. Pt states that he has a SO at home with similar symptoms who works at an urgent care. Pt states that these symptoms started one wk ago with cough/congestion and mild SOB that has progressed into worsening DOE. Pt also started having n/v/d 4 days prior to arrival. Pt has albuterol inhaler at home for asthma/COPD that he states has not improved his symptoms. Pt endorses associated fever.  Denies vision changes, chest pain, abdominal pain, dysuria, or weakness, numbness/paresthesia in any extremity.    Past Medical History:  Diagnosis Date  . GERD (gastroesophageal reflux disease)   . Gout   . Hypertension   . Umbilical hernia without obstruction and without gangrene     Patient Active Problem List   Diagnosis Date Noted  . Gout of ankle 04/11/2018  . Penile lesion 04/11/2018  . Essential hypertension 01/31/2018    Past Surgical History:  Procedure Laterality Date  . CYST EXCISION     Patient states he was 44 years old  . UMBILICAL HERNIA REPAIR N/A 07/26/2018   Procedure: OPEN HERNIA REPAIR UMBILICAL ADULT WITH MESH;  Surgeon: Vickie Epley, MD;  Location: [facility] ORS;  Service: General;  Laterality: N/A;    Prior to Admission medications   Medication Sig Start Date End Date Taking? Authorizing Provider  acetaminophen (TYLENOL) 500 MG tablet Take 1,000 mg by mouth daily as needed for moderate pain.    [provider]  albuterol (PROVENTIL HFA;VENTOLIN HFA) 108 (90 Base) MCG/ACT inhaler Inhale 2 puffs into the lungs every 6 (six) hours as needed  for wheezing or shortness of breath. 08/22/18   Doles-Johnson, Teah, NP  allopurinol (ZYLOPRIM) 100 MG tablet Take 1 tablet (100 mg total) by mouth daily. 04/03/19   Iloabachie, Chioma E, NP  amLODipine (NORVASC) 5 MG tablet Take 1 tablet (5 mg total) by mouth daily. 04/03/19   Iloabachie, Chioma E, NP  BREZTRI AEROSPHERE 160-9-4.8 MCG/ACT AERO Inhale 2 puffs into the lungs 2 (two) times daily. 11/19/19   [provider]  fluticasone (FLONASE) 50 MCG/ACT nasal spray Place 1 spray into both nostrils 2 (two) times daily. 09/05/18   Cuthriell, Charline Bills, PA-C  gabapentin (NEURONTIN) 300 MG capsule Take 300 mg by mouth 3 (three) times daily. 11/30/19   [provider]  lisinopril (ZESTRIL) 10 MG tablet Take 1 tablet (10 mg total) by mouth daily. 04/03/19   Iloabachie, Chioma E, NP  meloxicam (MOBIC) 7.5 MG tablet Take 7.5 mg by mouth 2 (two) times daily. 11/03/19   [provider]  methylPREDNISolone (MEDROL DOSEPAK) 4 MG TBPK tablet Take 5 tablets (20 mg total) by mouth every morning for 1 day, THEN 4 tablets (16 mg total) every morning for 1 day, THEN 3 tablets (12 mg total) every morning for 1 day, THEN 2 tablets (8 mg total) every morning for 1 day, THEN 1 tablet (4 mg total) every morning for 1 day. 02/24/20 02/29/20  Naaman Plummer, MD  Multiple Vitamin (MULTIVITAMIN WITH MINERALS) TABS tablet Take 1 tablet by mouth daily.    [provider]  omeprazole (PRILOSEC) 20  MG capsule Take 1 capsule (20 mg total) by mouth daily. 04/03/19   Iloabachie, Chioma E, NP    Allergies Patient has no known allergies.  Family History  Problem Relation Age of Onset  . Lung cancer Father     Social History Social History   Tobacco Use  . Smoking status: Current Every Day Smoker    Packs/day: 1.00    Years: 20.00    Pack years: 20.00    Types: Cigarettes  . Smokeless tobacco: Current User    Types: Chew  Vaping Use  . Vaping Use: Never used  Substance Use Topics  .  Alcohol use: Not Currently    Comment: occasional  . Drug use: No    Review of Systems Constitutional: + fever/chills Eyes: No visual changes. ENT: + sore throat. Cardiovascular: Denies chest pain. Respiratory: Denies shortness of breath. Gastrointestinal: No abdominal pain.  No nausea, no vomiting.  No diarrhea.  No constipation. Genitourinary: Negative for dysuria. Musculoskeletal: Negative for back pain. Skin: Negative for rash. Neurological: Negative for headaches, focal weakness or numbness.  ____________________________________________   PHYSICAL EXAM:  VITAL SIGNS: ED Triage Vitals  Enc Vitals Group     BP 02/24/20 0830 (!) 141/96     Pulse Rate 02/24/20 0830 (!) 109     Resp 02/24/20 0830 18     Temp 02/24/20 0830 98.2 F (36.8 C)     Temp Source 02/24/20 0830 Oral     SpO2 02/24/20 0830 95 %     Weight 02/24/20 0830 235 lb (106.6 kg)     Height 02/24/20 0830 5\' 10"  (1.778 m)     Head Circumference --      Peak Flow --      Pain Score 02/24/20 0836 7     Pain Loc --      Pain Edu? --      Excl. in [place]? --    Constitutional: Alert and oriented. Well appearing and in no acute distress. Eyes: Conjunctivae are normal. PERRL. EOMI. Head: Atraumatic. Nose: No congestion/rhinnorhea. Mouth/Throat: Mucous membranes are moist.  Oropharynx non-erythematous. Neck: No stridor.   Cardiovascular: Normal rate, regular rhythm. Grossly normal heart sounds.  Good peripheral circulation. Respiratory: Normal respiratory effort.  No retractions. Mild end expiratory wheezes bilaterally. Gastrointestinal: Soft and nontender. No distention. No abdominal bruits. No CVA tenderness. Musculoskeletal: No lower extremity tenderness nor edema.  No joint effusions. Neurologic:  Normal speech and language. No gross focal neurologic deficits are appreciated. No gait instability. Skin:  Skin is warm, dry and intact. No rash noted. Psychiatric: Mood and affect are normal. Speech and behavior  are normal.  ____________________________________________   LABS (all labs ordered are listed, but only abnormal results are displayed)  Labs Reviewed  CBC - Abnormal; Notable for the following components:      Result Value   WBC 14.2 (*)    All other components within normal limits  LIPASE, BLOOD - Abnormal; Notable for the following components:   Lipase 61 (*)    All other components within normal limits  COMPREHENSIVE METABOLIC PANEL - Abnormal; Notable for the following components:   Sodium 133 (*)    Glucose, Bld 107 (*)    All other components within normal limits  SARS CORONAVIRUS 2 BY RT PCR (HOSPITAL ORDER, [place] [facility])  URINALYSIS, COMPLETE (UACMP) WITH MICROSCOPIC   ____________________________________________  EKG  ED ECG REPORT I, Naaman Plummer, the attending physician, personally viewed and interpreted this  ECG.  Date: 02/24/2020 EKG Time: 0833 Rate: 102 Rhythm: normal sinus rhythm QRS Axis: normal Intervals: normal ST/T Wave abnormalities: normal Narrative Interpretation: no evidence of acute ischemia  ____________________________________________  RADIOLOGY  ED MD interpretation: X-ray of the chest shows no evidence of acute abnormalities  Official radiology report(s): DG Chest Port 1 View  Result Date: 02/24/2020 CLINICAL DATA:  Shortness of breath. EXAM: PORTABLE CHEST 1 VIEW COMPARISON:  September 05, 2018. FINDINGS: The heart size and mediastinal contours are within normal limits. Both lungs are clear. No pneumothorax or pleural effusion is noted. The visualized skeletal structures are unremarkable. IMPRESSION: No active disease. Electronically Signed   By: Marijo Conception M.D.   On: 02/24/2020 09:27    ____________________________________________   PROCEDURES  Procedure(s) performed: None  .1-3 Lead EKG Interpretation Performed by: Naaman Plummer, MD Authorized by: Naaman Plummer, MD     Interpretation: normal      ECG rate assessment: normal     Rhythm: sinus rhythm     Ectopy: none     Conduction: normal      Critical Care performed: No  ____________________________________________   INITIAL IMPRESSION / ASSESSMENT AND PLAN / ED COURSE  @[facility] @   Patient is a 44 year old male who presents for 1week of cough, congestion, nausea/vomiting/diarrhea.  Differential diagnosis includes acute viral syndrome, Covid infection, appendicitis, colitis, acute gastroenteritis.  Based on history, physical exam, laboratory/radiologic evaluation, patient likely has viral syndrome and improved is symptoms after 1 L normal saline.  Patient states that he has Zofran at home and feels that he may benefit from a dose of steroids.  Patient will be prescribed a Medrol Dosepak and instructions to follow-up with his primary care provider if symptoms worsen.  Clinical Course as of Feb 23 1618  Tue Feb 24, 2020  0934 DG Chest Port 1 View [EB]  1055 Lipase(!): 61 [EB]    Clinical Course User Index [EB] Naaman Plummer, MD     ____________________________________________   FINAL CLINICAL IMPRESSION(S) / ED DIAGNOSES  Final diagnoses:  Generalized weakness  Cough in adult  Fever, unspecified fever cause  Acute viral syndrome     ED Discharge Orders         Ordered    methylPREDNISolone (MEDROL DOSEPAK) 4 MG TBPK tablet  Status:  Discontinued        02/24/20 1148    methylPREDNISolone (MEDROL DOSEPAK) 4 MG TBPK tablet        02/24/20 1201           Note:  This document was prepared using Dragon voice recognition software and may include unintentional dictation errors.    Naaman Plummer, MD 02/24/20 [phone]

## 2020-02-24 NOTE — ED Notes (Signed)
Pt signed printed d/c paperwork as topaz frozen.

## 2020-02-24 NOTE — ED Triage Notes (Signed)
Patient presents to the ED with cough, congestion x 1 week, abdominal pain, nausea, vomiting and diarrhea for the past several days.  Patient states he has been taking prescribed anti-nausea medication without relief and reports feeling very weak and tired.  Patient's wife was recently diagnosed with bronchitis and is complaining of shortness of breath.

## 2020-02-26 ENCOUNTER — Telehealth: Payer: Self-pay | Admitting: Gerontology

## 2020-02-26 NOTE — Telephone Encounter (Signed)
-----   Message from Langston Reusing, NP sent at 02/26/2020 10:16 AM EDT ----- Pls schedule a F/U appointment for Albert Hill, and make a telephone note. Thank you

## 2020-11-17 ENCOUNTER — Emergency Department
Admission: EM | Admit: 2020-11-17 | Discharge: 2020-11-17 | Disposition: A | Discharge status: Home / Self Care | Payer: BC Managed Care – PPO | Attending: Emergency Medicine | Admitting: Emergency Medicine

## 2020-11-17 ENCOUNTER — Encounter: Payer: Self-pay | Admitting: Emergency Medicine

## 2020-11-17 ENCOUNTER — Other Ambulatory Visit: Payer: Self-pay

## 2020-11-17 DIAGNOSIS — K219 Gastro-esophageal reflux disease without esophagitis: Secondary | ICD-10-CM | POA: Diagnosis not present

## 2020-11-17 DIAGNOSIS — Z79899 Other long term (current) drug therapy: Secondary | ICD-10-CM | POA: Diagnosis not present

## 2020-11-17 DIAGNOSIS — K2900 Acute gastritis without bleeding: Secondary | ICD-10-CM | POA: Insufficient documentation

## 2020-11-17 DIAGNOSIS — I10 Essential (primary) hypertension: Secondary | ICD-10-CM | POA: Diagnosis not present

## 2020-11-17 DIAGNOSIS — R531 Weakness: Secondary | ICD-10-CM | POA: Insufficient documentation

## 2020-11-17 DIAGNOSIS — F17210 Nicotine dependence, cigarettes, uncomplicated: Secondary | ICD-10-CM | POA: Insufficient documentation

## 2020-11-17 DIAGNOSIS — R112 Nausea with vomiting, unspecified: Secondary | ICD-10-CM

## 2020-11-17 LAB — COMPREHENSIVE METABOLIC PANEL WITH GFR
ALT: 14 U/L (ref 0–44)
AST: 15 U/L (ref 15–41)
Albumin: 4.5 g/dL (ref 3.5–5.0)
Alkaline Phosphatase: 89 U/L (ref 38–126)
Anion gap: 11 (ref 5–15)
BUN: 11 mg/dL (ref 6–20)
CO2: 29 mmol/L (ref 22–32)
Calcium: 9.7 mg/dL (ref 8.9–10.3)
Chloride: 94 mmol/L — ABNORMAL LOW (ref 98–111)
Creatinine, Ser: 0.96 mg/dL (ref 0.61–1.24)
GFR, Estimated: >60 mL/min
Glucose, Bld: 103 mg/dL — ABNORMAL HIGH (ref 70–99)
Potassium: 3.7 mmol/L (ref 3.5–5.1)
Sodium: 134 mmol/L — ABNORMAL LOW (ref 135–145)
Total Bilirubin: 0.6 mg/dL (ref 0.3–1.2)
Total Protein: 8.3 g/dL — ABNORMAL HIGH (ref 6.5–8.1)

## 2020-11-17 LAB — LIPASE, BLOOD: Lipase: 21 U/L (ref 11–51)

## 2020-11-17 LAB — CBC
HCT: 46.3 % (ref 39.0–52.0)
Hemoglobin: 16.6 g/dL (ref 13.0–17.0)
MCH: 30.3 pg (ref 26.0–34.0)
MCHC: 35.9 g/dL (ref 30.0–36.0)
MCV: 84.5 fL (ref 80.0–100.0)
Platelets: 354 10*3/uL (ref 150–400)
RBC: 5.48 MIL/uL (ref 4.22–5.81)
RDW: 13.4 % (ref 11.5–15.5)
WBC: 11.5 10*3/uL — ABNORMAL HIGH (ref 4.0–10.5)
nRBC: 0 % (ref 0.0–0.2)

## 2020-11-17 LAB — TROPONIN I (HIGH SENSITIVITY)
Troponin I (High Sensitivity): 5 ng/L (ref <18)
Troponin I (High Sensitivity): 4 ng/L (ref <18)

## 2020-11-17 MED ORDER — DROPERIDOL 2.5 MG/ML IJ SOLN
2.5000 mg | Freq: Once | INTRAMUSCULAR | Status: AC
  Administered 2020-11-17: 2.5 mg via INTRAVENOUS
  Filled 2020-11-17: qty 2

## 2020-11-17 MED ORDER — LIDOCAINE VISCOUS HCL 2 % MT SOLN
15.0000 mL | Freq: Once | OROMUCOSAL | Status: AC
  Administered 2020-11-17: 15 mL via ORAL
  Filled 2020-11-17: qty 15

## 2020-11-17 MED ORDER — PANTOPRAZOLE SODIUM 40 MG IV SOLR
40.0000 mg | Freq: Once | INTRAVENOUS | Status: AC
  Administered 2020-11-17: 40 mg via INTRAVENOUS
  Filled 2020-11-17: qty 40

## 2020-11-17 MED ORDER — OMEPRAZOLE 40 MG PO CPDR: 40.0000 mg | DELAYED_RELEASE_CAPSULE | Freq: Every day | ORAL | 0 refills | Status: DC

## 2020-11-17 MED ORDER — DIPHENHYDRAMINE HCL 50 MG/ML IJ SOLN
25.0000 mg | Freq: Once | INTRAMUSCULAR | Status: AC
  Administered 2020-11-17: 25 mg via INTRAVENOUS
  Filled 2020-11-17: qty 1

## 2020-11-17 MED ORDER — ALUM & MAG HYDROXIDE-SIMETH 200-200-20 MG/5ML PO SUSP
30.0000 mL | Freq: Once | ORAL | Status: AC
  Administered 2020-11-17: 30 mL via ORAL
  Filled 2020-11-17: qty 30

## 2020-11-17 MED ORDER — LACTATED RINGERS IV BOLUS
2000.0000 mL | Freq: Once | INTRAVENOUS | Status: AC
  Administered 2020-11-17: 2000 mL via INTRAVENOUS

## 2020-11-17 MED ORDER — SUCRALFATE 1 G PO TABS: 1.0000 g | ORAL_TABLET | Freq: Three times a day (TID) | ORAL | 0 refills | Status: DC

## 2020-11-17 MED ORDER — PROMETHAZINE HCL 25 MG PO TABS: 25.0000 mg | ORAL_TABLET | Freq: Four times a day (QID) | ORAL | 0 refills | Status: DC | PRN

## 2020-11-17 NOTE — Discharge Instructions (Signed)
STOP taking Goody's or any other NSAIDs (ibuprofen, alleve, naproxen)  Take the Omperazole as prescribed  Take the carafate w/ meals for the next week  Take Phenergan as needed for vomiting. If it makes you antsy, you can take benadryl 25 mg by mouth with it.

## 2020-11-17 NOTE — ED Provider Notes (Signed)
[facility] Emergency Department Provider Note  ____________________________________________   Event Date/Time   First MD Initiated Contact with Patient 11/17/20 [phone]     (approximate)  I have reviewed the triage vital signs and the nursing notes.   HISTORY  Chief Complaint Abdominal Pain, Emesis, and Weakness    HPI Albert Hill is a 45 y.o. male with history of hypertension, umbilical hernia, GERD, here with nausea and vomiting.  The patient states that  over the last week, he has had persistent worsening nausea, vomiting, and difficulty eating and drinking.  He states that he has had a rough month.  He was diagnosed with a gout flare several weeks ago and increase his allopurinol.  Last week, he and everyone in his family had nausea, vomiting, and diarrhea which she describes as a viral illness.  His symptoms did not improve, however, though everyone in his family has now better.  He states that he has had persistent nausea, vomiting, aching, gnawing, epigastric abdominal pain.  Patient states that anytime he eats he gets very nauseous.  He has had decreased appetite.  He is at night sweats.  Denies known history of ulcers.  He does take antacids and admits to taking Goody's, slightly more due to his gout flare.  Denies any melena.       Past Medical History:  Diagnosis Date  . GERD (gastroesophageal reflux disease)   . Gout   . Hypertension   . Umbilical hernia without obstruction and without gangrene     Patient Active Problem List   Diagnosis Date Noted  . Gout of ankle 04/11/2018  . Penile lesion 04/11/2018  . Essential hypertension 01/31/2018    Past Surgical History:  Procedure Laterality Date  . CYST EXCISION     Patient states he was 45 years old  . UMBILICAL HERNIA REPAIR N/A 07/26/2018   Procedure: OPEN HERNIA REPAIR UMBILICAL ADULT WITH MESH;  Surgeon: Vickie Epley, MD;  Location: [facility] ORS;  Service: General;  Laterality:  N/A;    Prior to Admission medications   Medication Sig Start Date End Date Taking? Authorizing Provider  omeprazole (PRILOSEC) 40 MG capsule Take 1 capsule (40 mg total) by mouth daily. 11/17/20 12/17/20 Yes Duffy Bruce, MD  promethazine (PHENERGAN) 25 MG tablet Take 1 tablet (25 mg total) by mouth every 6 (six) hours as needed for nausea or vomiting. 11/17/20  Yes Duffy Bruce, MD  sucralfate (CARAFATE) 1 g tablet Take 1 tablet (1 g total) by mouth 4 (four) times daily -  with meals and at bedtime for 7 days. 11/17/20 11/24/20 Yes Duffy Bruce, MD  acetaminophen (TYLENOL) 500 MG tablet Take 1,000 mg by mouth daily as needed for moderate pain.    [provider]  albuterol (PROVENTIL HFA;VENTOLIN HFA) 108 (90 Base) MCG/ACT inhaler Inhale 2 puffs into the lungs every 6 (six) hours as needed for wheezing or shortness of breath. 08/22/18   Doles-Johnson, Teah, NP  allopurinol (ZYLOPRIM) 100 MG tablet Take 1 tablet (100 mg total) by mouth daily. 04/03/19   Iloabachie, Chioma E, NP  amLODipine (NORVASC) 5 MG tablet Take 1 tablet (5 mg total) by mouth daily. 04/03/19   Iloabachie, Chioma E, NP  BREZTRI AEROSPHERE 160-9-4.8 MCG/ACT AERO Inhale 2 puffs into the lungs 2 (two) times daily. 11/19/19   [provider]  fluticasone (FLONASE) 50 MCG/ACT nasal spray Place 1 spray into both nostrils 2 (two) times daily. 09/05/18   Cuthriell, Charline Bills, PA-C  gabapentin (NEURONTIN) 300 MG capsule Take 300 mg by mouth 3 (three) times daily. 11/30/19   [provider]  lisinopril (ZESTRIL) 10 MG tablet Take 1 tablet (10 mg total) by mouth daily. 04/03/19   Iloabachie, Chioma E, NP  meloxicam (MOBIC) 7.5 MG tablet Take 7.5 mg by mouth 2 (two) times daily. 11/03/19   [provider]  Multiple Vitamin (MULTIVITAMIN WITH MINERALS) TABS tablet Take 1 tablet by mouth daily.    [provider]    Allergies Patient has no known allergies.  Family History  Problem Relation Age of  Onset  . Lung cancer Father     Social History Social History   Tobacco Use  . Smoking status: Current Every Day Smoker    Packs/day: 1.00    Years: 20.00    Pack years: 20.00    Types: Cigarettes  . Smokeless tobacco: Current User    Types: Chew  Vaping Use  . Vaping Use: Never used  Substance Use Topics  . Alcohol use: Not Currently    Comment: occasional  . Drug use: No    Review of Systems  Review of Systems  Constitutional: Positive for fatigue. Negative for chills and fever.  HENT: Negative for sore throat.   Respiratory: Negative for shortness of breath.   Cardiovascular: Negative for chest pain.  Gastrointestinal: Positive for abdominal pain, nausea and vomiting.  Genitourinary: Negative for flank pain.  Musculoskeletal: Negative for neck pain.  Skin: Negative for rash and wound.  Allergic/Immunologic: Negative for immunocompromised state.  Neurological: Positive for weakness. Negative for numbness.  Hematological: Does not bruise/bleed easily.  All other systems reviewed and are negative.    ____________________________________________  PHYSICAL EXAM:      VITAL SIGNS: ED Triage Vitals  Enc Vitals Group     BP 11/17/20 0727 (!) 135/102     Pulse Rate 11/17/20 0727 93     Resp 11/17/20 0727 17     Temp 11/17/20 0727 97.8 F (36.6 C)     Temp Source 11/17/20 0727 Oral     SpO2 11/17/20 0727 98 %     Weight 11/17/20 0728 234 lb (106.1 kg)     Height 11/17/20 0728 5\' 9"  (1.753 m)     Head Circumference --      Peak Flow --      Pain Score 11/17/20 0728 6     Pain Loc --      Pain Edu? --      Excl. in [place]? --      Physical Exam Vitals and nursing note reviewed.  Constitutional:      General: He is not in acute distress.    Appearance: He is well-developed.  HENT:     Head: Normocephalic and atraumatic.  Eyes:     Conjunctiva/sclera: Conjunctivae normal.  Cardiovascular:     Rate and Rhythm: Normal rate and regular rhythm.     Heart  sounds: Normal heart sounds. No murmur heard. No friction rub.  Pulmonary:     Effort: Pulmonary effort is normal. No respiratory distress.     Breath sounds: Normal breath sounds. No wheezing or rales.  Abdominal:     General: There is no distension.     Palpations: Abdomen is soft.     Tenderness: There is abdominal tenderness in the epigastric area. There is no guarding or rebound. Negative signs include Murphy's sign and Rovsing's sign.  Musculoskeletal:     Cervical back: Neck supple.  Skin:  General: Skin is warm.     Capillary Refill: Capillary refill takes less than 2 seconds.  Neurological:     Mental Status: He is alert and oriented to person, place, and time.     Motor: No abnormal muscle tone.       ____________________________________________   LABS (all labs ordered are listed, but only abnormal results are displayed)  Labs Reviewed  COMPREHENSIVE METABOLIC PANEL - Abnormal; Notable for the following components:      Result Value   Sodium 134 (*)    Chloride 94 (*)    Glucose, Bld 103 (*)    Total Protein 8.3 (*)    All other components within normal limits  CBC - Abnormal; Notable for the following components:   WBC 11.5 (*)    All other components within normal limits  LIPASE, BLOOD  URINALYSIS, COMPLETE (UACMP) WITH MICROSCOPIC  TROPONIN I (HIGH SENSITIVITY)  TROPONIN I (HIGH SENSITIVITY)    ____________________________________________  EKG: Normal sinus rhythm, ventricular 93.  PR 135, QRS 89, QTc 436.  No acute ST elevations or depressions.  KG evidence of acute ischemia or infarct. ________________________________________  RADIOLOGY All imaging, including plain films, CT scans, and ultrasounds, independently reviewed by me, and interpretations confirmed via formal radiology reads.  ED MD interpretation:     Official radiology report(s): No results found.  ____________________________________________  PROCEDURES   Procedure(s)  performed (including Critical Care):  Procedures  ____________________________________________  INITIAL IMPRESSION / MDM / [place] / ED COURSE  As part of my medical decision making, I reviewed the following data within the [place] notes reviewed and incorporated, Old chart reviewed, Notes from prior ED visits, and [place] Controlled Substance Database       *PETRA SARGEANT was evaluated in Emergency Department on 11/17/2020 for the symptoms described in the history of present illness. He was evaluated in the context of the global COVID-19 pandemic, which necessitated consideration that the patient might be at risk for infection with the SARS-CoV-2 virus that causes COVID-19. Institutional protocols and algorithms that pertain to the evaluation of patients at risk for COVID-19 are in a state of rapid change based on information released by regulatory bodies including the CDC and federal and state organizations. These policies and algorithms were followed during the patient's care in the ED.  Some ED evaluations and interventions may be delayed as a result of limited staffing during the pandemic.*     Medical Decision Making: 45 year old male here with nausea, vomiting, and epigastric abdominal pain.  This occurs in the setting of likely viral GI illness last week.  Suspect patient may have a component of acute gastritis in addition to his recent viral illness, and he has a history of GERD and has been taking Goody's for his gout.  Patient has no evidence to suggest active bleeding.  Hemoglobin is stable.  Is a mild nonspecific leukocytosis.  CMP unremarkable with normal LFTs.  Lipase normal.  EKG nonischemic and troponins negative, making ACS unlikely.  Patient feels markedly improved with symptomatic control in the ED.  His abdomen is otherwise soft and nontender with no focal tenderness to suggest cholecystitis, appendicitis, diverticulitis, or other  intra-abdominal emergency.  Will treat supportively and increase his antacid therapy at home.  We discussed stopping all NSAIDs and sticking with Tylenol as needed.  Will give Phenergan as he has had minimal relief with Zofran at home.  Return precautions given.  ____________________________________________  FINAL CLINICAL IMPRESSION(S) /  ED DIAGNOSES  Final diagnoses:  Acute superficial gastritis without hemorrhage  Non-intractable vomiting with nausea, unspecified vomiting type     MEDICATIONS GIVEN DURING THIS VISIT:  Medications  lactated ringers bolus 2,000 mL (0 mLs Intravenous Stopped 11/17/20 1138)  pantoprazole (PROTONIX) injection 40 mg (40 mg Intravenous Given 11/17/20 1002)  alum & mag hydroxide-simeth (MAALOX/MYLANTA) 200-200-20 MG/5ML suspension 30 mL (30 mLs Oral Given 11/17/20 1002)    And  lidocaine (XYLOCAINE) 2 % viscous mouth solution 15 mL (15 mLs Oral Given 11/17/20 1002)  droperidol (INAPSINE) 2.5 MG/ML injection 2.5 mg (2.5 mg Intravenous Given 11/17/20 1031)  diphenhydrAMINE (BENADRYL) injection 25 mg (25 mg Intravenous Given 11/17/20 1133)     ED Discharge Orders         Ordered    omeprazole (PRILOSEC) 40 MG capsule  Daily        11/17/20 1146    sucralfate (CARAFATE) 1 g tablet  3 times daily with meals & bedtime        11/17/20 1146    promethazine (PHENERGAN) 25 MG tablet  Every 6 hours PRN        11/17/20 1146           Note:  This document was prepared using Dragon voice recognition software and may include unintentional dictation errors.   Duffy Bruce, MD 11/17/20 1148

## 2020-11-17 NOTE — ED Triage Notes (Signed)
Pt comes into the ED via POV c/o abdominal pain, nausea, chills, and weakness.  Pt currently is ambulatory to triage with even and unlabored respirations.  Pt states this has been ongoing x 1 week and progressively gotten worse.  Pt already has Rx for zofran with little to no relief.  Pt states he took a COVID test that was negative last Thursday.

## 2020-11-17 NOTE — ED Notes (Signed)
PO challenge started. Pt denies N/V at this time. Will reassess.

## 2020-11-18 ENCOUNTER — Ambulatory Visit: Payer: Self-pay | Admitting: *Deleted

## 2020-11-18 NOTE — Telephone Encounter (Signed)
I was seen in the ED yesterday for abd pain, weakness, nausea and diarrhea.   I was diagnosed with gastritis.   I felt better when I left.     [facility] [place]  I went home and didn't get my medications because the pharmacy was closed. Last night I started  Feeling bad again.  The pharmacy opens at 9:00 this morning.  I'm going to have my rx switched to [facility] when they open.  My stomach is starting to bother me again.   Is there something I can do until the pharmacy opens at 9:00AM.    He drank some coffee this morning.   "That probably did not help"   I educated him on avoiding acid containing drinks like coffee and orange juice.   He verbalized understanding and will drink water instead.  See notes below.     Reason for Disposition . Abdominal pain    Seen in the ED yesterday diagnosed with gastritis.   Will pick up prescribed medications this morning and start them.    Will take a dose of Pepto Bismol for the stomach burning this morning until the pharmacy opens at 9:00.  Answer Assessment - Initial Assessment Questions 1. LOCATION: "Where does it hurt?"      He was seen in the ED yesterday at [facility] and diagnosed with gastritis from a viral infection he had last week.  He was given IV fluids and had blood work done.  He was prescribed medications which he has not picked up yet from the pharmacy because the pharmacy closed last night by the time he go out of the ED.   It doesn't open until 9:00 this morning.   He was prescribed Phenergan, Carafate and Omeprazole in the ED. It was called into the [facility] which was closed last night.   He [facility] so he won't have to drive to [facility] to pick them up this morning.  The [place] doesn't open until 9:00 so he was wanting to know if there was anything he could take to help with the burning in his stomach which has  started up again. 2. RADIATION: "Does the pain shoot anywhere else?" (e.g., chest, back)     No just in my upper stomach the burning has resumed.  3. ONSET: "When did the pain begin?" (e.g., minutes, hours or days ago)      During the night 4. SUDDEN: "Gradual or sudden onset?"     Not asked 5. PATTERN "Does the pain come and go, or is it constant?"    - If constant: "Is it getting better, staying the same, or worsening?"      (Note: Constant means the pain never goes away completely; most serious pain is constant and it progresses)     - If intermittent: "How long does it last?" "Do you have pain now?"     (Note: Intermittent means the pain goes away completely between bouts)     Constant  6. SEVERITY: "How bad is the pain?"  (e.g., Scale 1-10; mild, moderate, or severe)    - MILD (1-3): doesn't interfere with normal activities, abdomen soft and not tender to touch     - MODERATE (4-7): interferes with normal activities or awakens from sleep, abdomen tender to touch     - SEVERE (8-10): excruciating pain, doubled over, unable to do any normal activities  Moderate Is there anything I can take to help with the burning until I get my medications from the pharmacy this morning?   He has some Pepto Bismol and asked if it was ok to take that.   I let him know it was fine to take it that it would probably help with the burning in his stomach. 7. RECURRENT SYMPTOM: "Have you ever had this type of stomach pain before?" If Yes, ask: "When was the last time?" and "What happened that time?"      He has a history of GERD and takes omeprazole.   His whole family was sick with a GI bug last week but he hasn't gotten over it like the rest of the family did.   He is having the burning in his stomach with some nausea.   No diarrhea or vomiting this morning. 8. AGGRAVATING FACTORS: "Does anything seem to cause this pain?" (e.g., foods, stress, alcohol)     I'm wondering if it's my regular medications.  I've  not taken any of them this morning.   Should I take them?   I didn't take any of my medications yesterday. I instructed him to care his PCP for further directions regarding his medications   He was agreeable and will call his PCP. 9. CARDIAC SYMPTOMS: "Do you have any of the following symptoms: chest pain, difficulty breathing, sweating, nausea?"     Not asked since evaluated in the ED yesterday 10. OTHER SYMPTOMS: "Do you have any other symptoms?" (e.g., back pain, diarrhea, fever, urination pain, vomiting)       No just weak from being sick. 11. PREGNANCY: "Is there any chance you are pregnant?" "When was your last menstrual period?"       N/A  Protocols used: ABDOMINAL PAIN - UPPER-A-AH

## 2020-11-23 ENCOUNTER — Encounter: Payer: Self-pay | Admitting: Gastroenterology

## 2020-12-23 ENCOUNTER — Other Ambulatory Visit: Payer: Self-pay

## 2020-12-23 ENCOUNTER — Ambulatory Visit: Payer: BC Managed Care – PPO | Admitting: Gastroenterology

## 2020-12-23 ENCOUNTER — Ambulatory Visit (INDEPENDENT_AMBULATORY_CARE_PROVIDER_SITE_OTHER): Payer: BC Managed Care – PPO | Admitting: Gastroenterology

## 2020-12-23 ENCOUNTER — Encounter: Payer: Self-pay | Admitting: Gastroenterology

## 2020-12-23 VITALS — BP 112/80 | HR 101 | Ht 69.0 in | Wt 214.0 lb

## 2020-12-23 DIAGNOSIS — R1013 Epigastric pain: Secondary | ICD-10-CM | POA: Diagnosis not present

## 2020-12-23 DIAGNOSIS — R634 Abnormal weight loss: Secondary | ICD-10-CM

## 2020-12-23 DIAGNOSIS — Z1211 Encounter for screening for malignant neoplasm of colon: Secondary | ICD-10-CM | POA: Diagnosis not present

## 2020-12-23 DIAGNOSIS — R110 Nausea: Secondary | ICD-10-CM

## 2020-12-23 MED ORDER — NA SULFATE-K SULFATE-MG SULF 17.5-3.13-1.6 GM/177ML PO SOLN: 1.0000 | Freq: Once | ORAL | 0 refills | Status: AC

## 2020-12-23 MED ORDER — OMEPRAZOLE 40 MG PO CPDR: 40.0000 mg | DELAYED_RELEASE_CAPSULE | Freq: Two times a day (BID) | ORAL | 5 refills | Status: DC

## 2020-12-23 NOTE — Progress Notes (Addendum)
12/23/2020 Albert Hill [id number] 10/31/1975   HISTORY OF PRESENT ILLNESS: This is a 45 year old male who is a new patient to our office.  He is here today for follow-up after recent ER visit last month.  He tells me that he has had acid reflux issues for a while and has been on omeprazole 40 mg daily for the past couple of years, which seems to control the symptoms well.  He has struggled with nausea on and off though for about the past year.  He says that back in June he had some type of virus, negative COVID test and following that he was having a lot more nausea and upper stomach/abdominal discomfort.  He went to the ED.  No imaging was performed.  CBC was normal except a very slightly elevated white blood cell count.  Lipase is normal.  CMP showed just a very mildly low sodium and chloride levels.  He was given Phenergan and sucralfate.  He says he completed the sucralfate, but did not take it 4 times a day because it seemed like it was making his stomach hurt more.  Phenergan made him feel strange.  He had Zofran at home so he was using that instead.  He says that he has cut caffeine and a lot of sugar out of his diet to see if that would help with his symptoms.  Overall he is feeling better, but still does not feel 100%.  He is lost 30 pounds since about mid May.  Has never had a colonoscopy in the past.  Moves his bowels regularly.   Past Medical History:  Diagnosis Date   Asthma    COPD (chronic obstructive pulmonary disease) (HCC)    GERD (gastroesophageal reflux disease)    Gout    Hypertension    Umbilical hernia without obstruction and without gangrene    Past Surgical History:  Procedure Laterality Date   CYST EXCISION     Patient states he was 45 years old, on his back   UMBILICAL HERNIA REPAIR N/A 07/26/2018   Procedure: OPEN HERNIA REPAIR UMBILICAL ADULT WITH MESH;  Surgeon: Vickie Epley, MD;  Location: [facility] ORS;  Service: General;  Laterality: N/A;     reports that he has been smoking cigarettes. He has a 20.00 pack-year smoking history. He has been exposed to tobacco smoke. His smokeless tobacco use includes chew. He reports previous alcohol use. He reports current drug use. Drug: Marijuana. family history includes Lung cancer in his father. No Known Allergies    Outpatient Encounter Medications as of 12/23/2020  Medication Sig   acetaminophen (TYLENOL) 500 MG tablet Take 1,000 mg by mouth daily as needed for moderate pain.   albuterol (PROVENTIL HFA;VENTOLIN HFA) 108 (90 Base) MCG/ACT inhaler Inhale 2 puffs into the lungs every 6 (six) hours as needed for wheezing or shortness of breath.   allopurinol (ZYLOPRIM) 300 MG tablet Take 300 mg by mouth daily.   amLODipine (NORVASC) 10 MG tablet Take 10 mg by mouth daily.   atorvastatin (LIPITOR) 80 MG tablet Take 80 mg by mouth daily.   BREZTRI AEROSPHERE 160-9-4.8 MCG/ACT AERO Inhale 2 puffs into the lungs 2 (two) times daily.   cloNIDine (CATAPRES - DOSED IN MG/24 HR) 0.1 mg/24hr patch clonidine 0.1 mg/24 hr weekly transdermal patch   cyclobenzaprine (FLEXERIL) 10 MG tablet Take 10 mg by mouth 3 (three) times daily.   gabapentin (NEURONTIN) 300 MG capsule Take 300 mg by mouth 3 (three) times  daily.   lisinopril (ZESTRIL) 20 MG tablet Take 20 mg by mouth daily.   losartan (COZAAR) 100 MG tablet losartan 100 mg tablet   meclizine (ANTIVERT) 25 MG tablet Take 25 mg by mouth 3 (three) times daily as needed.   methocarbamol (ROBAXIN) 500 MG tablet methocarbamol 500 mg tablet  TAKE 1 TABLET BY MOUTH THREE TIMES A DAY AS NEEDED   Multiple Vitamin (MULTIVITAMIN WITH MINERALS) TABS tablet Take 1 tablet by mouth daily.   omeprazole (PRILOSEC) 40 MG capsule Take 1 capsule (40 mg total) by mouth daily.   ondansetron (ZOFRAN) 8 MG tablet Take 8 mg by mouth every 8 (eight) hours as needed.   [DISCONTINUED] Acetaminophen-Codeine 300-30 MG tablet Take 1 tablet by mouth every 6 (six) hours as needed.    [DISCONTINUED] allopurinol (ZYLOPRIM) 100 MG tablet Take 1 tablet (100 mg total) by mouth daily.   [DISCONTINUED] amLODipine (NORVASC) 5 MG tablet Take 1 tablet (5 mg total) by mouth daily.   [DISCONTINUED] Budeson-Glycopyrrol-Formoterol (BREZTRI AEROSPHERE) 160-9-4.8 MCG/ACT AERO Breztri Aerosphere 160 mcg-52mcg-4.8mcg/actuation HFA aerosol inhaler   [DISCONTINUED] fluticasone (FLONASE) 50 MCG/ACT nasal spray Place 1 spray into both nostrils 2 (two) times daily.   [DISCONTINUED] Fluticasone-Umeclidin-Vilant (TRELEGY ELLIPTA) 100-62.5-25 MCG/INH AEPB Trelegy Ellipta 100 mcg-62.5 mcg-25 mcg powder for inhalation   [DISCONTINUED] lisinopril (ZESTRIL) 10 MG tablet Take 1 tablet (10 mg total) by mouth daily.   [DISCONTINUED] meloxicam (MOBIC) 7.5 MG tablet Take 7.5 mg by mouth 2 (two) times daily.   [DISCONTINUED] pantoprazole (PROTONIX) 40 MG tablet pantoprazole 40 mg tablet,delayed release   [DISCONTINUED] promethazine (PHENERGAN) 25 MG tablet Take 1 tablet (25 mg total) by mouth every 6 (six) hours as needed for nausea or vomiting.   [DISCONTINUED] sucralfate (CARAFATE) 1 g tablet Take 1 tablet (1 g total) by mouth 4 (four) times daily -  with meals and at bedtime for 7 days.   No facility-administered encounter medications on file as of 12/23/2020.     REVIEW OF SYSTEMS  : All other systems reviewed and negative except where noted in the History of Present Illness.   PHYSICAL EXAM: BP 112/80   Pulse (!) 101   Ht 5\' 9"  (1.753 m)   Wt 214 lb (97.1 kg)   BMI 31.60 kg/m  General: Well developed white male in no acute distress Head: Normocephalic and atraumatic Eyes:  Sclerae anicteric, conjunctiva pink. Ears: Normal auditory acuity Lungs: Slight inspiratory wheezing noted B/L. Heart: Regular rate and rhythm; no M/R/G. Abdomen: Soft, non-distended.  BS present.  Mild diffuse TTP but > in the epigastrium. Rectal:  Will be done at the time of colonoscopy. Musculoskeletal: Symmetrical with no  gross deformities  Skin: No lesions on visible extremities Extremities: No edema  Neurological: Alert oriented x 4, grossly non-focal Psychological:  Alert and cooperative. Normal mood and affect  ASSESSMENT AND PLAN: *45 year old male with complaints of epigastric abdominal pain and nausea for about the past year, but worsening over the past month or so.  Has lost about 30 pounds since May while cutting some certain things out of his diet to see if it help with his symptoms.  He is on omeprazole 40 mg daily and has been for couple of years. Feels that that does help with his heartburn/reflux.  Will plan for EGD with Dr. Henrene Pastor.  We will increase his omeprazole to 40 mg twice daily.  New prescription sent to pharmacy. *CRC screening:  Will plan for colonoscopy with Dr. Henrene Pastor as well.  **The risks,  benefits, and alternatives to EGD and colonoscopy were discussed with the patient and he consents to proceed.    CC:  [place]

## 2020-12-23 NOTE — Progress Notes (Signed)
Assessment and plans reviewed  

## 2020-12-23 NOTE — Patient Instructions (Signed)
We have sent the following medications to your pharmacy for you to pick up at your convenience: Omeprazole 40 mg twice daily 30-60 minutes before breakfast and dinner.  If you are age 47 or older, your body mass index should be between 23-30. Your Body mass index is 31.6 kg/m. If this is out of the aforementioned range listed, please consider follow up with your Primary Care Provider.  If you are age 85 or younger, your body mass index should be between 19-25. Your Body mass index is 31.6 kg/m. If this is out of the aformentioned range listed, please consider follow up with your Primary Care Provider.   __________________________________________________________  The [facility] providers would like to encourage you to use [facility] to communicate with providers for non-urgent requests or questions.  Due to long hold times on the telephone, sending your provider a message by [facility] may be a faster and more efficient way to get a response.  Please allow 48 business hours for a response.  Please remember that this is for non-urgent requests.

## 2021-01-04 ENCOUNTER — Telehealth: Payer: Self-pay | Admitting: Gastroenterology

## 2021-01-04 NOTE — Telephone Encounter (Signed)
Called re: nausea and vomiting with Suprep purgative this evening in preparation for EGD/Colon tomorrow. Symptoms may have started to develop this afternoon prior to starting Suprep. No other systemic symptoms.   (1) He will start Miralax/Gatorade prep for a total of 238 g of Miralax and 64 ounces of Gatorade in split dosing. He will follow each dose with 2 8 ounce glasses of water. Reviewed doses and instructions over the phone  (2) He has Zofran at home for recent symptoms. He will take a SL dose prior to starting her dose of bowel prep.   He will contact the office at 8am if he has progression in his symptoms worrisome for possible infection and/or he is unable to complete the bowel prep.  I made myself available for any questions or concerns the rest of the evening.

## 2021-01-05 ENCOUNTER — Encounter: Payer: BC Managed Care – PPO | Admitting: Internal Medicine

## 2021-01-05 NOTE — Telephone Encounter (Signed)
Inbound call from patient. States he was still vomiting until 11:30pm/12am after taking Zofran. He did not go pick up the Miralax. Also, have not had a bowel movement. He is not making procedure for this morning 7/20. Rescheduled for 8/16. Is asking for a different solution than suprep. Best contact number [phone]

## 2021-01-05 NOTE — Telephone Encounter (Signed)
Spoke with pt and he is aware. Pt scheduled for previsit 01/18/21 at 8:30am. Left message for pt regarding the appt and asked that he call back to confirm if this date will work for him.

## 2021-01-05 NOTE — Telephone Encounter (Signed)
Left detailed message on pts voicemail regarding his previsit.

## 2021-01-18 ENCOUNTER — Other Ambulatory Visit: Payer: Self-pay

## 2021-01-18 ENCOUNTER — Telehealth: Payer: Self-pay | Admitting: Gastroenterology

## 2021-01-18 ENCOUNTER — Ambulatory Visit (AMBULATORY_SURGERY_CENTER): Payer: BC Managed Care – PPO | Admitting: *Deleted

## 2021-01-18 VITALS — Ht 69.0 in | Wt 214.0 lb

## 2021-01-18 DIAGNOSIS — R1013 Epigastric pain: Secondary | ICD-10-CM

## 2021-01-18 DIAGNOSIS — Z1211 Encounter for screening for malignant neoplasm of colon: Secondary | ICD-10-CM

## 2021-01-18 DIAGNOSIS — R110 Nausea: Secondary | ICD-10-CM

## 2021-01-18 DIAGNOSIS — R634 Abnormal weight loss: Secondary | ICD-10-CM

## 2021-01-18 NOTE — Telephone Encounter (Signed)
Called and spoke with patient, he advised that his insurance is not covering the Omeprazole at 40 mg since Omeprazole is and OTC. I reviewed previous medications and asked about the Pantoprazole. He stated that it did not work for him. He has taken some OTC Omeprazole with he states did not work for him. I reviewed the [place] and it looks like he could try generic Aciphex which would be a tier 2 and Dexilant is a tier 4 that would require a prior authorization. Would you like to switch him?

## 2021-01-18 NOTE — Progress Notes (Signed)
Patient's pre-visit was done today over the phone with the patient due to COVID-19 pandemic. Name,DOB and address verified. Insurance verified. Patient denies any allergies to Eggs and Soy. Patient denies any problems with anesthesia/sedation. Patient denies taking diet pills or blood thinners. No home Oxygen. Packet of Prep instructions mailed to patient including a copy of a consent form-pt is aware. Patient understands to call us back with any questions or concerns. Patient is aware of our care-partner policy and 0000000 safety protocol. No changes in medical chart hx per pt since last GI OV.  The patient is COVID-19 vaccinated.

## 2021-01-19 MED ORDER — RABEPRAZOLE SODIUM 20 MG PO TBEC: 20.0000 mg | DELAYED_RELEASE_TABLET | Freq: Every day | ORAL | 3 refills | Status: DC

## 2021-01-19 NOTE — Telephone Encounter (Signed)
Aciphex has been sent to patient's pharmacy

## 2021-01-19 NOTE — Telephone Encounter (Signed)
Zehr, Laban Emperor, PA-C  You 11 minutes ago (1:08 PM)     Let's try aciphex 20 mg daily.

## 2021-01-19 NOTE — Telephone Encounter (Signed)
Called and spoke with patient to advise that a new medication has been sent in. He let me know that he had already received a text from his pharmacy stating that it would need prior approval.

## 2021-01-20 NOTE — Telephone Encounter (Signed)
Prior Authorization has been submitted and approved:  Albert Hill  [employer]rie - Rx #: [id number] Outcome Approved today Effective from 01/20/2021 through 01/19/2022. 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

## 2021-02-01 ENCOUNTER — Encounter: Payer: Self-pay | Admitting: Internal Medicine

## 2021-02-01 ENCOUNTER — Ambulatory Visit (AMBULATORY_SURGERY_CENTER): Payer: BC Managed Care – PPO | Admitting: Internal Medicine

## 2021-02-01 VITALS — BP 84/45 | HR 75 | Temp 98.9°F | Resp 13 | Ht 69.0 in | Wt 214.0 lb

## 2021-02-01 DIAGNOSIS — K449 Diaphragmatic hernia without obstruction or gangrene: Secondary | ICD-10-CM | POA: Diagnosis not present

## 2021-02-01 DIAGNOSIS — R109 Unspecified abdominal pain: Secondary | ICD-10-CM

## 2021-02-01 DIAGNOSIS — K5730 Diverticulosis of large intestine without perforation or abscess without bleeding: Secondary | ICD-10-CM

## 2021-02-01 DIAGNOSIS — R110 Nausea: Secondary | ICD-10-CM

## 2021-02-01 DIAGNOSIS — Z1211 Encounter for screening for malignant neoplasm of colon: Secondary | ICD-10-CM

## 2021-02-01 DIAGNOSIS — K635 Polyp of colon: Secondary | ICD-10-CM

## 2021-02-01 DIAGNOSIS — K219 Gastro-esophageal reflux disease without esophagitis: Secondary | ICD-10-CM | POA: Diagnosis not present

## 2021-02-01 DIAGNOSIS — D125 Benign neoplasm of sigmoid colon: Secondary | ICD-10-CM

## 2021-02-01 MED ORDER — SODIUM CHLORIDE 0.9 % IV SOLN
500.0000 mL | Freq: Once | INTRAVENOUS | Status: DC
Start: 2021-02-01 — End: 2021-02-01

## 2021-02-01 NOTE — Progress Notes (Signed)
Pt's states no medical or surgical changes since previsit or office visit.   VS taken by CW 

## 2021-02-01 NOTE — Patient Instructions (Signed)
2 polyps removed- await pathology; please read handout about polyps and diverticulosis  Dr. Blanch Media office will call you to set up CT scan  Continue your normal medications  YOU HAD AN ENDOSCOPIC PROCEDURE TODAY AT [place]:   Refer to the procedure report that was given to you for any specific questions about what was found during the examination.  If the procedure report does not answer your questions, please call your gastroenterologist to clarify.  If you requested that your care partner not be given the details of your procedure findings, then the procedure report has been included in a sealed envelope for you to review at your convenience later.  YOU SHOULD EXPECT: Some feelings of bloating in the abdomen. Passage of more gas than usual.  Walking can help get rid of the air that was put into your GI tract during the procedure and reduce the bloating. If you had a lower endoscopy (such as a colonoscopy or flexible sigmoidoscopy) you may notice spotting of blood in your stool or on the toilet paper. If you underwent a bowel prep for your procedure, you may not have a normal bowel movement for a few days.  Please Note:  You might notice some irritation and congestion in your nose or some drainage.  This is from the oxygen used during your procedure.  There is no need for concern and it should clear up in a day or so.  SYMPTOMS TO REPORT IMMEDIATELY:  Following lower endoscopy (colonoscopy or flexible sigmoidoscopy):  Excessive amounts of blood in the stool  Significant tenderness or worsening of abdominal pains  Swelling of the abdomen that is new, acute  Fever of 100F or higher  Following upper endoscopy (EGD)  Vomiting of blood or coffee ground material  New chest pain or pain under the shoulder blades  Painful or persistently difficult swallowing  New shortness of breath  Fever of 100F or higher  Black, tarry-looking stools  For urgent or emergent issues, a  gastroenterologist can be reached at any hour by calling [phone]. Do not use MyChart messaging for urgent concerns.    DIET:  We do recommend a small meal at first, but then you may proceed to your regular diet.  Drink plenty of fluids but you should avoid alcoholic beverages for 24 hours.  ACTIVITY:  You should plan to take it easy for the rest of today and you should NOT DRIVE or use heavy machinery until tomorrow (because of the sedation medicines used during the test).    FOLLOW UP: Our staff will call the number listed on your records 48-72 hours following your procedure to check on you and address any questions or concerns that you may have regarding the information given to you following your procedure. If we do not reach you, we will leave a message.  We will attempt to reach you two times.  During this call, we will ask if you have developed any symptoms of COVID 19. If you develop any symptoms (ie: fever, flu-like symptoms, shortness of breath, cough etc.) before then, please call [phone].  If you test positive for Covid 19 in the 2 weeks post procedure, please call and report this information to [place].    If any biopsies were taken you will be contacted by phone or by letter within the next 1-3 weeks.  Please call us at [phone] if you have not heard about the biopsies in 3 weeks.    SIGNATURES/CONFIDENTIALITY: You and/or  your care partner have signed paperwork which will be entered into your electronic medical record.  These signatures attest to the fact that that the information above on your After Visit Summary has been reviewed and is understood.  Full responsibility of the confidentiality of this discharge information lies with you and/or your care-partner.

## 2021-02-01 NOTE — Op Note (Signed)
[place] Patient Name: Albert Hill Procedure Date: 02/01/2021 1:29 PM MRN: [id number] Endoscopist: Docia Chuck. Henrene Pastor , MD Age: 45 Referring MD:  Date of Birth: 03/05/1976 Gender: Male Account #: [id number] Procedure:                Colonoscopy with cold snare polypectomy x 1; with                            biopsies Indications:              Screening for colorectal malignant neoplasm Medicines:                Monitored Anesthesia Care Procedure:                Pre-Anesthesia Assessment:                           - Prior to the procedure, a History and Physical                            was performed, and patient medications and                            allergies were reviewed. The patient's tolerance of                            previous anesthesia was also reviewed. The risks                            and benefits of the procedure and the sedation                            options and risks were discussed with the patient.                            All questions were answered, and informed consent                            was obtained. Prior Anticoagulants: The patient has                            taken no previous anticoagulant or antiplatelet                            agents. ASA Grade Assessment: II - A patient with                            mild systemic disease. After reviewing the risks                            and benefits, the patient was deemed in                            satisfactory condition to undergo the procedure.  After obtaining informed consent, the colonoscope                            was passed under direct vision. Throughout the                            procedure, the patient's blood pressure, pulse, and                            oxygen saturations were monitored continuously. The                            CF [id number] was introduced through the anus                            and advanced to  the the cecum, identified by                            appendiceal orifice and ileocecal valve. The                            ileocecal valve, appendiceal orifice, and rectum                            were photographed. The quality of the bowel                            preparation was excellent. The colonoscopy was                            performed without difficulty. The patient tolerated                            the procedure well. The bowel preparation used was                            SUPREP via split dose instruction. Scope In: 1:39:12 PM Scope Out: 1:57:15 PM Scope Withdrawal Time: 0 hours 14 minutes 31 seconds  Total Procedure Duration: 0 hours 18 minutes 3 seconds  Findings:                 A 5 mm polyp was found in the sigmoid colon. The                            polyp was removed with a cold snare. Resection and                            retrieval were complete.                           A 1 mm polyp was found in the sigmoid colon. The                            polyp was biopsy  removed with a cold forceps and                            submitted for histology.                           Scattered medium-mouthed diverticula were found in                            the entire colon.                           The exam was otherwise without abnormality on                            direct and retroflexion views. Complications:            No immediate complications. Estimated blood loss:                            None. Estimated Blood Loss:     Estimated blood loss: none. Impression:               - One 5 mm polyp in the sigmoid colon, removed with                            a cold snare. Resected and retrieved.                           - One 1 mm polyp in the sigmoid colon. Biopsied                            removed.                           - Diverticulosis in the entire examined colon.                           - The examination was otherwise normal on direct                             and retroflexion views. Recommendation:           - Repeat colonoscopy in 7-10 years for surveillance.                           - Patient has a contact number available for                            emergencies. The signs and symptoms of potential                            delayed complications were discussed with the                            patient. Return to normal activities tomorrow.  Written discharge instructions were provided to the                            patient.                           - Resume previous diet.                           - Continue present medications.                           - Await pathology results. Docia Chuck. Henrene Pastor, MD 02/01/2021 2:03:17 PM This report has been signed electronically.

## 2021-02-01 NOTE — Progress Notes (Signed)
Report to PACU, RN, vss, BBS= Clear.  

## 2021-02-01 NOTE — Op Note (Signed)
[name]: Albert Hill Procedure Date: 02/01/2021 1:29 PM MRN: [id number] Endoscopist: Docia Chuck. Albert Hill , MD Age: 45 Referring MD:  Date of Birth: [birth year] Gender: Male Account #: [id number] Procedure:                Upper GI endoscopy Indications:              Abdominal pain, Esophageal reflux, Nausea, Weight                            loss Medicines:                Monitored Anesthesia Care Procedure:                Pre-Anesthesia Assessment:                           - Prior to the procedure, a History and Physical                            was performed, and patient medications and                            allergies were reviewed. The patient's tolerance of                            previous anesthesia was also reviewed. The risks                            and benefits of the procedure and the sedation                            options and risks were discussed with the patient.                            All questions were answered, and informed consent                            was obtained. Prior Anticoagulants: The patient has                            taken no previous anticoagulant or antiplatelet                            agents. ASA Grade Assessment: II - A patient with                            mild systemic disease. After reviewing the risks                            and benefits, the patient was deemed in                            satisfactory condition to undergo the procedure.  After obtaining informed consent, the endoscope was                            passed under direct vision. Throughout the                            procedure, the patient's blood pressure, pulse, and                            oxygen saturations were monitored continuously. The                            Endoscope was introduced through the mouth, and                            advanced to the second part of duodenum. The  upper                            GI endoscopy was accomplished without difficulty.                            The patient tolerated the procedure well. Scope In: Scope Out: Findings:                 The esophagus was normal.                           The stomach was normal save small hiatal hernia.                           The examined duodenum was normal.                           The cardia and gastric fundus were normal on                            retroflexion. Complications:            No immediate complications. Estimated Blood Loss:     Estimated blood loss: none. Impression:               1. GERD                           2. Normal EGD. Recommendation:           1. Patient has a contact number available for                            emergencies. The signs and symptoms of potential                            delayed complications were discussed with the                            patient. Return to normal activities tomorrow.  Written discharge instructions were provided to the                            patient.                           2. Resume previous diet.                           3. Continue present medications.                           4. Schedule contrast-enhanced CT scan of the                            abdomen and pelvis. "Abdominal pain, weight loss,                            nausea. Evaluate". We will contact you after the                            results are available.                           5. Return to the care of your primary provider Docia Chuck. Albert Pastor, MD 02/01/2021 2:17:08 PM This report has been signed electronically.

## 2021-02-01 NOTE — Progress Notes (Signed)
Patient ID: Albert Hill, male   DOB: [birth year], 45 y.o.   MRN: [id number]  GI PREPROCEDURE NOTE  HISTORY OF PRESENT ILLNESS:  Albert Hill is a 45 y.o. male who was evaluated by the GI PA-C December 23, 2020 regarding abdominal pain, nausea, and weight loss.  She did dictation.  He presents now for colonoscopy for colon cancer screening and upper endoscopy to evaluate GI complaints.  He does have chronic GERD and is doing better on a higher dose of PPI  REVIEW OF SYSTEMS:  All non-GI ROS negative.  Past Medical History:  Diagnosis Date   Asthma    COPD (chronic obstructive pulmonary disease) (HCC)    GERD (gastroesophageal reflux disease)    Gout    Hypertension    Umbilical hernia without obstruction and without gangrene     Past Surgical History:  Procedure Laterality Date   CYST EXCISION     Patient states he was 45 years old, on his back   UMBILICAL HERNIA REPAIR N/A 07/26/2018   Procedure: OPEN HERNIA REPAIR UMBILICAL ADULT WITH MESH;  Surgeon: Vickie Epley, MD;  Location: [facility] ORS;  Service: General;  Laterality: N/A;    Social History Albert Hill  reports that he has been smoking cigarettes. He has a 20.00 pack-year smoking history. He has been exposed to tobacco smoke. His smokeless tobacco use includes chew. He reports that he does not currently use alcohol. He reports current drug use. Drug: Marijuana.  family history includes Lung cancer in his father.  No Known Allergies     PHYSICAL EXAMINATION:  Vital signs: BP (!) 79/56   Pulse 87   Temp 98.9 F (37.2 C)   Resp 17   Ht '5\' 9"'$  (1.753 m)   Wt 214 lb (97.1 kg)   SpO2 97%   BMI 31.60 kg/m  General: Well-developed, well-nourished, no acute distress HEENT: Sclerae are anicteric, conjunctiva pink. Oral mucosa intact Lungs: Clear Heart: Regular Abdomen: soft, nontender, nondistended, no obvious ascites, no peritoneal signs, normal bowel sounds. No organomegaly. Extremities: No  edema Psychiatric: alert and oriented x3. Cooperative     ASSESSMENT:  1.  Abdominal pain, nausea, weight loss. 2.  GERD. 3.  Colon cancer screening   PLAN:  Colonoscopy and upper endoscopy.The nature of the procedure, as well as the risks, benefits, and alternatives were carefully and thoroughly reviewed with the patient. Ample time for discussion and questions allowed. The patient understood, was satisfied, and agreed to proceed.  Docia Chuck. Geri Seminole., M.D. [facility] of Gastroenterology

## 2021-02-01 NOTE — Progress Notes (Signed)
Called to room to assist during endoscopic procedure.  Patient ID and intended procedure confirmed with present staff. Received instructions for my participation in the procedure from the performing physician.  

## 2021-02-01 NOTE — Progress Notes (Signed)
Pt restless in the RR once he wakes up.  Attempted to crawl over stretcher railing.  I told him to not try to get out of the stretcher  2 bottle of contrast and blank CT instructions given to pt

## 2021-02-02 ENCOUNTER — Telehealth: Payer: Self-pay

## 2021-02-02 ENCOUNTER — Other Ambulatory Visit: Payer: Self-pay

## 2021-02-02 DIAGNOSIS — R110 Nausea: Secondary | ICD-10-CM

## 2021-02-02 DIAGNOSIS — R634 Abnormal weight loss: Secondary | ICD-10-CM

## 2021-02-02 DIAGNOSIS — R1013 Epigastric pain: Secondary | ICD-10-CM

## 2021-02-02 NOTE — Telephone Encounter (Signed)
CT of abdomen and pelvis with contrast placed in Epic. Scheduling to call and notify pt of date and time of test. Pt made aware.

## 2021-02-03 ENCOUNTER — Telehealth: Payer: Self-pay

## 2021-02-03 NOTE — Telephone Encounter (Signed)
  Follow up Call-  Call back number 02/01/2021  Post procedure Call Back phone  # [phone]  Permission to leave phone message Yes  Some recent data might be hidden     Patient questions:  Do you have a fever, pain , or abdominal swelling? No. Pain Score  0 *  Have you tolerated food without any problems? Yes.    Have you been able to return to your normal activities? Yes.    Do you have any questions about your discharge instructions: Diet   No. Medications  No. Follow up visit  No.  Do you have questions or concerns about your Care? No.  Actions: * If pain score is 4 or above: No action needed, pain <4.  Have you developed a fever since your procedure? no  2.   Have you had an respiratory symptoms (SOB or cough) since your procedure? no  3.   Have you tested positive for COVID 19 since your procedure no  4.   Have you had any family members/close contacts diagnosed with the COVID 19 since your procedure?  no   If yes to any of these questions please route to Joylene John, RN and Joella Prince, RN

## 2021-02-09 ENCOUNTER — Encounter: Payer: Self-pay | Admitting: Internal Medicine

## 2021-02-10 ENCOUNTER — Other Ambulatory Visit: Payer: Self-pay

## 2021-02-10 DIAGNOSIS — R1013 Epigastric pain: Secondary | ICD-10-CM

## 2021-02-28 ENCOUNTER — Other Ambulatory Visit: Payer: Self-pay

## 2021-02-28 ENCOUNTER — Ambulatory Visit (HOSPITAL_COMMUNITY)
Admission: RE | Admit: 2021-02-28 | Discharge: 2021-02-28 | Disposition: A | Discharge status: Home / Self Care | Payer: BC Managed Care – PPO | Source: Ambulatory Visit | Attending: Internal Medicine | Admitting: Internal Medicine

## 2021-02-28 ENCOUNTER — Encounter (HOSPITAL_COMMUNITY): Payer: Self-pay

## 2021-02-28 DIAGNOSIS — R110 Nausea: Secondary | ICD-10-CM | POA: Insufficient documentation

## 2021-02-28 DIAGNOSIS — R1013 Epigastric pain: Secondary | ICD-10-CM | POA: Diagnosis present

## 2021-02-28 DIAGNOSIS — R634 Abnormal weight loss: Secondary | ICD-10-CM | POA: Diagnosis present

## 2021-02-28 LAB — POCT I-STAT CREATININE: Creatinine, Ser: 0.8 mg/dL (ref 0.61–1.24)

## 2021-02-28 MED ORDER — IOHEXOL 350 MG/ML SOLN
80.0000 mL | Freq: Once | INTRAVENOUS | Status: AC | PRN
  Administered 2021-02-28: 80 mL via INTRAVENOUS

## 2021-04-23 ENCOUNTER — Other Ambulatory Visit: Payer: Self-pay | Admitting: Gastroenterology

## 2021-06-21 ENCOUNTER — Telehealth: Payer: Self-pay | Admitting: Internal Medicine

## 2021-06-21 NOTE — Telephone Encounter (Signed)
Attempted to reach pt by phone. Received message that voice mailbox is full and cannot accept messages. Will try again.  Pt states he has been having issues with nausea and abdominal pain for several weeks. He has tried eliminating different items from his diet and has lost wt. He states he was sick all through the holidays and needs to be seen. Pt scheduled to see Dr. Henrene Pastor 06/27/21 at 8:40am. Pt aware of appt.

## 2021-06-21 NOTE — Telephone Encounter (Signed)
Patient called stating he called yesterday and spoke with someone who told him someone would call him back today; however, there is no documentation of a call or returned call in his chart.  He had a colon/egd done back in August and is still having severe nausea and the medicine still doesn't seem to be working for him.  It's to the point he feels like he's either going to throw up or pass out.  He needs advice on what he should do as it is starting to interfere with him being able to work.  Please call patient and advise.  Thank you.

## 2021-06-27 ENCOUNTER — Ambulatory Visit (INDEPENDENT_AMBULATORY_CARE_PROVIDER_SITE_OTHER): Payer: BC Managed Care – PPO | Admitting: Internal Medicine

## 2021-06-27 ENCOUNTER — Encounter: Payer: Self-pay | Admitting: Internal Medicine

## 2021-06-27 VITALS — BP 150/98 | HR 96 | Ht 69.0 in | Wt 181.4 lb

## 2021-06-27 DIAGNOSIS — K219 Gastro-esophageal reflux disease without esophagitis: Secondary | ICD-10-CM | POA: Diagnosis not present

## 2021-06-27 DIAGNOSIS — R112 Nausea with vomiting, unspecified: Secondary | ICD-10-CM

## 2021-06-27 DIAGNOSIS — R634 Abnormal weight loss: Secondary | ICD-10-CM | POA: Diagnosis not present

## 2021-06-27 MED ORDER — ONDANSETRON HCL 8 MG PO TABS: 8.0000 mg | ORAL_TABLET | Freq: Two times a day (BID) | ORAL | 6 refills | Status: DC | PRN

## 2021-06-27 MED ORDER — DEXLANSOPRAZOLE 60 MG PO CPDR: 60.0000 mg | DELAYED_RELEASE_CAPSULE | Freq: Every day | ORAL | 3 refills | Status: DC

## 2021-06-27 NOTE — Patient Instructions (Signed)
If you are age 48 or older, your body mass index should be between 23-30. Your Body mass index is 26.78 kg/m. If this is out of the aforementioned range listed, please consider follow up with your Primary Care Provider.  If you are age 43 or younger, your body mass index should be between 19-25. Your Body mass index is 26.78 kg/m. If this is out of the aformentioned range listed, please consider follow up with your Primary Care Provider.   ________________________________________________________  The [facility] providers would like to encourage you to use [facility] to communicate with providers for non-urgent requests or questions.  Due to long hold times on the telephone, sending your provider a message by [facility] may be a faster and more efficient way to get a response.  Please allow 48 business hours for a response.  Please remember that this is for non-urgent requests.  _______________________________________________________  We have sent the following medications to your pharmacy for you to pick up at your convenience:  Zofran, [place] have been scheduled for a gastric emptying scan at [facility] on 07/01/2021 at 7:30am. Please arrive 30 minutes prior to your appointment for registration. Please make certain not to have anything to eat or drink after midnight the night before your test. Hold all stomach medications (ex: Zofran, phenergan, Reglan) 48 hours prior to your test. If you need to reschedule your appointment, please contact radiology scheduling at [phone]. _____________________________________________________________________ A gastric-emptying study measures how long it takes for food to move through your stomach. There are several ways to measure stomach emptying. In the most common test, you eat food that contains a small amount of radioactive material. A scanner that detects the movement of the radioactive material is placed over your abdomen to monitor the rate at  which food leaves your stomach. This test normally takes about 4 hours to complete. _____________________________________________________________________

## 2021-06-27 NOTE — Progress Notes (Signed)
HISTORY OF PRESENT ILLNESS:  Albert Hill is a 46 y.o. male who was evaluated by the GI physician assistant December 23, 2020 regarding epigastric pain, nausea, and weight loss.  He was prescribed omeprazole.  Blood work the month prior did not reveal any significant abnormalities.  He underwent colonoscopy and upper endoscopy February 01, 2021.  Colonoscopy revealed diverticulosis and diminutive colon polyps 1 of which was an adenoma.  Follow-up in 7 years recommended.  Upper endoscopy was normal.  He was felt to have GERD.  His insurance company did not cover omeprazole.  He was placed on Aciphex 20 mg daily.  He subsequently underwent a CT scan of the abdomen and pelvis February 28, 2021.  No significant abnormalities.  He tells me that since that time he did well until about 2 weeks ago when he ran out of his PPI.  After that he had problems with nausea and vomiting.  He tells me that his problems seem to begin in May when he had some sort of viral-like illness.  He tells me now that he can have problems with significant nausea and vomiting.  His weight has been dropping over time.  Since his July visit he has lost an additional 30 pounds.  He feels like he is not eating well.  He has been taking his wife's omeprazole.  This helps some.  He smokes a pack cigarettes per day.  He uses marijuana fairly frequently.  He is a Librarian, academic at [employer]  REVIEW OF SYSTEMS:  All non-GI ROS negative unless otherwise stated in the HPI except for anxiety, fatigue, depression, back pain, headaches, night sweats, sleeping problems, shortness of breath  Past Medical History:  Diagnosis Date   Asthma    COPD (chronic obstructive pulmonary disease) (HCC)    GERD (gastroesophageal reflux disease)    Gout    Hypertension    Umbilical hernia without obstruction and without gangrene     Past Surgical History:  Procedure Laterality Date   CYST EXCISION     Patient states he was 46 years old, on his  back   [place] N/A 07/26/2018   Procedure: [place];  Surgeon: Vickie Epley, MD;  Location: [facility] ORS;  Service: General;  Laterality: N/A;    Social History [name]  reports that he has been smoking cigarettes. He has a 20.00 pack-year smoking history. He has been exposed to tobacco smoke. His smokeless tobacco use includes chew. He reports that he does not currently use alcohol. He reports current drug use. Drug: Marijuana.  family history includes Lung cancer in his father.  No Known Allergies     PHYSICAL EXAMINATION: Vital signs: BP (!) 150/98    Pulse 96    Ht 5\' 9"  (1.753 m)    Wt 181 lb 6 oz (82.3 kg)    SpO2 98%    BMI 26.78 kg/m   Constitutional: generally well-appearing, no acute distress Psychiatric: alert and oriented x3, cooperative Eyes: extraocular movements intact, anicteric, conjunctiva pink Mouth: oral pharynx moist, no lesions Neck: supple no lymphadenopathy Cardiovascular: heart regular rate and rhythm, no murmur Lungs: clear to auscultation bilaterally Abdomen: soft, nontender, nondistended, no obvious ascites, no peritoneal signs, normal bowel sounds, no organomegaly Rectal: Omitted Extremities: no clubbing, cyanosis, or lower extremity edema bilaterally Skin: no lesions on visible extremities Neuro: No focal deficits. No asterixis.     ASSESSMENT:  1.  Recurrent problems with nausea and  vomiting.  Possibly GERD related.  Rule out gastroparesis.  Rule out cannabis hyperemesis syndrome.  Rule out functional.  Extensive GI work-up to date without any specific abnormalities identified 2.  Chronic tobacco use 3.  Chronic cannabis use 4.  COPD 5.  Chronic GERD.  Recent EGD normal 6.  Diminutive adenomatous colon polyp and diverticulosis on recent colonoscopy.  Otherwise normal   PLAN:  1.  Reflux precautions 2.  Stop smoking tobacco and cannabis 3.  Prescribe Dexilant 60 mg daily 4.   Schedule solid-phase gastric emptying scan to rule out gastroparesis 5.  Prescribe Zofran 8 mg twice daily.  Take running for now.  Medication risks reviewed 6.  Office follow-up with myself in 6 weeks.  Contact the office in the interim for any questions or problems A total time of 35 minutes was spent preparing to see the patient, reviewing test, x-rays, and endoscopy reports as well as pathology.  Obtaining comprehensive interval history, performing medically appropriate physical examination, counseling the patient regarding the above listed issues, ordering medications and advanced radiology procedures, and documenting clinical information in the health record

## 2021-07-01 ENCOUNTER — Ambulatory Visit (HOSPITAL_COMMUNITY): Admission: RE | Admit: 2021-07-01 | Payer: BC Managed Care – PPO | Source: Ambulatory Visit

## 2021-07-01 ENCOUNTER — Other Ambulatory Visit: Payer: Self-pay | Admitting: Internal Medicine

## 2021-07-18 ENCOUNTER — Ambulatory Visit (INDEPENDENT_AMBULATORY_CARE_PROVIDER_SITE_OTHER): Payer: BC Managed Care – PPO | Admitting: Physician Assistant

## 2021-07-18 ENCOUNTER — Encounter: Payer: Self-pay | Admitting: Physician Assistant

## 2021-07-18 ENCOUNTER — Other Ambulatory Visit (INDEPENDENT_AMBULATORY_CARE_PROVIDER_SITE_OTHER): Payer: BC Managed Care – PPO

## 2021-07-18 ENCOUNTER — Telehealth: Payer: Self-pay | Admitting: Internal Medicine

## 2021-07-18 VITALS — BP 106/62 | HR 100 | Ht 69.0 in | Wt 179.0 lb

## 2021-07-18 DIAGNOSIS — R634 Abnormal weight loss: Secondary | ICD-10-CM

## 2021-07-18 DIAGNOSIS — R112 Nausea with vomiting, unspecified: Secondary | ICD-10-CM

## 2021-07-18 DIAGNOSIS — R1013 Epigastric pain: Secondary | ICD-10-CM

## 2021-07-18 LAB — C-REACTIVE PROTEIN: CRP: 2.1 mg/dL (ref 0.5–20.0)

## 2021-07-18 LAB — CBC WITH DIFFERENTIAL/PLATELET
Basophils Absolute: 0.1 10*3/uL (ref 0.0–0.1)
Basophils Relative: 0.5 % (ref 0.0–3.0)
Eosinophils Absolute: 0.4 10*3/uL (ref 0.0–0.7)
Eosinophils Relative: 4 % (ref 0.0–5.0)
HCT: 43.7 % (ref 39.0–52.0)
Hemoglobin: 14.6 g/dL (ref 13.0–17.0)
Lymphocytes Relative: 21.8 % (ref 12.0–46.0)
Lymphs Abs: 2.4 10*3/uL (ref 0.7–4.0)
MCHC: 33.5 g/dL (ref 30.0–36.0)
MCV: 91.1 fl (ref 78.0–100.0)
Monocytes Absolute: 0.6 10*3/uL (ref 0.1–1.0)
Monocytes Relative: 5.6 % (ref 3.0–12.0)
Neutro Abs: 7.4 10*3/uL (ref 1.4–7.7)
Neutrophils Relative %: 68.1 % (ref 43.0–77.0)
Platelets: 233 10*3/uL (ref 150.0–400.0)
RBC: 4.8 Mil/uL (ref 4.22–5.81)
RDW: 14.4 % (ref 11.5–15.5)
WBC: 10.9 10*3/uL — ABNORMAL HIGH (ref 4.0–10.5)

## 2021-07-18 LAB — COMPREHENSIVE METABOLIC PANEL
ALT: 7 U/L (ref 0–53)
AST: 9 U/L (ref 0–37)
Albumin: 4.5 g/dL (ref 3.5–5.2)
Alkaline Phosphatase: 101 U/L (ref 39–117)
BUN: 14 mg/dL (ref 6–23)
CO2: 29 mEq/L (ref 19–32)
Calcium: 9.3 mg/dL (ref 8.4–10.5)
Chloride: 99 mEq/L (ref 96–112)
Creatinine, Ser: 0.93 mg/dL (ref 0.40–1.50)
GFR: 98.03 mL/min (ref >60.00)
Glucose, Bld: 97 mg/dL (ref 70–99)
Potassium: 3.8 mEq/L (ref 3.5–5.1)
Sodium: 138 mEq/L (ref 135–145)
Total Bilirubin: 0.5 mg/dL (ref 0.2–1.2)
Total Protein: 7.3 g/dL (ref 6.0–8.3)

## 2021-07-18 LAB — LIPASE: Lipase: 10 U/L — ABNORMAL LOW (ref 11.0–59.0)

## 2021-07-18 LAB — TSH: TSH: 1.4 u[IU]/mL (ref 0.35–5.50)

## 2021-07-18 MED ORDER — OMEPRAZOLE 40 MG PO CPDR: 40.0000 mg | DELAYED_RELEASE_CAPSULE | Freq: Every day | ORAL | 6 refills | Status: DC

## 2021-07-18 MED ORDER — ONDANSETRON 8 MG PO TBDP: 8.0000 mg | ORAL_TABLET | Freq: Three times a day (TID) | ORAL | 4 refills | Status: DC | PRN

## 2021-07-18 NOTE — Telephone Encounter (Signed)
Patient called asking if Dr. Henrene Pastor consider his hernia while looking and\or reviewing his CT results from September.

## 2021-07-18 NOTE — Telephone Encounter (Signed)
Spoke with pt and let him know the CT scan mentions evidence of prior umbilical hernia repair but no issues. Pt aware.

## 2021-07-18 NOTE — Telephone Encounter (Signed)
Patient called and states he is still having severe nausea pretty much every day.  He's lost weight and is having to be out of work and is in jeopardy of losing his job and needs something to be done.  The last medication prescribed to him is not working.  Please call patient and discuss his options.

## 2021-07-18 NOTE — Patient Instructions (Signed)
If you are age 48 or younger, your body mass index should be between 19-25. Your Body mass index is 26.43 kg/m. If this is out of the aformentioned range listed, please consider follow up with your Primary Care Provider.  ________________________________________________________  The [place] [facility] providers would like to encourage you to use [facility] to communicate with providers for non-urgent requests or questions.  Due to long hold times on the telephone, sending your provider a message by [facility] may be a faster and more efficient way to get a response.  Please allow 48 business hours for a response.  Please remember that this is for non-urgent requests.  _______________________________________________________  [name] have been scheduled for an abdominal ultrasound at [facility] (1st floor of hospital) on 07/22/2021 at 8:00 am. Please arrive 15 minutes prior to your appointment for registration. Make certain not to have anything to eat or drink 6 hours prior to your appointment. Should you need to reschedule your appointment, please contact radiology at [phone]. This test typically takes about 30 minutes to perform.  You have been scheduled for a gastric emptying scan at [facility] on 07/26/2021 at 8:00 am. Please arrive at least 15 minutes prior to your appointment for registration. Please make certain not to have anything to eat or drink after midnight the night before your test. Hold all stomach medications (ex: Zofran, phenergan, Reglan) 48 hours prior to your test. If you need to reschedule your appointment, please contact radiology scheduling at [phone]. ____________________________________________________________ A gastric-emptying study measures how long it takes for food to move through your stomach. There are several ways to measure stomach emptying. In the most common test, you eat food that contains a small amount of radioactive material. A scanner that detects the  movement of the radioactive material is placed over your abdomen to monitor the rate at which food leaves your stomach. This test normally takes about 4 hours to complete. ____________________________________________________________  Your provider has requested that you go to the basement level for lab work before leaving today. Press "B" on the elevator. The lab is located at the first door on the left as you exit the elevator.  Call the office to advise of the fax number for the Omeprazole 40 mg  Restart Zofran 8 mg ODT  STOP using Marijuana  Follow Gastroparesis diet, step 3  Follow up pending the results of your Gastric Emptying Scan and Ultrasound.  Thank you for entrusting me with your care and choosing [facility].  Amy Esterwood, PA-C

## 2021-07-18 NOTE — Progress Notes (Signed)
Subjective:    Patient ID: Albert Hill, male    DOB: Apr 09, 1976, 46 y.o.   MRN: [id number]  HPI Albert "Nicki Reaper" is a 46 year old white male, established with Dr. Henrene Pastor.  He was last seen on 06/27/2021 with persistent complaints of nausea and vomiting, possibly GERD related versus gastroparesis, versus possible cannabis hyperemesis syndrome versus functional. He was scheduled for gastric emptying scan, continued on Zofran 8 mg twice daily and given a new prescription for Dexilant 60 mg daily as he was not having success with Aciphex. Patient says he was unable to get the [place] because his insurance would not cover.  He is currently using his girlfriends omeprazole 40 mg twice daily because omeprazole has generally worked better for him than any begun other PPIs.  He apparently called the manufacture and was given a fax number where he can get a prescription for 40 mg omeprazole cheaply. He did not go to the gastric emptying scan as he says he was feeling too sick that day to go. Weight is down 2 pounds since his last office visit. In review, he had umbilical hernia repair in February 2020.  He says he did well for several months after surgery however about 8 months after the surgery he had an episode of nausea and vomiting that lasted for a few days, and then began having intermittent symptoms which have been progressively worse ever since that time.  He feels that he is much worse since Christmas 2022.  He is not having any regular or ongoing abdominal pain but says he does get episodes of abdominal discomfort and gnawing pain intermittently.  Sometimes if the pain persist that will precipitate nausea and vomiting.  He says sometimes he wakes up early in the morning nauseated. He had a bad episode about a week ago which occurred about an hour after eating a heavier meal with spareribs, and then ice cream.  He had epigastric discomfort, prolonged nausea and dry heaves but never  vomited. He admits to being anxious about the ongoing symptoms, worries about his ability to keep his job etc. He has been using cannabis fairly regularly though not on an every day basis.  Review of Systems Pertinent positive and negative review of systems were noted in the above HPI section.  All other review of systems was otherwise negative.   Outpatient Encounter Medications as of 07/18/2021  Medication Sig   acetaminophen (TYLENOL) 500 MG tablet Take 1,000 mg by mouth daily as needed for moderate pain.   albuterol (PROVENTIL HFA;VENTOLIN HFA) 108 (90 Base) MCG/ACT inhaler Inhale 2 puffs into the lungs every 6 (six) hours as needed for wheezing or shortness of breath.   allopurinol (ZYLOPRIM) 300 MG tablet Take 300 mg by mouth daily.   gabapentin (NEURONTIN) 300 MG capsule Take 300 mg by mouth 3 (three) times daily.   lisinopril (ZESTRIL) 20 MG tablet Take 20 mg by mouth daily.   Multiple Vitamin (MULTIVITAMIN WITH MINERALS) TABS tablet Take 1 tablet by mouth daily.   ondansetron (ZOFRAN) 8 MG tablet Take 1 tablet (8 mg total) by mouth 2 (two) times daily as needed for nausea or vomiting.   RABEprazole (ACIPHEX) 20 MG tablet TAKE 1 TABLET BY MOUTH EVERY DAY   dexlansoprazole (DEXILANT) 60 MG capsule Take 1 capsule (60 mg total) by mouth daily. (Patient not taking: Reported on 07/18/2021)   ondansetron (ZOFRAN-ODT) 8 MG disintegrating tablet Take 1 tablet (8 mg total) by mouth every 8 (eight) hours as needed.   [  DISCONTINUED] ondansetron (ZOFRAN-ODT) 8 MG disintegrating tablet Take 8 mg by mouth every 8 (eight) hours as needed. (Patient not taking: Reported on 07/18/2021)   No facility-administered encounter medications on file as of 07/18/2021.   No Known Allergies Patient Active Problem List   Diagnosis Date Noted   Nausea 12/23/2020   Abdominal pain, epigastric 12/23/2020   Loss of weight 12/23/2020   Colon cancer screening 12/23/2020   Gout of ankle 04/11/2018   Penile lesion  04/11/2018   Essential hypertension 01/31/2018   Social History   Socioeconomic History   Marital status: Divorced    Spouse name: Not on file   Number of children: 1   Years of education: Not on file   Highest education level: Not on file  Occupational History   Occupation: Charity fundraiser  Tobacco Use   Smoking status: Every Day    Packs/day: 1.00    Years: 20.00    Pack years: 20.00    Types: Cigarettes    Passive exposure: Current   Smokeless tobacco: Current    Types: Chew  Vaping Use   Vaping Use: Never used  Substance and Sexual Activity   Alcohol use: Not Currently    Comment: drank age [age], quit   Drug use: Yes    Types: Marijuana   Sexual activity: Not Currently  Other Topics Concern   Not on file  Social History Narrative   Not on file   Social Determinants of Health   Financial Resource Strain: Not on file  Food Insecurity: Not on file  Transportation Needs: Not on file  Physical Activity: Not on file  Stress: Not on file  Social Connections: Not on file  Intimate Partner Violence: Not on file    Albert Hill family history includes Lung cancer in his father.      Objective:    Vitals:   07/18/21 1320  BP: 106/62  Pulse: 100    Physical Exam. Well-developed well-nourished white male in no acute distress.  Height, Weight, 179 BMI 26.4  HEENT; nontraumatic normocephalic, EOMI, PE R LA, sclera anicteric. Oropharynx; not examined today Neck; supple, no JVD Cardiovascular; regular rate and rhythm with S1-S2, no murmur rub or gallop Pulmonary; Clear bilaterally Abdomen; soft, there is mild tenderness in the upper abdomen, no guarding or rebound nondistended,, sensitive to light touch at the umbilicus, no palpable mass or hepatosplenomegaly, bowel sounds are active Rectal; not done today Skin; benign exam, no jaundice rash or appreciable lesions Extremities; no clubbing cyanosis or edema skin warm and dry Neuro/Psych; alert and oriented x4,  grossly nonfocal mood and affect appropriate        Assessment & Plan:   #72 46 year old male with several month history of intermittent nausea and vomiting and mild upper abdominal discomfort.  He has had a 30 pound weight loss since May 2022. Etiology is not entirely clear. EGD August 2022 normal CT of the abdomen pelvis September 2022 negative  Rule out possible gastroparesis, rule out cannabis hyperemesis, rule out GERD related, rule out functional  #2 hypertension #3.  COPD #4.  Status postumbilical hernia repair February 2020 #5 colon cancer screening-up-to-date with colonoscopy August 2022, 1 tubular adenoma and diverticulosis noted  Plan; will start omeprazole 40 mg p.o. twice daily-patient to call back with fax number Refill Zofran 8 mg ODT 1 p.o. twice daily We will reschedule gastric emptying scan Schedule for upper abdominal ultrasound Start gastroparesis step 3 diet, also discussed avoiding heavy greasy meals Stop cannabis, this was  discussed with patient including rationale Labs today-CBC, c-Met, lipase, CRP, TSH Remote history of H. pylori, consider repeat testing if above negative     Jandel Patriarca S Albert Tester PA-C 07/18/2021   Cc: [place]

## 2021-07-18 NOTE — Telephone Encounter (Signed)
Left message for pt to call back.  Pt called back,. States he is still having issues with nausea and vomiting. He states he never got one of the meds that Dr. Henrene Pastor sent in and the zofran has not been helping. He was scheduled for a GES on the 13th but states he was "to sick to go." Pt scheduled to see Nicoletta Ba PA today at 1:30pm. He is aware of appt.

## 2021-07-18 NOTE — Telephone Encounter (Signed)
Patient called would like his medications sent to [facility] Fax: [phone].

## 2021-07-18 NOTE — Progress Notes (Signed)
Assessment and plan noted ?

## 2021-07-18 NOTE — Telephone Encounter (Signed)
Pharmacy added to patient's chart and Omeprazole has been sent in.

## 2021-07-22 ENCOUNTER — Other Ambulatory Visit: Payer: Self-pay

## 2021-07-22 ENCOUNTER — Other Ambulatory Visit: Payer: Self-pay | Admitting: Physician Assistant

## 2021-07-22 ENCOUNTER — Ambulatory Visit (HOSPITAL_COMMUNITY)
Admission: RE | Admit: 2021-07-22 | Discharge: 2021-07-22 | Disposition: A | Discharge status: Home / Self Care | Payer: BC Managed Care – PPO | Source: Ambulatory Visit | Attending: Physician Assistant | Admitting: Physician Assistant

## 2021-07-22 DIAGNOSIS — R1013 Epigastric pain: Secondary | ICD-10-CM | POA: Insufficient documentation

## 2021-07-22 DIAGNOSIS — R634 Abnormal weight loss: Secondary | ICD-10-CM

## 2021-07-22 DIAGNOSIS — R112 Nausea with vomiting, unspecified: Secondary | ICD-10-CM | POA: Diagnosis present

## 2021-07-22 DIAGNOSIS — J45909 Unspecified asthma, uncomplicated: Secondary | ICD-10-CM | POA: Insufficient documentation

## 2021-07-26 ENCOUNTER — Ambulatory Visit (HOSPITAL_COMMUNITY): Admission: RE | Admit: 2021-07-26 | Payer: BC Managed Care – PPO | Source: Ambulatory Visit

## 2021-08-10 ENCOUNTER — Encounter: Payer: Self-pay | Admitting: Internal Medicine

## 2021-08-10 ENCOUNTER — Other Ambulatory Visit: Payer: Self-pay

## 2021-08-10 ENCOUNTER — Ambulatory Visit (INDEPENDENT_AMBULATORY_CARE_PROVIDER_SITE_OTHER): Payer: BC Managed Care – PPO | Admitting: Internal Medicine

## 2021-08-10 ENCOUNTER — Other Ambulatory Visit (HOSPITAL_COMMUNITY): Payer: Self-pay

## 2021-08-10 VITALS — BP 120/90 | HR 95 | Temp 98.1°F | Ht 69.0 in | Wt 175.2 lb

## 2021-08-10 DIAGNOSIS — F17200 Nicotine dependence, unspecified, uncomplicated: Secondary | ICD-10-CM

## 2021-08-10 DIAGNOSIS — F17210 Nicotine dependence, cigarettes, uncomplicated: Secondary | ICD-10-CM | POA: Diagnosis not present

## 2021-08-10 DIAGNOSIS — J449 Chronic obstructive pulmonary disease, unspecified: Secondary | ICD-10-CM

## 2021-08-10 MED ORDER — STIOLTO RESPIMAT 2.5-2.5 MCG/ACT IN AERS: 2.0000 | INHALATION_SPRAY | Freq: Every day | RESPIRATORY_TRACT | 5 refills | Status: DC

## 2021-08-10 MED ORDER — PROAIR DIGIHALER 108 (90 BASE) MCG/ACT IN AEPB: 2.0000 | INHALATION_SPRAY | Freq: Four times a day (QID) | RESPIRATORY_TRACT | 5 refills | Status: DC | PRN

## 2021-08-10 NOTE — Addendum Note (Signed)
Addended by: Lenice Llamas on: 08/10/2021 01:52 PM   Modules accepted: Orders

## 2021-08-10 NOTE — Progress Notes (Signed)
The patient has been prescribed the inhaler stiolto, albuterol. Inhaler technique was demonstrated to patient. The patient subsequently demonstrated correct technique.

## 2021-08-10 NOTE — Patient Instructions (Addendum)
Please schedule follow up scheduled with myself in 1 months.  If my schedule is not open yet, we will contact you with a reminder closer to that time. Please call [phone] if you haven't heard from [place] a month before.   Before your next visit I would like you to have:  Full set of PFTs - 1 hour  Start using stiolto respimat inhaler 2 puffs in the morning.   Take the albuterol rescue inhaler every 4 to 6 hours as needed for wheezing or shortness of breath. You can also take it 15 minutes before exercise or exertional activity. Side effects include heart racing or pounding, jitters or anxiety. If you have a history of an irregular heart rhythm, it can make this worse. Can also give some patients a hard time sleeping.  Understanding COPD   What is COPD? COPD stands for chronic obstructive pulmonary (lung) disease. COPD is a general term used for several lung diseases.  COPD is an umbrella term and encompasses other  common diseases in this group like chronic bronchitis and emphysema. Chronic asthma may also be included in this group. While some patients with COPD have only chronic bronchitis or emphysema, most patients have a combination of both.  You might hear these terms used in exchange for one another.   COPD adds to the work of the heart. Diseased lungs may reduce the amount of oxygen that goes to the blood. High blood pressure in blood vessels from the heart to the lungs makes it difficult for the heart to pump. Lung disease can also cause the body to produce too many red blood cells which may make the blood thicker and harder to pump.   Patients who have COPD with low oxygen levels may develop an enlarged heart (cor pulmonale). This condition weakens the heart and causes increased shortness of breath and swelling in the legs and feet.   Chronic bronchitis Chronic bronchitis is irritation and inflammation (swelling) of the lining in the bronchial tubes (air passages). The irritation  causes coughing and an excess amount of mucus in the airways. The swelling makes it difficult to get air in and out of the lungs. The small, hair-like structures on the inside of the airways (called cilia) may be damaged by the irritation. The cilia are then unable to help clean mucus from the airways.  Bronchitis is generally considered to be chronic when you have: a productive cough (cough up mucus) and shortness of breath that lasts about 3 months or more each year for 2 or more years in a row. Your doctor may define chronic bronchitis differently.   Emphysema Emphysema is the destruction, or breakdown, of the walls of the alveoli (air sacs) located at the end of the bronchial tubes. The damaged alveoli are not able to exchange oxygen and carbon dioxide between the lungs and the blood. The bronchioles lose their elasticity and collapse when you exhale, trapping air in the lungs. The trapped air keeps fresh air and oxygen from entering the lungs.   Who is affected by COPD? Emphysema and chronic bronchitis affect approximately 16 million people in the [place], or close to 11 percent of the population.   Symptoms of COPD  Shortness of breath  Shortness of breath with mild exercise (walking, using the stairs, etc.)  Chronic, productive cough (with mucus)  A feeling of "tightness" in the chest  Wheezing   What causes COPD? The two primary causes of COPD are cigarette smoking and alpha1-antitrypsin (  AAT) deficiency. Air pollution and occupational dusts may also contribute to COPD, especially when the person exposed to these substances is a cigarette smoker.  Cigarette smoke causes COPD by irritating the airways and creating inflammation that narrows the airways, making it more difficult to breathe. Cigarette smoke also causes the cilia to stop working properly so mucus and trapped particles are not cleaned from the airways. As a result, chronic cough and excess mucus production develop, leading  to chronic bronchitis.  In some people, chronic bronchitis and infections can lead to destruction of the small airways, or emphysema.  AAT deficiency, an inherited disorder, can also lead to emphysema. Alpha antitrypsin (AAT) is a protective material produced in the liver and transported to the lungs to help combat inflammation. When there is not enough of the chemical AAT, the body is no longer protected from an enzyme in the white blood cells.   How is COPD diagnosed?  To diagnose COPD, the physician needs to know: Do you smoke?  Have you had chronic exposure to dust or air pollutants?  Do other members of your family have lung disease?  Are you short of breath?  Do you get short of breath with exercise?  Do you have chronic cough and/or wheezing?  Do you cough up excess mucus?  To help with the diagnosis, the physician will conduct a thorough physical exam which includes:  Listening to your lungs and heart  Checking your blood pressure and pulse  Examining your nose and throat  Checking your feet and ankles for swelling   Laboratory and other tests Several laboratory and other tests are needed to confirm a diagnosis of COPD. These tests may include:  Chest X-ray to look for lung changes that could be caused by COPD   Spirometry and pulmonary function tests (PFTs) to determine lung volume and air flow  Pulse oximetry to measure the saturation of oxygen in the blood  Arterial blood gases (ABGs) to determine the amount of oxygen and carbon dioxide in the blood  Exercise testing to determine if the oxygen level in the blood drops during exercise   Treatment In the beginning stages of COPD, there is minimal shortness of breath that may be noticed only during exercise. As the disease progresses, shortness of breath may worsen and you may need to wear an oxygen device.   To help control other symptoms of COPD, the following treatments and lifestyle changes may be prescribed.  Quitting  smoking  Avoiding cigarette smoke and other irritants  Taking medications including: a. bronchodilators b. anti-inflammatory agents c. oxygen d. antibiotics  Maintaining a healthy diet  Following a structured exercise program such as pulmonary rehabilitation Preventing respiratory infections  Controlling stress   If your COPD progresses, you may be eligible to be evaluated for lung volume reduction surgery or lung transplantation. You may also be eligible to participate in certain clinical trials (research studies). Ask your health care providers about studies being conducted in your hospital.   What is the outlook? Although COPD can not be cured, its symptoms can be treated and your quality of life can be improved. Your prognosis or outlook for the future will depend on how well your lungs are functioning, your symptoms, and how well you respond to and follow your treatment plan.   What are the benefits of quitting smoking? Quitting smoking can lower your chances of getting or dying from heart disease, lung disease, kidney failure, infection, or cancer. It can also lower your  chances of getting osteoporosis, a condition that makes your bones weak. Plus, quitting smoking can help your skin look younger and reduce the chances that you will have problems with sex.  Quitting smoking will improve your health no matter how old you are, and no matter how long or how much you have smoked.  What should I do if I want to quit smoking? The letters in the word "START" can help you remember the steps to take: S = Set a quit date. T = Tell family, friends, and the people around you that you plan to quit. A = Anticipate or plan ahead for the tough times you'll face while quitting. R = Remove cigarettes and other tobacco products from your home, car, and work. T = Talk to your doctor about getting help to quit.  How can my doctor or nurse help? Your doctor or nurse can give you advice on the best way  to quit. He or she can also put you in touch with counselors or other people you can call for support. Plus, your doctor or nurse can give you medicines to: ?Reduce your craving for cigarettes ?Reduce the unpleasant symptoms that happen when you stop smoking (called "withdrawal symptoms"). You can also get help from a free phone line (1-800-QUIT-NOW) or go online to [facility].  What are the symptoms of withdrawal? The symptoms include: ?Trouble sleeping ?Being irritable, anxious or restless ?Getting frustrated or angry ?Having trouble thinking clearly  Some people who stop smoking become temporarily depressed. Some people need treatment for depression, such as counseling or antidepressant medicines. Depressed people might: ?No longer enjoy or care about doing the things they used to like to do ?Feel sad, down, hopeless, nervous, or cranky most of the day, almost every day ?Lose or gain weight ?Sleep too much or too little ?Feel tired or like they have no energy ?Feel guilty or like they are worth nothing ?Forget things or feel confused ?Move and speak more slowly than usual ?Act restless or have trouble staying still ?Think about death or suicide  If you think you might be depressed, see your doctor or nurse. Only someone trained in mental health can tell for sure if you are depressed. If you ever feel like you might hurt yourself, go straight to the nearest emergency department. Or you can call for an ambulance (in the [place] and [place], [place] 9-1-1) or call your doctor or nurse right away and tell them it is an emergency. You can also reach the [place] National Suicide Prevention Lifeline at (220)614-4139 or http://walker-sanchez.info/.  How do medicines help you stop smoking? Different medicines work in different ways: ?Nicotine replacement therapy eases withdrawal and reduces your body's craving for nicotine, the main drug found in cigarettes. There are different forms of  nicotine replacement, including skin patches, lozenges, gum, nasal sprays, and "puffers" or inhalers. Many can be bought without a prescription, while others might require one. ?Bupropion is a prescription medicine that reduces your desire to smoke. This medicine is sold under the brand names Zyban and Wellbutrin. It is also available in a generic version, which is cheaper than brand name medicines. ?Varenicline (brand names: Chantix, Champix) is a prescription medicine that reduces withdrawal symptoms and cigarette cravings. If you think you'd like to take varenicline and you have a history of depression, anxiety, or heart disease, discuss this with your doctor or nurse before taking the medicine. Varenicline can also increase the effects of alcohol in some people. It's a good  idea to limit drinking while you're taking it, at least until you know how it affects you.  How does counseling work? Counseling can happen during formal office visits or just over the phone. A counselor can help you: ?Figure out what triggers your smoking and what to do instead ?Overcome cravings ?Figure out what went wrong when you tried to quit before  What works best? Studies show that people have the best luck at quitting if they take medicines to help them quit and work with a Social worker. It might also be helpful to combine nicotine replacement with one of the prescription medicines that help people quit. In some cases, it might even make sense to take bupropion and varenicline together.  What about e-cigarettes? Sometimes people wonder if using electronic cigarettes, or "e-cigarettes," might help them quit smoking. Using e-cigarettes is also called "vaping." Doctors do not recommend e-cigarettes in place of medicines and counseling. That's because e-cigarettes still contain nicotine as well as other substances that might be harmful. It's not clear how they can affect a person's health in the long term.  Will I gain  weight if I quit? Yes, you might gain a few pounds. But quitting smoking will have a much more positive effect on your health than weighing a few pounds more. Plus, you can help prevent some weight gain by being more active and eating less. Taking the medicine bupropion might help control weight gain.   What else can I do to improve my chances of quitting? You can: ?Start exercising. ?Stay away from smokers and places that you associate with smoking. If people close to you smoke, ask them to quit with you. ?Keep gum, hard candy, or something to put in your mouth handy. If you get a craving for a cigarette, try one of these instead. ?Don't give up, even if you start smoking again. It takes most people a few tries before they succeed.  What if I am pregnant and I smoke? If you are pregnant, it's really important for the health of your baby that you quit. Ask your doctor what options you have, and what is safest for your baby

## 2021-08-10 NOTE — Progress Notes (Addendum)
Albert Hill    [id number]    09/21/1975  Primary Care Physician:[facility]  Referring Physician: [address], [address] [place],  [place] [address] Reason for Consultation: shortness of breath, fatigue Date of Consultation: 08/10/2021  Chief complaint:   Chief Complaint  Patient presents with   Consult    Sob and fatigue for the past year off and on. Urgent, had cxr showed fluid on lungs or emphysema.  Current smoker.     HPI: Albert Hill is a 46 y.o. man with tobacco use disorder who presents for new patient evaluation of shortness of breath and fatigue.    Symptoms have been going on for over a year since he had covid. In the last year he has lost 65 lbs unintentionally, dealing with nausea. Is undergoing work up with GI. Doing better with this now that he is trying to stay more hydrated.   Has daily dyspnea with cough. Is prescribed albuterol inhaler but it costs him $94. Hasn't been able to pick up. Takes nebulizer treatments twice a day and this help his breathing.   Recent prednisone tapers given by pcp.  Former heavy alcohol use. Daily marijuana use. Continues to smoke 2 ppd and is interested in quitting.   Reports poor sleep.   Did have occasional asthma as a child which he grew out of.   Quit smoking cold [place] while he was in prison.   Social history:  Occupation: works at a Teacher, early years/pre.  Exposures: lives with girlfriend, dog.  Smoking history: daily marijuana use, through a bowl. Daily 2ppd 60 pack years.   Social History   Occupational History   Occupation: Charity fundraiser  Tobacco Use   Smoking status: Every Day    Packs/day: 2.00    Years: 30.00    Pack years: 60.00    Types: Cigarettes    Passive exposure: Current   Smokeless tobacco: Current    Types: Chew   Tobacco comments:    Smokes a ppd.  Wants to quit.  08/10/2021.  Vaping Use   Vaping Use: Never used  Substance and Sexual  Activity   Alcohol use: Not Currently    Comment: drank age [age], quit   Drug use: Yes    Types: Marijuana   Sexual activity: Not Currently    Relevant family history:  Family History  Problem Relation Age of Onset   Lung cancer Father    Colon cancer Neg Hx    Esophageal cancer Neg Hx    Rectal cancer Neg Hx    Stomach cancer Neg Hx     Past Medical History:  Diagnosis Date   Asthma    COPD (chronic obstructive pulmonary disease) (HCC)    GERD (gastroesophageal reflux disease)    Gout    Hypertension    Umbilical hernia without obstruction and without gangrene     Past Surgical History:  Procedure Laterality Date   CYST EXCISION     Patient states he was 46 years old, on his back   UMBILICAL HERNIA REPAIR N/A 07/26/2018   Procedure: OPEN HERNIA REPAIR UMBILICAL ADULT WITH MESH;  Surgeon: Vickie Epley, MD;  Location: [facility] ORS;  Service: General;  Laterality: N/A;     Physical Exam: Blood pressure 120/90, pulse 95, temperature 98.1 F (36.7 C), temperature source Oral, height 5\' 9"  (1.753 m), weight 175 lb 3.2 oz (79.5 kg), SpO2 96 %. Gen:  No acute distress, hoarse voice ENT:  no nasal polyps, mucus membranes moist Lungs:    diminished, +rhonchi and wheezes, no increased wob CV:         Regular rate and rhythm; no murmurs, rubs, or gallops.  No pedal edema Abd:      + bowel sounds; soft, non-tender; no distension MSK: no acute synovitis of DIP or PIP joints, no mechanics hands.  Skin:      Warm and dry; no rashes Neuro: normal speech, no focal facial asymmetry Psych: alert and oriented x3, normal mood and affect, anxious   Data Reviewed/Medical Decision Making:  Independent interpretation of tests: Imaging:  Review of patient's CT A/P lung windows images revealed emphysema. The patient's images have been independently reviewed by me.    PFTs:  No flowsheet data found.  Labs:  Lab Results  Component Value Date   WBC 10.9 (H) 07/18/2021   HGB  14.6 07/18/2021   HCT 43.7 07/18/2021   MCV 91.1 07/18/2021   PLT 233.0 07/18/2021   Lab Results  Component Value Date   NA 138 07/18/2021   K 3.8 07/18/2021   CL 99 07/18/2021   CO2 29 07/18/2021     Immunization status:  Immunization History  Administered Date(s) Administered   Influenza,inj,Quad PF,6+ Mos 03/24/2019   PFIZER(Purple Top)SARS-COV-2 Vaccination 10/10/2019, 11/03/2019     I reviewed prior external note(s) from GI, PCP  I reviewed the result(s) of the labs and imaging as noted above.   I have ordered PFT  Assessment:  COPD with emphysema Tobacco use disorder with smokeless tobacco Need for lung cancer screening Daily marijuana use, possible cannabinoid hyperemesis  Plan/Recommendations: Will obtain PFTs.  Needs lung cancer screening. Will refer.  Start albuterol rescue inhaler as needed.  Start LAMA-LABA Will do pharmacy BIV  Smoking Cessation Counseling:  1. The patient is an everyday smoker and symptomatic due to the following condition COPD 2. The patient is currently contemplative in quitting smoking. 3. I advised patient to quit smoking. 4. We identified patient specific barriers to change.  5. I personally spent 3 minutes counseling the patient regarding tobacco use disorder. 6. We discussed management of stress and anxiety to help with smoking cessation, when applicable. 7. We discussed nicotine replacement therapy, Wellbutrin, Chantix as possible options. 8. I advised setting a quit date. 9. Follow?up arranged with our office to continue ongoing discussions. 10.Resources given to patient including quit hotline.    We discussed disease management and progression at length today.    Return to Care: Return in about 4 weeks (around 09/07/2021).  Lenice Llamas, MD Pulmonary and [place]  CC: [place], [place]

## 2021-08-12 DIAGNOSIS — M5432 Sciatica, left side: Secondary | ICD-10-CM | POA: Insufficient documentation

## 2021-08-12 DIAGNOSIS — Y990 Civilian activity done for income or pay: Secondary | ICD-10-CM | POA: Insufficient documentation

## 2021-08-12 DIAGNOSIS — M545 Low back pain, unspecified: Secondary | ICD-10-CM | POA: Diagnosis present

## 2021-08-12 DIAGNOSIS — X501XXA Overexertion from prolonged static or awkward postures, initial encounter: Secondary | ICD-10-CM | POA: Diagnosis not present

## 2021-08-12 NOTE — ED Triage Notes (Signed)
Tuesday morning the pt states he picked something up at work and felt his back pop. He now complains of sciatic like pain on the left side. 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

## 2021-08-13 ENCOUNTER — Emergency Department: Payer: BC Managed Care – PPO

## 2021-08-13 ENCOUNTER — Emergency Department
Admission: EM | Admit: 2021-08-13 | Discharge: 2021-08-13 | Disposition: A | Discharge status: Home / Self Care | Payer: BC Managed Care – PPO | Attending: Emergency Medicine | Admitting: Emergency Medicine

## 2021-08-13 DIAGNOSIS — M5432 Sciatica, left side: Secondary | ICD-10-CM

## 2021-08-13 MED ORDER — OXYCODONE-ACETAMINOPHEN 5-325 MG PO TABS
1.0000 | ORAL_TABLET | Freq: Once | ORAL | Status: AC
  Administered 2021-08-13: 1 via ORAL
  Filled 2021-08-13: qty 1

## 2021-08-13 MED ORDER — PREDNISONE 20 MG PO TABS
60.0000 mg | ORAL_TABLET | Freq: Once | ORAL | Status: AC
  Administered 2021-08-13: 60 mg via ORAL
  Filled 2021-08-13: qty 3

## 2021-08-13 MED ORDER — OXYCODONE-ACETAMINOPHEN 5-325 MG PO TABS: 1.0000 | ORAL_TABLET | ORAL | 0 refills | Status: DC | PRN

## 2021-08-13 MED ORDER — IBUPROFEN 600 MG PO TABS: 600.0000 mg | ORAL_TABLET | Freq: Four times a day (QID) | ORAL | 0 refills | Status: DC | PRN

## 2021-08-13 MED ORDER — PREDNISONE 20 MG PO TABS: 40.0000 mg | ORAL_TABLET | Freq: Every day | ORAL | 0 refills | Status: DC

## 2021-08-13 MED ORDER — KETOROLAC TROMETHAMINE 30 MG/ML IJ SOLN
30.0000 mg | Freq: Once | INTRAMUSCULAR | Status: AC
  Administered 2021-08-13: 30 mg via INTRAMUSCULAR
  Filled 2021-08-13: qty 1

## 2021-08-13 NOTE — ED Provider Notes (Signed)
[facility] Provider Note    Event Date/Time   First MD Initiated Contact with Patient 08/13/21 0115     (approximate)   History   Back Pain   HPI  Albert Hill is a 46 y.o. male with a prior history of bulging disks who presents with left lower back pain, acute onset about 2 days ago when he was at work, turned to move something, and felt a pop in his lower back.  He reports pain radiating down the left leg.  He states it feels like prior sciatica.  He reports tingling in the left leg but denies any numbness or weakness.  He is able to walk with the pain.  He denies any urinary retention or incontinence.    Physical Exam   Triage Vital Signs: ED Triage Vitals  Enc Vitals Group     BP 08/12/21 2333 (!) 152/106     Pulse Rate 08/12/21 2333 100     Resp 08/12/21 2333 18     Temp 08/12/21 2333 98.1 F (36.7 C)     Temp Source 08/12/21 2333 Oral     SpO2 08/12/21 2333 98 %     Weight --      Height --      Head Circumference --      Peak Flow --      Pain Score 08/12/21 2334 10     Pain Loc --      Pain Edu? --      Excl. in [place]? --     Most recent vital signs: Vitals:   08/12/21 2333 08/13/21 0315  BP: (!) 152/106 (!) 152/112  Pulse: 100 74  Resp: 18 18  Temp: 98.1 F (36.7 C) 98.1 F (36.7 C)  SpO2: 98% 99%     General: Alert and oriented, uncomfortable appearing but in no acute distress. CV:  Good peripheral perfusion.  Resp:  Normal effort.  Abd:  No distention.  Other:  No midline spinal tenderness.  Left lumbosacral paraspinal tenderness.  5/5 motor strength and intact sensation of bilateral lower extremities.  Antalgic gait.   ED Results / Procedures / Treatments   Labs (all labs ordered are listed, but only abnormal results are displayed) Labs Reviewed - No data to display   EKG     RADIOLOGY  XR lumbar spine: I independently viewed and interpreted the images.  There is no evidence of acute fracture.   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  PROCEDURES:  Critical Care performed: No  Procedures   MEDICATIONS ORDERED IN ED: Medications  ketorolac (TORADOL) 30 MG/ML injection 30 mg (30 mg Intramuscular Given 08/13/21 0208)  predniSONE (DELTASONE) tablet 60 mg (60 mg Oral Given 08/13/21 0208)  oxyCODONE-acetaminophen (PERCOCET/ROXICET) 5-325 MG per tablet 1 tablet (1 tablet Oral Given 08/13/21 0208)     IMPRESSION / MDM / ASSESSMENT AND PLAN / ED COURSE  I reviewed the triage vital signs and the nursing notes.  46 year old male with a prior history of bulging disks in his lumbar spine presents with acute onset of left lower back pain radiating to the leg with some paresthesias in the leg but no numbness or weakness.  Exam confirms normal neurologic function.  Overall presentation is consistent with sciatica or other radiculopathy likely caused by bulging disc.  Given the acute onset of the pain during movement, I obtained lumbar spine x-rays to rule out acute compression fracture or other acute findings.  Given the  lack of any weakness or other neurologic deficits on exam there is no evidence of cauda equina syndrome or indication for emergent MRI.  We gave Toradol, prednisone, and oxycodone and after approximately an hour the patient had good relief of the pain and was able to walk without assistance.  At this time, he is stable for discharge home.  I counseled him on the results of the work-up.  I recommend that he follow-up with his primary care.  Return precautions given, and he expresses understanding.   FINAL CLINICAL IMPRESSION(S) / ED DIAGNOSES   Final diagnoses:  Sciatica of left side     Rx / DC Orders   ED Discharge Orders          Ordered    predniSONE (DELTASONE) 20 MG tablet  Daily        08/13/21 0301    ibuprofen (ADVIL) 600 MG tablet  Every 6 hours PRN        08/13/21 0301    oxyCODONE-acetaminophen (PERCOCET) 5-325 MG tablet  Every 4  hours PRN        08/13/21 0301             Note:  This document was prepared using Dragon voice recognition software and may include unintentional dictation errors.    Arta Silence, MD 08/13/21 [phone]

## 2021-08-13 NOTE — Discharge Instructions (Addendum)
Take the Percocet daily as prescribed, and ibuprofen every 6 hours with food even if you are not having severe pain at that time.  Then, take the Percocet only if needed for breakthrough pain.  Follow-up with your family physician next week as scheduled.  Return to the ER immediately for new, worsening, or persistent severe pain, weakness or numbness, difficulty walking, urinary retention or incontinence, or any other new or worsening symptoms that concern you.

## 2021-08-15 ENCOUNTER — Other Ambulatory Visit: Payer: Self-pay

## 2021-08-15 ENCOUNTER — Emergency Department
Admission: EM | Admit: 2021-08-15 | Discharge: 2021-08-15 | Disposition: A | Discharge status: Home / Self Care | Payer: BC Managed Care – PPO | Attending: Emergency Medicine | Admitting: Emergency Medicine

## 2021-08-15 ENCOUNTER — Emergency Department: Payer: BC Managed Care – PPO

## 2021-08-15 DIAGNOSIS — M79605 Pain in left leg: Secondary | ICD-10-CM | POA: Insufficient documentation

## 2021-08-15 DIAGNOSIS — M5126 Other intervertebral disc displacement, lumbar region: Secondary | ICD-10-CM | POA: Insufficient documentation

## 2021-08-15 DIAGNOSIS — M545 Low back pain, unspecified: Secondary | ICD-10-CM | POA: Diagnosis present

## 2021-08-15 MED ORDER — HYDROMORPHONE HCL 1 MG/ML IJ SOLN
1.0000 mg | Freq: Once | INTRAMUSCULAR | Status: AC
  Administered 2021-08-15: 1 mg via INTRAVENOUS
  Filled 2021-08-15: qty 1

## 2021-08-15 MED ORDER — KETOROLAC TROMETHAMINE 30 MG/ML IJ SOLN
30.0000 mg | Freq: Once | INTRAMUSCULAR | Status: AC
Start: 2021-08-15 — End: 2021-08-15
  Administered 2021-08-15: 30 mg via INTRAVENOUS
  Filled 2021-08-15: qty 1

## 2021-08-15 MED ORDER — METHOCARBAMOL 500 MG PO TABS: 500.0000 mg | ORAL_TABLET | Freq: Four times a day (QID) | ORAL | 1 refills | Status: DC

## 2021-08-15 MED ORDER — OXYCODONE-ACETAMINOPHEN 5-325 MG PO TABS
1.0000 | ORAL_TABLET | Freq: Once | ORAL | Status: AC
  Administered 2021-08-15: 1 via ORAL
  Filled 2021-08-15: qty 1

## 2021-08-15 MED ORDER — PREDNISONE 10 MG PO TABS: 10.0000 mg | ORAL_TABLET | ORAL | 0 refills | Status: DC

## 2021-08-15 MED ORDER — DEXAMETHASONE SODIUM PHOSPHATE 10 MG/ML IJ SOLN
10.0000 mg | Freq: Once | INTRAMUSCULAR | Status: AC
  Administered 2021-08-15: 10 mg via INTRAVENOUS
  Filled 2021-08-15: qty 1

## 2021-08-15 MED ORDER — ONDANSETRON HCL 4 MG/2ML IJ SOLN
4.0000 mg | Freq: Once | INTRAMUSCULAR | Status: AC
  Administered 2021-08-15: 4 mg via INTRAVENOUS
  Filled 2021-08-15: qty 2

## 2021-08-15 MED ORDER — OXYCODONE-ACETAMINOPHEN 5-325 MG PO TABS: 1.0000 | ORAL_TABLET | Freq: Four times a day (QID) | ORAL | 0 refills | Status: DC | PRN

## 2021-08-15 MED ORDER — ONDANSETRON HCL 4 MG/2ML IJ SOLN
4.0000 mg | Freq: Once | INTRAMUSCULAR | Status: AC
Start: 2021-08-15 — End: 2021-08-15
  Administered 2021-08-15: 4 mg via INTRAVENOUS
  Filled 2021-08-15: qty 2

## 2021-08-15 MED ORDER — KETOROLAC TROMETHAMINE 10 MG PO TABS: 10.0000 mg | ORAL_TABLET | Freq: Four times a day (QID) | ORAL | 0 refills | Status: DC | PRN

## 2021-08-15 MED ORDER — MORPHINE SULFATE (PF) 4 MG/ML IV SOLN
4.0000 mg | Freq: Once | INTRAVENOUS | Status: AC
  Administered 2021-08-15: 4 mg via INTRAVENOUS
  Filled 2021-08-15: qty 1

## 2021-08-15 MED ORDER — ORPHENADRINE CITRATE 30 MG/ML IJ SOLN
60.0000 mg | Freq: Once | INTRAMUSCULAR | Status: AC
  Administered 2021-08-15: 60 mg via INTRAVENOUS
  Filled 2021-08-15: qty 2

## 2021-08-15 NOTE — ED Notes (Signed)
See triage note  presents with lower back pain  states pain is moving into left leg  was seen on Friday for same  states pain is getting worse

## 2021-08-15 NOTE — ED Triage Notes (Signed)
C/o lower back pain radiating down left leg. Was seen Friday for same. No relief with prescribed Rx.

## 2021-08-15 NOTE — ED Provider Notes (Signed)
[facility] Provider Note  Patient Contact: 4:52 PM (approximate)   History   Back Pain   HPI  Albert Hill is a 46 y.o. male who presents the emergency department for sharp lower back pain radiating down the left leg.  Patient states that symptoms began a week ago.  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  He states that he was holding onto something but had his back twisted and as he moved he felt a sharp pain.  He has had increased pain running down the left leg.  He was seen in this department 3 days ago for same complaint, had reassuring x-ray and had improvement with medications here.  Have been sent home with Motrin, short course of prednisone and pain medications.  Pain is only been worsening.  No bowel or bladder dysfunction, saddle anesthesia, paresthesias.  All the symptoms are pain related.  Patient has been taking the prescribed medications as directed.  Patient also incidentally mentions that he is feeling like he has swelling in his left calf and more pain localized to his left calf at this time.  No history of DVT, PE or bleeding or clotting disorders.     Physical Exam   Triage Vital Signs: ED Triage Vitals [08/15/21 1410]  Enc Vitals Group     BP (!) 120/98     Pulse Rate (!) 107     Resp 18     Temp 98.4 F (36.9 C)     Temp Source Oral     SpO2 98 %     Weight 174 lb 2.6 oz (79 kg)     Height 5\' 9"  (1.753 m)     Head Circumference      Peak Flow      Pain Score 8     Pain Loc      Pain Edu?      Excl. in [place]?     Most recent vital signs: Vitals:   08/15/21 1729 08/15/21 1812  BP: (!) 118/92 109/89  Pulse: (!) 111 (!) 107  Resp: 19 18  Temp:    SpO2: 95% 94%     General: Alert and in no acute distress.  Cardiovascular:  Good peripheral perfusion Respiratory: Normal respiratory effort without tachypnea or retractions. Lungs  CTAB. Good air entry to the bases with no decreased or absent breath sounds Gastrointestinal: Bowel sounds 4 quadrants. Soft and nontender to palpation. No guarding or rigidity. No palpable masses. No distention. No CVA tenderness. Musculoskeletal: Full range of motion to all extremities.  Visualization of the lumbar spine reveals no visible signs of trauma.  Patient is tender in the L4-S1 distribution with midline extending through the SI joint into the sciatic notch.  Positive straight leg raise left side.  Palpation throughout the left lower leg revealed no palpable abnormalities.  He was tender over the calf as well.  Dorsalis pedis pulse intact.  Sensation intact in all dermatomes and equal to unaffected side. Neurologic:  No gross focal neurologic deficits are appreciated.  Skin:   No rash noted Other:   ED Results / Procedures / Treatments   Labs (all labs ordered are listed, but only abnormal results are displayed) Labs Reviewed - No data to display   EKG     RADIOLOGY  I personally viewed and evaluated these images as part of my medical  decision making, as well as reviewing the written report by the radiologist.  ED Provider Interpretation: I reviewed x-ray from 3 days ago.'s degenerative changes without acute findings on x-ray.  Ordered MRI for today, MRI returns with what appears to be a likely herniated disc pushing on the S1 nerve root left side.  Patient also has a hematoma in this area.  After reviewing these images I discussed the findings and report with on-call neurosurgeon, Dr. Cari Caraway.  Cari Caraway agrees with interpretation but states that there is no acute/emergent process and that this likely represents hematoma from acute herniated disc  MR LUMBAR SPINE WO CONTRAST  Result Date: 08/15/2021 CLINICAL DATA:  Low back and left leg pain. EXAM: MRI LUMBAR SPINE WITHOUT CONTRAST TECHNIQUE: Multiplanar, multisequence MR imaging of the lumbar spine was performed. No  intravenous contrast was administered. 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 Alignment:  Normal Vertebrae:  Normal marrow signal.  No bone lesions or fractures. Conus medullaris and cauda equina: Conus extends to the L1 level. Conus and cauda equina appear normal. Paraspinal and other soft tissues: No significant paraspinal or retroperitoneal findings. Disc levels: No significant findings at L1-2, L2-3, L3-4 or L4-5. L5-S1: Moderate-sized disc protrusion with what appears to be some surrounding hematoma. This is asymmetric left and there is significant mass effect on the left side of the thecal sac and involvement of the left S1 nerve root. No foraminal stenosis. S1-2: No significant findings. IMPRESSION: 1. 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 2. Moderate-sized disc protrusion at L5-S1 asymmetric left with significant mass effect on the left side of the thecal sac and involvement of the left S1 nerve root. Suspect some associated surrounding hematoma. Electronically Signed   By: Marijo Sanes M.D.   On: 08/15/2021 19:36   US Venous Img Lower Unilateral Left  Result Date: 08/15/2021 CLINICAL DATA:  Pain and swelling EXAM: LEFT LOWER EXTREMITY VENOUS DOPPLER ULTRASOUND TECHNIQUE: Gray-scale sonography with compression, as well as color and duplex ultrasound, were performed to evaluate the deep venous system(s) from the level of the common femoral vein through the popliteal and proximal calf veins. COMPARISON:  None. FINDINGS: VENOUS Normal compressibility of the common femoral, superficial femoral, and popliteal veins, as well as the visualized calf veins. Visualized portions of profunda femoral vein and great saphenous vein unremarkable. No filling defects to suggest DVT on  grayscale or color Doppler imaging. Doppler waveforms show normal direction of venous flow, normal respiratory plasticity and response to augmentation. Limited views of the contralateral common femoral vein are unremarkable. OTHER None. Limitations: none IMPRESSION: Negative. Electronically Signed   By: Dorise Bullion III M.D.   On: 08/15/2021 18:04    PROCEDURES:  Critical Care performed: No  Procedures   MEDICATIONS ORDERED IN ED: Medications  oxyCODONE-acetaminophen (PERCOCET/ROXICET) 5-325 MG per tablet 1 tablet (1 tablet Oral Given 08/15/21 1624)  morphine (PF) 4 MG/ML injection 4 mg (4 mg Intravenous Given 08/15/21 1726)  ondansetron (ZOFRAN) injection 4 mg (4 mg Intravenous Given 08/15/21 1725)  HYDROmorphone (DILAUDID) injection 1 mg (1 mg Intravenous Given 08/15/21 2058)  dexamethasone (DECADRON) injection 10 mg (10 mg Intravenous Given 08/15/21 2058)  ketorolac (TORADOL) 30 MG/ML injection 30 mg (30 mg Intravenous Given 08/15/21 2058)  orphenadrine (NORFLEX) injection 60 mg (60 mg Intravenous  Given 08/15/21 2058)  ondansetron [facility]) injection 4 mg (4 mg Intravenous Given 08/15/21 2057)     IMPRESSION / MDM / ASSESSMENT AND PLAN / ED COURSE  I reviewed the triage vital signs and the nursing notes.                              Differential diagnosis includes, but is not limited to, herniated disc, cauda equina, central cord compression, sciatica, compression fracture   Patient's diagnosis is consistent with herniated disc.  Patient presented to the emergency department with severe lower back pain radiating down the left leg.  Patient had been seen in this department 3 days ago but had worsening pain.  There is no neurodeficits but given the increasing pain after injury a week ago I felt that the patient did require MRI.  Patient was also complaining of calf pain with some reported edema.  Ultrasound was obtained to ensure no DVT.  Ultrasound is reassuring with no evidence of DVT.   MRI reveals bulging disc with surrounding hematoma.  Given this finding I did discuss the patient with on-call neurosurgeon, Dr. Cari Caraway.  After discussing the imaging with neurosurgery, they recommend discharging the patient with symptom control medications including steroids.  I will place the patient on oral Toradol after giving him IM Toradol here, prednisone after getting the patient IV Decadron, muscle relaxer.  Patient will also have a refill of his pain medication.  He will follow-up with neurosurgery for further management.  Concerning signs and symptoms are discussed with the patient and his wife.  These symptoms should prompt quick return to the ED for further evaluation.  Otherwise follow-up with neurosurgery as described above..  Patient is given ED precautions to return to the ED for any worsening or new symptoms.        FINAL CLINICAL IMPRESSION(S) / ED DIAGNOSES   Final diagnoses:  Lumbar herniated disc     Rx / DC Orders   ED Discharge Orders          Ordered    ketorolac (TORADOL) 10 MG tablet  Every 6 hours PRN        08/15/21 2051    predniSONE (DELTASONE) 10 MG tablet  As directed       Note to Pharmacy: Take on a pattern of 6, 6, 5, 5, 4, 4, 3, 3, 2, 2, 1, 1   08/15/21 2051    methocarbamol (ROBAXIN) 500 MG tablet  4 times daily        08/15/21 2051    oxyCODONE-acetaminophen (PERCOCET/ROXICET) 5-325 MG tablet  Every 6 hours PRN        08/15/21 2051             Note:  This document was prepared using Dragon voice recognition software and may include unintentional dictation errors.   Brynda Peon 08/15/21 2105    Duffy Bruce, MD 08/16/21 [phone]

## 2021-09-09 ENCOUNTER — Other Ambulatory Visit: Payer: Self-pay | Admitting: Neurosurgery

## 2021-09-09 DIAGNOSIS — Z01818 Encounter for other preprocedural examination: Secondary | ICD-10-CM

## 2021-09-13 ENCOUNTER — Other Ambulatory Visit: Payer: Self-pay

## 2021-09-13 ENCOUNTER — Encounter
Admission: RE | Admit: 2021-09-13 | Discharge: 2021-09-13 | Disposition: A | Discharge status: Home / Self Care | Payer: BC Managed Care – PPO | Source: Ambulatory Visit | Attending: Neurosurgery | Admitting: Neurosurgery

## 2021-09-13 VITALS — BP 113/80 | HR 116 | Temp 97.7°F | Resp 18 | Ht 69.0 in | Wt 163.6 lb

## 2021-09-13 DIAGNOSIS — Z01818 Encounter for other preprocedural examination: Secondary | ICD-10-CM

## 2021-09-13 DIAGNOSIS — Z01812 Encounter for preprocedural laboratory examination: Secondary | ICD-10-CM | POA: Insufficient documentation

## 2021-09-13 HISTORY — DX: Sleep apnea, unspecified: G47.30

## 2021-09-13 LAB — URINALYSIS, COMPLETE (UACMP) WITH MICROSCOPIC
Bacteria, UA: NONE SEEN
Bilirubin Urine: NEGATIVE
Glucose, UA: NEGATIVE mg/dL
Ketones, ur: NEGATIVE mg/dL
Nitrite: NEGATIVE
Protein, ur: 30 mg/dL — AB
Specific Gravity, Urine: 1.025 (ref 1.005–1.030)
Squamous Epithelial / HPF: NONE SEEN (ref 0–5)
pH: 5 (ref 5.0–8.0)

## 2021-09-13 LAB — TYPE AND SCREEN
ABO/RH(D): O POS
Antibody Screen: NEGATIVE

## 2021-09-13 LAB — SURGICAL PCR SCREEN
MRSA, PCR: NEGATIVE
Staphylococcus aureus: NEGATIVE

## 2021-09-13 NOTE — Patient Instructions (Addendum)
Your procedure is scheduled on: Friday 09/16/21 ?Report to the Registration Desk on the 1st floor of the [facility]. ?To find out your arrival time, please call [phone] between 1PM - 3PM on: Thursday 09/15/21 ? ?REMEMBER: ?Instructions that are not followed completely may result in serious medical risk, up to and including death; or upon the discretion of your surgeon and anesthesiologist your surgery may need to be rescheduled. ? ?Do not eat food after midnight the night before surgery.  ?No gum chewing, lozengers or hard candies. ? ?You may however, drink CLEAR liquids up to 2 hours before you are scheduled to arrive for your surgery. Do not drink anything within 2 hours of your scheduled arrival time. ? ?Clear liquids include: ?- water  ?- apple juice without pulp ?- gatorade (not RED colors) ?- black coffee or tea (Do NOT add milk or creamers to the coffee or tea) ?Do NOT drink anything that is not on this list. ? ?TAKE THESE MEDICATIONS THE MORNING OF SURGERY WITH A SIP OF WATER: ?gabapentin (NEURONTIN) 300 MG capsule ? ? ?omeprazole (PRILOSEC OTC) 20 MG tablet (take one the night before and one on the morning of surgery - helps to prevent nausea after surgery.) ? ?Use your Albuterol Sulfate, sensor, (PROAIR DIGIHALER) 108 (90 Base) MCG/ACT AEPB and Tiotropium Bromide-Olodaterol (STIOLTO RESPIMAT) 2.5-2.5 MCG/ACT AERS inhalers on the day of surgery and bring to the hospital. ? ? ?One week prior to surgery: ?Stop Anti-inflammatories (NSAIDS) such as Advil, Aleve, Ibuprofen, Motrin, Naproxen 500 mg, Naprosyn and Aspirin based products such as Excedrin, Goodys Powder, BC Powder. ? ?Stop taking your Multiple Vitamin (MULTIVITAMIN WITH MINERALS) TABS tablet ANY OVER THE COUNTER supplements until after surgery. ? ?You may however, continue to take Tylenol if needed for pain up until the day of surgery. ? ?No Alcohol for 24 hours before or after surgery. ? ?No Smoking including e-cigarettes for 24 hours prior  to surgery.  ?No chewable tobacco products for at least 6 hours prior to surgery.  ?No nicotine patches on the day of surgery. ? ?Do not use any "recreational" drugs for at least a week prior to your surgery.  ?Please be advised that the combination of cocaine and anesthesia may have negative outcomes, up to and including death. ?If you test positive for cocaine, your surgery will be cancelled. ? ?On the morning of surgery brush your teeth with toothpaste and water, you may rinse your mouth with mouthwash if you wish. ?Do not swallow any toothpaste or mouthwash. ? ?Use CHG Soap as directed on instruction sheet. ? ?Do not wear jewelry. ? ?Do not wear lotions, powders, or colognes.  ? ?Do not shave body from the neck down 48 hours prior to surgery just in case you cut yourself which could leave a site for infection.  ?Also, freshly shaved skin may become irritated if using the CHG soap. ? ?Do not bring valuables to the hospital. [facility] is not responsible for any missing/lost belongings or valuables.  ? ?Notify your doctor if there is any change in your medical condition (cold, fever, infection). ? ?Wear comfortable clothing (specific to your surgery type) to the hospital. ? ?If you are being discharged the day of surgery, you will not be allowed to drive home. ?You will need a responsible adult (18 years or older) to drive you home and stay with you that night.  ? ?If you are taking public transportation, you will need to have a responsible adult (18 years or  older) with you. ?Please confirm with your physician that it is acceptable to use public transportation.  ? ?Please call the [place] [facility] at [phone] if you have any questions about these instructions. ? ?Surgery Visitation Policy: ? ?Patients undergoing a surgery or procedure may have two family members or support persons with them as long as the person is not COVID-19 positive or experiencing its symptoms.  ? ?Inpatient Visitation:    ? ?Visiting hours are 7 a.m. to 8 p.m. ?Up to four visitors are allowed at one time in a patient room, including children. The visitors may rotate out with other people during the day. One designated support person (adult) may remain overnight.  ?

## 2021-09-15 LAB — URINE CULTURE: Culture: NO GROWTH

## 2021-09-15 MED ORDER — CHLORHEXIDINE GLUCONATE 0.12 % MT SOLN: 15.0000 mL | Freq: Once | OROMUCOSAL | Status: AC

## 2021-09-15 MED ORDER — CEFAZOLIN SODIUM-DEXTROSE 2-4 GM/100ML-% IV SOLN
2.0000 g | INTRAVENOUS | Status: AC
  Administered 2021-09-16: 2 g via INTRAVENOUS

## 2021-09-15 MED ORDER — LACTATED RINGERS IV SOLN: INTRAVENOUS | Status: DC

## 2021-09-15 MED ORDER — ORAL CARE MOUTH RINSE: 15.0000 mL | Freq: Once | OROMUCOSAL | Status: AC

## 2021-09-15 NOTE — Progress Notes (Signed)
Pharmacy Antibiotic Note ? ?Albert Hill is a 46 y.o. male admitted on (Not on file) with surgical prophylaxis.  Pharmacy has been consulted for Cefazolin dosing. ? ?Plan: ?Cefazolin 2 gm IV X 1 60 min pre-op ordered for 3/31 @ 0500.  ? ?  ? ?No data recorded. ? ?No results for input(s): WBC, CREATININE, LATICACIDVEN, VANCOTROUGH, VANCOPEAK, VANCORANDOM, GENTTROUGH, GENTPEAK, GENTRANDOM, TOBRATROUGH, TOBRAPEAK, TOBRARND, AMIKACINPEAK, AMIKACINTROU, AMIKACIN in the last 168 hours.  ?CrCl cannot be calculated (Patient's most recent lab result is older than the maximum 21 days allowed.).   ? ?No Known Allergies ? ?Antimicrobials this admission: ?  >>  ?  >>  ? ?Dose adjustments this admission: ? ? ?Microbiology results: ? BCx:  ? UCx:   ? Sputum:   ? MRSA PCR:  ? ?Thank you for allowing pharmacy to be a part of this patient?s care. ? ?Sakia Schrimpf D ?09/15/2021 10:17 PM ? ?

## 2021-09-16 ENCOUNTER — Ambulatory Visit: Payer: BC Managed Care – PPO

## 2021-09-16 ENCOUNTER — Observation Stay
Admission: RE | Admit: 2021-09-16 | Discharge: 2021-09-17 | Disposition: A | Discharge status: Home / Self Care | Payer: BC Managed Care – PPO | Source: Ambulatory Visit | Attending: Neurosurgery | Admitting: Neurosurgery

## 2021-09-16 ENCOUNTER — Other Ambulatory Visit: Payer: Self-pay

## 2021-09-16 ENCOUNTER — Encounter
Admission: RE | Disposition: A | Discharge status: Home / Self Care | Payer: Self-pay | Source: Ambulatory Visit | Attending: Neurosurgery

## 2021-09-16 ENCOUNTER — Encounter: Payer: Self-pay | Admitting: Neurosurgery

## 2021-09-16 ENCOUNTER — Ambulatory Visit: Payer: BC Managed Care – PPO | Admitting: Urgent Care

## 2021-09-16 DIAGNOSIS — M5416 Radiculopathy, lumbar region: Secondary | ICD-10-CM | POA: Diagnosis present

## 2021-09-16 DIAGNOSIS — Z9889 Other specified postprocedural states: Secondary | ICD-10-CM

## 2021-09-16 HISTORY — PX: LUMBAR LAMINECTOMY/DECOMPRESSION MICRODISCECTOMY: SHX5026

## 2021-09-16 LAB — ABO/RH: ABO/RH(D): O POS

## 2021-09-16 SURGERY — LUMBAR LAMINECTOMY/DECOMPRESSION MICRODISCECTOMY 1 LEVEL
Anesthesia: General | Laterality: Left

## 2021-09-16 MED ORDER — KETOROLAC TROMETHAMINE 15 MG/ML IJ SOLN
15.0000 mg | Freq: Four times a day (QID) | INTRAMUSCULAR | Status: DC
  Administered 2021-09-16 – 2021-09-17 (×3): 15 mg via INTRAVENOUS
  Filled 2021-09-16 (×3): qty 1

## 2021-09-16 MED ORDER — FLUTICASONE PROPIONATE 50 MCG/ACT NA SUSP: 2.0000 | Freq: Every day | NASAL | Status: DC | PRN

## 2021-09-16 MED ORDER — GLYCOPYRROLATE 0.2 MG/ML IJ SOLN
INTRAMUSCULAR | Status: DC | PRN
  Administered 2021-09-16: .2 mg via INTRAVENOUS

## 2021-09-16 MED ORDER — ROCURONIUM BROMIDE 10 MG/ML (PF) SYRINGE
PREFILLED_SYRINGE | INTRAVENOUS | Status: AC
  Filled 2021-09-16: qty 10

## 2021-09-16 MED ORDER — LIDOCAINE HCL 4 % MT SOLN
OROMUCOSAL | Status: DC | PRN
  Administered 2021-09-16: 4 mL via TOPICAL

## 2021-09-16 MED ORDER — MENTHOL 3 MG MT LOZG: 1.0000 | LOZENGE | OROMUCOSAL | Status: DC | PRN

## 2021-09-16 MED ORDER — SODIUM CHLORIDE (PF) 0.9 % IJ SOLN
INTRAMUSCULAR | Status: DC | PRN
  Administered 2021-09-16: 56 mL

## 2021-09-16 MED ORDER — METHYLPREDNISOLONE ACETATE 40 MG/ML IJ SUSP
INTRAMUSCULAR | Status: AC
  Filled 2021-09-16: qty 1

## 2021-09-16 MED ORDER — BUPIVACAINE LIPOSOME 1.3 % IJ SUSP
INTRAMUSCULAR | Status: AC
  Filled 2021-09-16: qty 20

## 2021-09-16 MED ORDER — PANTOPRAZOLE SODIUM 40 MG PO TBEC
40.0000 mg | DELAYED_RELEASE_TABLET | Freq: Every day | ORAL | Status: DC
  Administered 2021-09-17: 40 mg via ORAL
  Filled 2021-09-16: qty 1

## 2021-09-16 MED ORDER — DEXAMETHASONE SODIUM PHOSPHATE 10 MG/ML IJ SOLN
INTRAMUSCULAR | Status: AC
  Filled 2021-09-16: qty 1

## 2021-09-16 MED ORDER — ACETAMINOPHEN 10 MG/ML IV SOLN: 1000.0000 mg | Freq: Once | INTRAVENOUS | Status: DC | PRN

## 2021-09-16 MED ORDER — ONDANSETRON HCL 4 MG/2ML IJ SOLN
INTRAMUSCULAR | Status: DC | PRN
  Administered 2021-09-16: 4 mg via INTRAVENOUS

## 2021-09-16 MED ORDER — METHOCARBAMOL 500 MG PO TABS
500.0000 mg | ORAL_TABLET | Freq: Four times a day (QID) | ORAL | Status: DC | PRN
  Administered 2021-09-17: 500 mg via ORAL
  Filled 2021-09-16: qty 1

## 2021-09-16 MED ORDER — ONDANSETRON HCL 4 MG/2ML IJ SOLN
INTRAMUSCULAR | Status: AC
  Filled 2021-09-16: qty 2

## 2021-09-16 MED ORDER — HYDROMORPHONE HCL 1 MG/ML IJ SOLN
INTRAMUSCULAR | Status: AC
Start: 2021-09-16 — End: ?
  Filled 2021-09-16: qty 1

## 2021-09-16 MED ORDER — SODIUM CHLORIDE FLUSH 0.9 % IV SOLN
INTRAVENOUS | Status: AC
  Filled 2021-09-16: qty 20

## 2021-09-16 MED ORDER — ACETAMINOPHEN 10 MG/ML IV SOLN
INTRAVENOUS | Status: AC
  Filled 2021-09-16: qty 100

## 2021-09-16 MED ORDER — ONDANSETRON HCL 4 MG PO TABS: 4.0000 mg | ORAL_TABLET | Freq: Four times a day (QID) | ORAL | Status: DC | PRN

## 2021-09-16 MED ORDER — BUPIVACAINE-EPINEPHRINE (PF) 0.5% -1:200000 IJ SOLN
INTRAMUSCULAR | Status: DC | PRN
  Administered 2021-09-16: 6 mL

## 2021-09-16 MED ORDER — DEXAMETHASONE SODIUM PHOSPHATE 10 MG/ML IJ SOLN
INTRAMUSCULAR | Status: DC | PRN
  Administered 2021-09-16: 10 mg via INTRAVENOUS

## 2021-09-16 MED ORDER — LISINOPRIL 10 MG PO TABS
10.0000 mg | ORAL_TABLET | Freq: Every day | ORAL | Status: DC
  Administered 2021-09-17: 10 mg via ORAL
  Filled 2021-09-16: qty 1

## 2021-09-16 MED ORDER — PROPOFOL 10 MG/ML IV BOLUS
INTRAVENOUS | Status: DC | PRN
  Administered 2021-09-16: 200 mg via INTRAVENOUS

## 2021-09-16 MED ORDER — OXYCODONE HCL 5 MG/5ML PO SOLN: 5.0000 mg | Freq: Once | ORAL | Status: AC | PRN

## 2021-09-16 MED ORDER — OXYCODONE HCL 5 MG PO TABS
5.0000 mg | ORAL_TABLET | ORAL | Status: DC | PRN
  Administered 2021-09-16: 5 mg via ORAL
  Filled 2021-09-16: qty 1

## 2021-09-16 MED ORDER — SUCCINYLCHOLINE CHLORIDE 200 MG/10ML IV SOSY
PREFILLED_SYRINGE | INTRAVENOUS | Status: DC | PRN
  Administered 2021-09-16: 100 mg via INTRAVENOUS

## 2021-09-16 MED ORDER — SODIUM CHLORIDE 0.9% FLUSH
3.0000 mL | Freq: Two times a day (BID) | INTRAVENOUS | Status: DC
  Administered 2021-09-16 – 2021-09-17 (×2): 3 mL via INTRAVENOUS

## 2021-09-16 MED ORDER — ONDANSETRON HCL 4 MG/2ML IJ SOLN
INTRAMUSCULAR | Status: AC
  Filled 2021-09-16: qty 4

## 2021-09-16 MED ORDER — SODIUM CHLORIDE 0.9% FLUSH: 3.0000 mL | INTRAVENOUS | Status: DC | PRN

## 2021-09-16 MED ORDER — ALBUTEROL SULFATE (2.5 MG/3ML) 0.083% IN NEBU: 2.5000 mg | INHALATION_SOLUTION | Freq: Four times a day (QID) | RESPIRATORY_TRACT | Status: DC | PRN

## 2021-09-16 MED ORDER — CHLORHEXIDINE GLUCONATE 0.12 % MT SOLN
OROMUCOSAL | Status: AC
  Administered 2021-09-16: 15 mL via OROMUCOSAL
  Filled 2021-09-16: qty 15

## 2021-09-16 MED ORDER — GABAPENTIN 300 MG PO CAPS
300.0000 mg | ORAL_CAPSULE | Freq: Three times a day (TID) | ORAL | Status: DC
  Administered 2021-09-16 – 2021-09-17 (×2): 300 mg via ORAL
  Filled 2021-09-16 (×2): qty 1

## 2021-09-16 MED ORDER — FENTANYL CITRATE (PF) 100 MCG/2ML IJ SOLN
INTRAMUSCULAR | Status: DC | PRN
  Administered 2021-09-16: 100 ug via INTRAVENOUS

## 2021-09-16 MED ORDER — OXYCODONE HCL 5 MG PO TABS
10.0000 mg | ORAL_TABLET | ORAL | Status: DC | PRN
  Administered 2021-09-17 (×2): 10 mg via ORAL
  Filled 2021-09-16 (×2): qty 2

## 2021-09-16 MED ORDER — MIDAZOLAM HCL 2 MG/2ML IJ SOLN
INTRAMUSCULAR | Status: DC | PRN
  Administered 2021-09-16: 2 mg via INTRAVENOUS

## 2021-09-16 MED ORDER — EPHEDRINE SULFATE (PRESSORS) 50 MG/ML IJ SOLN
INTRAMUSCULAR | Status: DC | PRN
  Administered 2021-09-16: 15 mg via INTRAVENOUS
  Administered 2021-09-16: 10 mg via INTRAVENOUS
  Administered 2021-09-16: 15 mg via INTRAVENOUS
  Administered 2021-09-16: 10 mg via INTRAVENOUS

## 2021-09-16 MED ORDER — HYDROMORPHONE HCL 1 MG/ML IJ SOLN: 0.5000 mg | INTRAMUSCULAR | Status: DC | PRN

## 2021-09-16 MED ORDER — PHENYLEPHRINE HCL (PRESSORS) 10 MG/ML IV SOLN
INTRAVENOUS | Status: DC | PRN
  Administered 2021-09-16 (×10): 160 ug via INTRAVENOUS

## 2021-09-16 MED ORDER — PROPOFOL 10 MG/ML IV BOLUS
INTRAVENOUS | Status: AC
  Filled 2021-09-16: qty 40

## 2021-09-16 MED ORDER — SODIUM CHLORIDE 0.9 % IV SOLN: INTRAVENOUS | Status: DC

## 2021-09-16 MED ORDER — SUCCINYLCHOLINE CHLORIDE 200 MG/10ML IV SOSY
PREFILLED_SYRINGE | INTRAVENOUS | Status: AC
  Filled 2021-09-16: qty 10

## 2021-09-16 MED ORDER — ROCURONIUM BROMIDE 100 MG/10ML IV SOLN
INTRAVENOUS | Status: DC | PRN
  Administered 2021-09-16: 5 mg via INTRAVENOUS

## 2021-09-16 MED ORDER — ONDANSETRON HCL 4 MG/2ML IJ SOLN: 4.0000 mg | Freq: Four times a day (QID) | INTRAMUSCULAR | Status: DC | PRN

## 2021-09-16 MED ORDER — HYDROMORPHONE HCL 1 MG/ML IJ SOLN
INTRAMUSCULAR | Status: DC | PRN
  Administered 2021-09-16: .5 mg via INTRAVENOUS

## 2021-09-16 MED ORDER — 0.9 % SODIUM CHLORIDE (POUR BTL) OPTIME
TOPICAL | Status: DC | PRN
  Administered 2021-09-16: 60 mL

## 2021-09-16 MED ORDER — ACETAMINOPHEN 10 MG/ML IV SOLN
INTRAVENOUS | Status: DC | PRN
  Administered 2021-09-16: 1000 mg via INTRAVENOUS

## 2021-09-16 MED ORDER — OXYCODONE HCL 5 MG PO TABS
ORAL_TABLET | ORAL | Status: AC
  Filled 2021-09-16: qty 1

## 2021-09-16 MED ORDER — FENTANYL CITRATE (PF) 100 MCG/2ML IJ SOLN
INTRAMUSCULAR | Status: AC
Start: 2021-09-16 — End: ?
  Filled 2021-09-16: qty 2

## 2021-09-16 MED ORDER — DEXTROSE 5 % IV SOLN
500.0000 mg | Freq: Four times a day (QID) | INTRAVENOUS | Status: DC | PRN
  Filled 2021-09-16: qty 5

## 2021-09-16 MED ORDER — FIBRIN SEALANT 2 ML SINGLE DOSE KIT
PACK | CUTANEOUS | Status: DC | PRN
  Administered 2021-09-16: 2 mL via TOPICAL

## 2021-09-16 MED ORDER — BUPIVACAINE HCL (PF) 0.5 % IJ SOLN
INTRAMUSCULAR | Status: AC
  Filled 2021-09-16: qty 30

## 2021-09-16 MED ORDER — CEFAZOLIN SODIUM-DEXTROSE 2-4 GM/100ML-% IV SOLN
INTRAVENOUS | Status: AC
  Filled 2021-09-16: qty 100

## 2021-09-16 MED ORDER — SURGIFLO WITH THROMBIN (HEMOSTATIC MATRIX KIT) OPTIME
TOPICAL | Status: DC | PRN
  Administered 2021-09-16: 1 via TOPICAL

## 2021-09-16 MED ORDER — ALBUTEROL SULFATE (SENSOR) 108 (90 BASE) MCG/ACT IN AEPB: 2.0000 | INHALATION_SPRAY | Freq: Four times a day (QID) | RESPIRATORY_TRACT | Status: DC | PRN

## 2021-09-16 MED ORDER — ONDANSETRON HCL 4 MG/2ML IJ SOLN: 4.0000 mg | Freq: Once | INTRAMUSCULAR | Status: DC | PRN

## 2021-09-16 MED ORDER — ALLOPURINOL 300 MG PO TABS
300.0000 mg | ORAL_TABLET | Freq: Every day | ORAL | Status: DC
  Administered 2021-09-17: 300 mg via ORAL
  Filled 2021-09-16: qty 1

## 2021-09-16 MED ORDER — MIDAZOLAM HCL 2 MG/2ML IJ SOLN
INTRAMUSCULAR | Status: AC
Start: 2021-09-16 — End: ?
  Filled 2021-09-16: qty 2

## 2021-09-16 MED ORDER — DOCUSATE SODIUM 100 MG PO CAPS
100.0000 mg | ORAL_CAPSULE | Freq: Two times a day (BID) | ORAL | Status: DC
  Administered 2021-09-16 – 2021-09-17 (×2): 100 mg via ORAL
  Filled 2021-09-16 (×2): qty 1

## 2021-09-16 MED ORDER — PHENOL 1.4 % MT LIQD: 1.0000 | OROMUCOSAL | Status: DC | PRN

## 2021-09-16 MED ORDER — FENTANYL CITRATE (PF) 100 MCG/2ML IJ SOLN
INTRAMUSCULAR | Status: AC
  Administered 2021-09-16: 25 ug via INTRAVENOUS
  Filled 2021-09-16: qty 2

## 2021-09-16 MED ORDER — OXYCODONE HCL 5 MG PO TABS
5.0000 mg | ORAL_TABLET | Freq: Once | ORAL | Status: AC | PRN
  Administered 2021-09-16: 5 mg via ORAL

## 2021-09-16 MED ORDER — FENTANYL CITRATE (PF) 100 MCG/2ML IJ SOLN
25.0000 ug | INTRAMUSCULAR | Status: AC | PRN
  Administered 2021-09-16 (×4): 25 ug via INTRAVENOUS

## 2021-09-16 MED ORDER — LIDOCAINE HCL (PF) 2 % IJ SOLN
INTRAMUSCULAR | Status: AC
  Filled 2021-09-16: qty 5

## 2021-09-16 MED ORDER — BUPIVACAINE-EPINEPHRINE (PF) 0.5% -1:200000 IJ SOLN
INTRAMUSCULAR | Status: AC
  Filled 2021-09-16: qty 30

## 2021-09-16 MED ORDER — SENNA 8.6 MG PO TABS
1.0000 | ORAL_TABLET | Freq: Two times a day (BID) | ORAL | Status: DC
  Administered 2021-09-16 – 2021-09-17 (×2): 8.6 mg via ORAL
  Filled 2021-09-16 (×2): qty 1

## 2021-09-16 MED ORDER — SODIUM CHLORIDE 0.9 % IV SOLN: 250.0000 mL | INTRAVENOUS | Status: DC

## 2021-09-16 MED ORDER — LIDOCAINE HCL (CARDIAC) PF 100 MG/5ML IV SOSY
PREFILLED_SYRINGE | INTRAVENOUS | Status: DC | PRN
  Administered 2021-09-16: 10 mg via INTRAVENOUS

## 2021-09-16 MED ORDER — ACETAMINOPHEN 500 MG PO TABS
1000.0000 mg | ORAL_TABLET | Freq: Four times a day (QID) | ORAL | Status: DC
  Administered 2021-09-16 – 2021-09-17 (×3): 1000 mg via ORAL
  Filled 2021-09-16 (×3): qty 2

## 2021-09-16 SURGICAL SUPPLY — 52 items
BUR NEURO DRILL SOFT 3.0X3.8M (BURR) ×2 IMPLANT
CHLORAPREP W/TINT 26 (MISCELLANEOUS) ×4 IMPLANT
CNTNR SPEC 2.5X3XGRAD LEK (MISCELLANEOUS) ×1
CONT SPEC 4OZ STER OR WHT (MISCELLANEOUS) ×1
CONT SPEC 4OZ STRL OR WHT (MISCELLANEOUS) ×1
CONTAINER SPEC 2.5X3XGRAD LEK (MISCELLANEOUS) ×1 IMPLANT
COUNTER NEEDLE 20/40 LG (NEEDLE) ×2 IMPLANT
CUP MEDICINE 2OZ PLAST GRAD ST (MISCELLANEOUS) ×4 IMPLANT
DRAPE C ARM PK CFD 31 SPINE (DRAPES) ×2 IMPLANT
DRAPE LAPAROTOMY 100X77 ABD (DRAPES) ×2 IMPLANT
DRAPE MICROSCOPE SPINE 48X150 (DRAPES) ×2 IMPLANT
DRAPE SURG 17X11 SM STRL (DRAPES) ×2 IMPLANT
DRSG OPSITE POSTOP 3X4 (GAUZE/BANDAGES/DRESSINGS) ×1 IMPLANT
ELECT CAUTERY BLADE TIP 2.5 (TIP) ×2
ELECT EZSTD 165MM 6.5IN (MISCELLANEOUS)
ELECT REM PT RETURN 9FT ADLT (ELECTROSURGICAL) ×2
ELECTRODE CAUTERY BLDE TIP 2.5 (TIP) ×1 IMPLANT
ELECTRODE EZSTD 165MM 6.5IN (MISCELLANEOUS) IMPLANT
ELECTRODE REM PT RTRN 9FT ADLT (ELECTROSURGICAL) ×1 IMPLANT
GAUZE 4X4 16PLY ~~LOC~~+RFID DBL (SPONGE) ×2 IMPLANT
GLOVE SURG SYN 6.5 ES PF (GLOVE) ×4 IMPLANT
GLOVE SURG SYN 6.5 PF PI (GLOVE) ×2 IMPLANT
GLOVE SURG SYN 8.5  E (GLOVE) ×6
GLOVE SURG SYN 8.5 E (GLOVE) ×3 IMPLANT
GLOVE SURG SYN 8.5 PF PI (GLOVE) ×3 IMPLANT
GLOVE SURG UNDER POLY LF SZ6.5 (GLOVE) ×2 IMPLANT
GOWN SRG LRG LVL 4 IMPRV REINF (GOWNS) ×1 IMPLANT
GOWN SRG XL LVL 3 NONREINFORCE (GOWNS) ×1 IMPLANT
GOWN STRL NON-REIN TWL XL LVL3 (GOWNS) ×2
GOWN STRL REIN LRG LVL4 (GOWNS) ×2
GRADUATE 1200CC STRL 31836 (MISCELLANEOUS) ×2 IMPLANT
GRAFT DURAGEN MATRIX 1WX1L (Tissue) ×1 IMPLANT
KIT SPINAL PRONEVIEW (KITS) ×2 IMPLANT
MANIFOLD NEPTUNE II (INSTRUMENTS) ×2 IMPLANT
MARKER SKIN DUAL TIP RULER LAB (MISCELLANEOUS) ×4 IMPLANT
NDL SAFETY ECLIPSE 18X1.5 (NEEDLE) ×1 IMPLANT
NEEDLE HYPO 18GX1.5 SHARP (NEEDLE) ×2
NEEDLE HYPO 22GX1.5 SAFETY (NEEDLE) ×2 IMPLANT
NS IRRIG 1000ML POUR BTL (IV SOLUTION) ×2 IMPLANT
PACK LAMINECTOMY NEURO (CUSTOM PROCEDURE TRAY) ×2 IMPLANT
PAD ARMBOARD 7.5X6 YLW CONV (MISCELLANEOUS) ×2 IMPLANT
SURGIFLO W/THROMBIN 8M KIT (HEMOSTASIS) ×2 IMPLANT
SUT VIC AB 0 CT1 27 (SUTURE) ×2
SUT VIC AB 0 CT1 27XCR 8 STRN (SUTURE) ×1 IMPLANT
SUT VIC AB 2-0 CT1 18 (SUTURE) ×2 IMPLANT
SYR 10ML LL (SYRINGE) ×2 IMPLANT
SYR 20ML LL LF (SYRINGE) ×2 IMPLANT
SYR 30ML LL (SYRINGE) ×4 IMPLANT
SYR 3ML LL SCALE MARK (SYRINGE) ×2 IMPLANT
TOWEL OR 17X26 4PK STRL BLUE (TOWEL DISPOSABLE) ×6 IMPLANT
TUBING CONNECTING 10 (TUBING) ×2 IMPLANT
WATER STERILE IRR 500ML POUR (IV SOLUTION) ×2 IMPLANT

## 2021-09-16 NOTE — Op Note (Signed)
Indications: [name] presented with the indications below.  Due to weakness, he was offered and selected moving forward with surgery. ? ?Findings: large disc herniation, partially intradural ? ?Preoperative Diagnosis: Lumbar radiculopathy M54.16, Left leg weakness R29.898 ?Postoperative Diagnosis: same ? ? ?EBL: 10 ml ?IVF: see AR ml ?Drains: none ?Disposition: Extubated and Stable to PACU ?Complications: none ? ?No foley catheter was placed. ? ? ?Preoperative Note:  ? ?Risks of surgery discussed include: infection, bleeding, stroke, coma, death, paralysis, CSF leak, nerve/spinal cord injury, numbness, tingling, weakness, complex regional pain syndrome, recurrent stenosis and/or disc herniation, vascular injury, development of instability, neck/back pain, need for further surgery, persistent symptoms, development of deformity, and the risks of anesthesia. The patient understood these risks and agreed to proceed. ? ?Operative Note:  ? ?1) Left L5/S1 microdiscectomy ? ?The patient was then brought from the preoperative center with intravenous access established.  The patient underwent general anesthesia and endotracheal tube intubation, and was then rotated on the [place] rail top where all pressure points were appropriately padded.  The skin was then thoroughly cleansed.  Perioperative antibiotic prophylaxis was administered.  Sterile prep and drapes were then applied and a timeout was then observed.  C-arm was brought into the field under sterile conditions, and the L5-S1 disc space identified and marked with an incision on the left 1cm lateral to midline. ? ?Note that we used the convention on numbering that was used in the MRI. ? ?Once this was complete a 2 cm incision was opened with the use of a #10 blade knife.  The Metrx tubes were sequentially advanced under lateral fluoroscopy until a 18 x 50 mm Metrx tube was placed over the facet and lamina and secured to the bed.   ? ?The microscope was then sterilely  brought into the field and muscle creep was hemostased with a bipolar and resected with a pituitary rongeur.  A Bovie extender was then used to expose the spinous process and lamina.  Careful attention was placed to not violate the facet capsule. A 3 mm matchstick drill bit was then used to make a hemi-laminotomy trough until the ligamentum flavum was exposed.  This was extended to the base of the spinous process.  Once this was complete and the underlying ligamentum flavum was visualized this was dissected with an up angle curette and resected with a #2 and #3 mm biting Kerrison.  The laminotomy opening was also expanded in similar fashion and hemostasis was obtained with Surgifoam and a patty as well as bone wax.  The rostral aspect of the caudal level of the lamina was also resected with a #2 biting Kerrison effort to further enhance exposure.  Once the underlying dura was visualized a Penfield 4 was then used to dissect and expose the traversing nerve root.  Once this was identified a nerve root retractor suction was used to mobilize this medially.  The venous plexus was hemostased with Surgifoam and light bipolar use.  A small penfied was then used to make a small annulotomy within the disc space and disc space contents were noted to come through the annulus.   ? ?The disc herniation was identified and dissected free using a balltip probe. The pituitary rongeur was used to remove the extruded disc fragments. During removal, a portion of the disc fragment was noted to be eroded into the dura underneath the axilla of the Sa nerve root.  When this was removed, a CSF leak occurred due to the erosion of the  dura.  This occurred during the normal performance of the procedure. Once the thecal sac and nerve root were noted to be relaxed and under less tension the ball-tipped feeler was passed along the foramen distally to to ensure no residual compression was noted.   ? ?Depo-Medrol was placed along the nerve root.   The area was irrigated. Duragen and tisseel were used to augment dural closure.  The tube system was then removed under microscopic visualization and hemostasis was obtained with a bipolar.   ? ?The fascial layer was reapproximated with the use of a 0- Vicryl suture.  Subcutaneous tissue layer was reapproximated using 2-0 Vicryl suture.  3-0 nylon was used on the skin. The skin was then cleansed and dressed.  Patient was then rotated back to the preoperative bed awakened from anesthesia and taken to recovery all counts are correct in this case. ? ? ?I performed the entire procedure with the assistance of Cooper Render PA as an Pensions consultant. ? ?Meade Maw MD  ?

## 2021-09-16 NOTE — Discharge Instructions (Signed)
?  Your surgeon has performed an operation on your lumbar spine (low back) to relieve pressure on one or more nerves. Many times, patients feel better immediately after surgery and can ?overdo it.? Even if you feel well, it is important that you follow these activity guidelines. If you do not let your back heal properly from the surgery, you can increase the chance of a disc herniation and/or return of your symptoms. The following are instructions to help in your recovery once you have been discharged from the hospital. ? ? ?Activity  ?  ?No bending, lifting, or twisting (?BLT?). Avoid lifting objects heavier than 10 pounds (gallon milk jug).  Where possible, avoid household activities that involve lifting, bending, pushing, or pulling such as laundry, vacuuming, grocery shopping, and childcare. Try to arrange for help from friends and family for these activities while your back heals. ? ?Increase physical activity slowly as tolerated.  Taking short walks is encouraged, but avoid strenuous exercise. Do not jog, run, bicycle, lift weights, or participate in any other exercises unless specifically allowed by your doctor. Avoid prolonged sitting, including car rides. ? ?Talk to your doctor before resuming sexual activity. ? ?You should not drive until cleared by your doctor. ? ?Until released by your doctor, you should not return to work or school.  You should rest at home and let your body heal.  ? ?You may shower two days after your surgery.  After showering, lightly dab your incision dry. Do not take a tub bath or go swimming for 3 weeks, or until approved by your doctor at your follow-up appointment. ? ?If you smoke, we strongly recommend that you quit.  Smoking has been proven to interfere with normal healing in your back and will dramatically reduce the success rate of your surgery. Please contact [facility] (800-QUIT-NOW) and use the resources at www.[facility].com for assistance in stopping smoking. ? ?Surgical  Incision ?  ?If you have a dressing on your incision, you may remove it three days after your surgery. Keep your incision area clean and dry. ? ?If you have staples or stitches on your incision, you should have a follow up scheduled for removal.  ? ?Diet          ? ? You may return to your usual diet. Be sure to stay hydrated. ? ?When to Contact us ? ?Although your surgery and recovery will likely be uneventful, you may have some residual numbness, aches, and pains in your back and/or legs. This is normal and should improve in the next few weeks. ? ?However, should you experience any of the following, contact us immediately: ?New numbness or weakness ?Pain that is progressively getting worse, and is not relieved by your pain medications or rest ?Bleeding, redness, swelling, pain, or drainage from surgical incision ?Chills or flu-like symptoms ?Fever greater than 101.0 F (38.3 C) ?Problems with bowel or bladder functions ?Difficulty breathing or shortness of breath ?Warmth, tenderness, or swelling in your calf ? ?Contact Information ?During office hours (Monday-Friday 9 am to 5 pm), please call your physician at [phone] ?After hours and weekends, please call [phone] and speak with the answering service, who will contact the doctor on call.  If that fails, call the [place] Operator at [phone] and ask for the Neurosurgery Resident On Call  ?For a life-threatening emergency, call 911  ?

## 2021-09-16 NOTE — Anesthesia Preprocedure Evaluation (Signed)
Anesthesia Evaluation  ?Patient identified by MRN, date of birth, ID band ?Patient awake ? ? ? ?Reviewed: ?Allergy & Precautions, NPO status , Patient's Chart, lab work & pertinent test results ? ?History of Anesthesia Complications ?Negative for: history of anesthetic complications ? ?Airway ?Mallampati: II ? ?TM Distance: >3 FB ?Neck ROM: Full ? ? ? Dental ?no notable dental hx. ?(+) Teeth Intact ?  ?Pulmonary ?asthma , sleep apnea , COPD,  COPD inhaler, Current SmokerPatient did not abstain from smoking.,  ?  ?Pulmonary exam normal ?breath sounds clear to auscultation ? ? ? ? ? ? Cardiovascular ?Exercise Tolerance: Good ?METShypertension, (-) CAD and (-) Past MI (-) dysrhythmias  ?Rhythm:Regular Rate:Normal ?- Systolic murmurs ? ?  ?Neuro/Psych ?negative neurological ROS ? negative psych ROS  ? GI/Hepatic ?GERD  Medicated,(+)  ?  ? (-) substance abuse ? ,   ?Endo/Other  ?neg diabetes ? Renal/GU ?negative Renal ROS  ? ?  ?Musculoskeletal ? ?(+) Arthritis ,  ? Abdominal ?  ?Peds ? Hematology ?  ?Anesthesia Other Findings ?Past Medical History: ?No date: Asthma ?No date: COPD (chronic obstructive pulmonary disease) ([place]) ?No date: GERD (gastroesophageal reflux disease) ?No date: Gout ?No date: Hypertension ?No date: Sleep apnea ?No date: Umbilical hernia without obstruction and without gangrene ? Reproductive/Obstetrics ? ?  ? ? ? ? ? ? ? ? ? ? ? ? ? ?  ?  ? ? ? ? ? ? ? ? ?Anesthesia Physical ?Anesthesia Plan ? ?ASA: 2 ? ?Anesthesia Plan: General  ? ?Post-op Pain Management: Ofirmev IV (intra-op)* and Dilaudid IV  ? ?Induction: Intravenous ? ?PONV Risk Score and Plan: 2 and Ondansetron, Dexamethasone and Midazolam ? ?Airway Management Planned: Oral ETT ? ?Additional Equipment: None ? ?Intra-op Plan:  ? ?Post-operative Plan: Extubation in OR ? ?Informed Consent: I have reviewed the patients History and Physical, chart, labs and discussed the procedure including the risks, benefits  and alternatives for the proposed anesthesia with the patient or authorized representative who has indicated his/her understanding and acceptance.  ? ? ? ?Dental advisory given ? ?Plan Discussed with: CRNA and Surgeon ? ?Anesthesia Plan Comments: (Discussed risks of anesthesia with patient, including PONV, sore throat, lip/dental/eye damage. Rare risks discussed as well, such as cardiorespiratory and neurological sequelae, and allergic reactions. Discussed the role of CRNA in patient's perioperative care. Patient understands. ?Patient counseled on benefits of smoking cessation, and increased perioperative risks associated with continued smoking. ?)  ? ? ? ? ? ? ?Anesthesia Quick Evaluation ? ?

## 2021-09-16 NOTE — Transfer of Care (Signed)
Immediate Anesthesia Transfer of Care Note ? ?Patient: Albert Hill ? ?Procedure(s) Performed: LEFT L5-S1 MICRODISCECTOMY (Left) ? ?Patient Location: PACU ? ?Anesthesia Type:General ? ?Level of Consciousness: sedated ? ?Airway & Oxygen Therapy: Patient Spontanous Breathing and Patient connected to face mask oxygen ? ?Post-op Assessment: Report given to RN and Post -op Vital signs reviewed and stable ? ?Post vital signs: Reviewed ? ?Last Vitals:  ?Vitals Value Taken Time  ?BP    ?Temp    ?Pulse 81 09/16/21 1708  ?Resp 9 09/16/21 1708  ?SpO2 100 % 09/16/21 1708  ?Vitals shown include unvalidated device data. ? ?Last Pain:  ?   ? ?  ? ?Complications: No notable events documented. ?

## 2021-09-16 NOTE — H&P (Signed)
I have reviewed and confirmed my history and physical from 09/08/2021 with no additions or changes. Plan for L L5-S1 microdiscectomy.  Risks and benefits reviewed. ? ?Heart sounds normal no MRG. Chest Clear to Auscultation Bilaterally. ? ? ?  ? ?

## 2021-09-16 NOTE — Anesthesia Procedure Notes (Signed)
Procedure Name: Intubation ?Date/Time: 09/16/2021 3:36 PM ?Performed by: Rolla Plate, CRNA ?Pre-anesthesia Checklist: Patient identified, Patient being monitored, Timeout performed, Emergency Drugs available and Suction available ?Patient Re-evaluated:Patient Re-evaluated prior to induction ?Oxygen Delivery Method: Circle system utilized ?Preoxygenation: Pre-oxygenation with 100% oxygen ?Induction Type: IV induction ?Ventilation: Mask ventilation without difficulty ?Laryngoscope Size: McGraph and 4 ?Grade View: Grade I ?Tube type: Oral ?Tube size: 7.0 mm ?Number of attempts: 1 ?Airway Equipment and Method: Stylet and Video-laryngoscopy ?Placement Confirmation: ETT inserted through vocal cords under direct vision, positive ETCO2 and breath sounds checked- equal and bilateral ?Secured at: 23 cm ?Tube secured with: Tape ?Dental Injury: Teeth and Oropharynx as per pre-operative assessment  ? ? ? ? ?

## 2021-09-17 DIAGNOSIS — M5416 Radiculopathy, lumbar region: Secondary | ICD-10-CM | POA: Diagnosis not present

## 2021-09-17 MED ORDER — OXYCODONE-ACETAMINOPHEN 5-325 MG PO TABS: 1.0000 | ORAL_TABLET | ORAL | 0 refills | Status: DC | PRN

## 2021-09-17 MED ORDER — METHOCARBAMOL 500 MG PO TABS: 500.0000 mg | ORAL_TABLET | Freq: Four times a day (QID) | ORAL | 0 refills | Status: AC | PRN

## 2021-09-17 MED ORDER — DOCUSATE SODIUM 100 MG PO CAPS: 100.0000 mg | ORAL_CAPSULE | Freq: Two times a day (BID) | ORAL | 0 refills | Status: DC

## 2021-09-17 NOTE — Plan of Care (Signed)

## 2021-09-17 NOTE — Progress Notes (Signed)
Attending Progress Note ? ?History: Albert Hill is here for progressive lower extremity weakness and was found to have a large disc herniation. Intraoperatively was noticed to be transdural. Leak repaired primarily.  ? ?Subjective/IE: NO acute events overnight. Vitals stable. Doing well other wise. Flat until 12pm.  ? ?Physical Exam: ?Vitals:  ? 09/17/21 0700 09/17/21 0822  ?BP: (!) 144/96 (!) 128/103  ?Pulse: 100 91  ?Resp: 17 15  ?Temp: 98 ?F (36.7 ?C) 98.3 ?F (36.8 ?C)  ?SpO2: 97% 96%  ? ? ?AA Ox3 ?CNI ? ?Good strength throughout. Improved LE weakness on exam.  ? ?Incision CDI with no fluid collection   ? ?Data: ? ?No results for input(s): NA, K, CL, CO2, BUN, CREATININE, LABGLOM, GLUCOSE, CALCIUM in the last 168 hours. ?No results for input(s): AST, ALT, ALKPHOS in the last 168 hours. ? ?Invalid input(s): TBILI  ? No results for input(s): WBC, HGB, HCT, PLT in the last 168 hours. ?No results for input(s): APTT, INR in the last 168 hours.  ?   ? ? ?Other tests/results: No new tests at this time ? ?Assessment/Plan: ? ?Albert Hill is a 46 year old man with a history of large disc herniation. The herniation was transdural and resulted in a CSF leak. He's been primarily repaired and has been flat. Will liberalize at 12pm today ? ?- mobilize at 12pm, if doing well okay for DC.  ?- pain control ?- DVT prophylaxis if remains inpatient.  ?- PTOT ? ?Stevan Born, MD/MSCR ?Department of Neurosurgery ? ? ? ?

## 2021-09-19 NOTE — Anesthesia Postprocedure Evaluation (Signed)
Anesthesia Post Note ? ?Patient: FAOLAN SPRINGFIELD ? ?Procedure(s) Performed: LEFT L5-S1 MICRODISCECTOMY (Left) ? ?Patient location during evaluation: PACU ?Anesthesia Type: General ?Level of consciousness: awake and alert ?Pain management: pain level controlled ?Vital Signs Assessment: post-procedure vital signs reviewed and stable ?Respiratory status: spontaneous breathing, nonlabored ventilation, respiratory function stable and patient connected to nasal cannula oxygen ?Cardiovascular status: blood pressure returned to baseline and stable ?Postop Assessment: no apparent nausea or vomiting ?Anesthetic complications: no ? ?No notable events documented. ? ? ?Last Vitals:  ?Vitals:  ? 09/17/21 0822 09/17/21 1200  ?BP: (!) 128/103 (!) 154/110  ?Pulse: 91 90  ?Resp: 15 18  ?Temp: 36.8 ?C 36.7 ?C  ?SpO2: 96% 98%  ?  ?Last Pain:  ?Vitals:  ? 09/17/21 1200  ?TempSrc: Oral  ?PainSc: 5   ? ? ?  ?  ?  ?  ?  ?  ? ?Arita Miss ? ? ? ? ?

## 2021-09-29 NOTE — Discharge Summary (Signed)
Physician Discharge Summary  ?Patient ID: ?Albert Hill ?MRN: [id number] ?DOB/AGE: 48/10/1958 48 y.o. ? ?Admit date: 09/28/2021 ?Discharge date: 09/29/2021 ? ?Admission Diagnoses: ?Lumbar radiculopathy M54.16, Left leg weakness R29.898 ? ?Discharge Diagnoses:  ?Principal Problem: ?  Cervical radiculopathy ? ? ?Discharged Condition: good ? ?Hospital Course:  ?Albert Hill is a 46 y.o s/p Left L5/S1 microdiscectomy. His interoperative course was complicated by a CSF leak that was repair primarily. He was admitted overnight for monitoring. He denied any headaches and his incision remained stable. He was discharged home on POD1.  ? ?Consults: None ? ?Significant Diagnostic Studies: none  ? ?Treatments: surgery: as above. Please see separately dictated operative report for further details  ? ?Discharge Exam: ?Blood pressure 102/66, pulse 60, temperature 98 ?F (36.7 ?C), resp. rate 16, height '5\' 8"'$  (1.727 m), weight 88 kg, SpO2 96 %. ? ?(Completed by Dr. Tyler Pita on 09/17/21) ?AA Ox3 ?CNI ?  ?Good strength throughout. Improved LE weakness on exam.  ?  ?Incision CDI with no fluid collection   ? ?Disposition:  ? ?Discharge Instructions   ? ? Incentive spirometry RT   Complete by: As directed ?  ? ?  ? ?Allergies as of 09/29/2021   ? ?   Reactions  ? Ceftin [cefuroxime Axetil] Other (See Comments)  ? Reaction: Face hot and redness. Lips and ears were also red and on fire  ? Penicillins Hives, Other (See Comments)  ? Has patient had a PCN reaction causing immediate rash, facial/tongue/throat swelling, SOB or lightheadedness with hypotension: No ?Has patient had a PCN reaction causing severe rash involving mucus membranes or skin necrosis: No ?Has patient had a PCN reaction that required hospitalization No ?Has patient had a PCN reaction occurring within the last 10 years: No ?If all of the above answers are "NO", then may proceed with Cephalosporin use.  ? Hydrocodone-acetaminophen   ? Due to the tylenol and his kidneys   ? Tramadol   ? Does not work on him  ? ?  ? ? ? ?Signed: ?Loleta Dicker ?09/29/2021, 10:09 AM ? ? ?

## 2021-10-07 ENCOUNTER — Ambulatory Visit: Payer: BC Managed Care – PPO | Admitting: Internal Medicine

## 2021-12-29 ENCOUNTER — Ambulatory Visit: Payer: BC Managed Care – PPO | Admitting: Neurosurgery

## 2021-12-29 VITALS — BP 153/97 | HR 91 | Ht 70.0 in | Wt 158.4 lb

## 2021-12-29 DIAGNOSIS — M5416 Radiculopathy, lumbar region: Secondary | ICD-10-CM

## 2021-12-29 NOTE — Progress Notes (Signed)
DOS: 09/16/21 left L5-S1 microdiscectomy  HISTORY OF PRESENT ILLNESS: Albert Hill is status post lumbar discectomy. he is doing relatively well. He continues to have occasional symptoms. Overall, his symptoms are much better than they were prior to surgery.   PHYSICAL EXAMINATION:  Vitals:   12/29/21 1438  BP: (!) 153/97  Pulse: 91    General: Patient is well developed, well nourished, calm, collected, and in no apparent distress.  NEUROLOGICAL:  General: In no acute distress.  Awake, alert, oriented to person, place, and time. Pupils equal round and reactive to light.   Strength:  Side Iliopsoas Quads Hamstring PF DF EHL  R '5 5 5 5 5 5  '$ L '5 5 5 5 5 5   '$ Incision c/d/i  ROS (Neurologic): Negative except as noted above  IMAGING: No interval imaging to review   ASSESSMENT/PLAN:  Albert Hill is doing well after lumbar microdiscectomy.  Do not think he is quite ready to return to work.  I will keep him out for an additional 6 weeks.  We will touch base with him in late August to see how he is doing and determine whether he is ready to return to work.    Meade Maw MD, [facility] Department of Neurosurgery

## 2022-01-04 ENCOUNTER — Other Ambulatory Visit: Payer: Self-pay

## 2022-01-04 ENCOUNTER — Ambulatory Visit
Admission: EM | Admit: 2022-01-04 | Discharge: 2022-01-04 | Disposition: A | Discharge status: Home / Self Care | Payer: BC Managed Care – PPO

## 2022-01-04 ENCOUNTER — Emergency Department
Admission: EM | Admit: 2022-01-04 | Discharge: 2022-01-04 | Disposition: A | Discharge status: Home / Self Care | Payer: BC Managed Care – PPO | Attending: Student in an Organized Health Care Education/Training Program | Admitting: Student in an Organized Health Care Education/Training Program

## 2022-01-04 DIAGNOSIS — R110 Nausea: Secondary | ICD-10-CM | POA: Insufficient documentation

## 2022-01-04 DIAGNOSIS — R531 Weakness: Secondary | ICD-10-CM | POA: Insufficient documentation

## 2022-01-04 DIAGNOSIS — R5383 Other fatigue: Secondary | ICD-10-CM | POA: Diagnosis not present

## 2022-01-04 LAB — COMPREHENSIVE METABOLIC PANEL
ALT: 14 U/L (ref 0–44)
AST: 14 U/L — ABNORMAL LOW (ref 15–41)
Albumin: 4.6 g/dL (ref 3.5–5.0)
Alkaline Phosphatase: 97 U/L (ref 38–126)
Anion gap: 8 (ref 5–15)
BUN: 16 mg/dL (ref 6–20)
CO2: 27 mmol/L (ref 22–32)
Calcium: 9.5 mg/dL (ref 8.9–10.3)
Chloride: 104 mmol/L (ref 98–111)
Creatinine, Ser: 0.8 mg/dL (ref 0.61–1.24)
GFR, Estimated: >60 mL/min (ref >60)
Glucose, Bld: 117 mg/dL — ABNORMAL HIGH (ref 70–99)
Potassium: 4.2 mmol/L (ref 3.5–5.1)
Sodium: 139 mmol/L (ref 135–145)
Total Bilirubin: 0.6 mg/dL (ref 0.3–1.2)
Total Protein: 7.7 g/dL (ref 6.5–8.1)

## 2022-01-04 LAB — CBC
HCT: 42.9 % (ref 39.0–52.0)
Hemoglobin: 14.8 g/dL (ref 13.0–17.0)
MCH: 30.5 pg (ref 26.0–34.0)
MCHC: 34.5 g/dL (ref 30.0–36.0)
MCV: 88.5 fL (ref 80.0–100.0)
Platelets: 306 10*3/uL (ref 150–400)
RBC: 4.85 MIL/uL (ref 4.22–5.81)
RDW: 13 % (ref 11.5–15.5)
WBC: 10.8 10*3/uL — ABNORMAL HIGH (ref 4.0–10.5)
nRBC: 0 % (ref 0.0–0.2)

## 2022-01-04 LAB — URINALYSIS, ROUTINE W REFLEX MICROSCOPIC
Bilirubin Urine: NEGATIVE
Glucose, UA: NEGATIVE mg/dL
Hgb urine dipstick: NEGATIVE
Ketones, ur: NEGATIVE mg/dL
Leukocytes,Ua: NEGATIVE
Nitrite: NEGATIVE
Protein, ur: NEGATIVE mg/dL
Specific Gravity, Urine: 1.016 (ref 1.005–1.030)
pH: 5 (ref 5.0–8.0)

## 2022-01-04 LAB — LIPASE, BLOOD: Lipase: 23 U/L (ref 11–51)

## 2022-01-04 LAB — TROPONIN I (HIGH SENSITIVITY): Troponin I (High Sensitivity): 4 ng/L (ref <18)

## 2022-01-04 MED ORDER — ALUM & MAG HYDROXIDE-SIMETH 200-200-20 MG/5ML PO SUSP
30.0000 mL | Freq: Once | ORAL | Status: AC
  Administered 2022-01-04: 30 mL via ORAL
  Filled 2022-01-04: qty 30

## 2022-01-04 NOTE — ED Provider Notes (Signed)
[facility] Provider Note    Event Date/Time   First MD Initiated Contact with Patient 01/04/22 1343     (approximate)   History   Weakness and Nausea   HPI  Albert Hill is a 46 y.o. male presents to the ER for evaluation of several weeks to months of nausea and over the past week or 2 has been feeling fatigued and weak.  And was worried that he might of overdone done out working outside and got dehydrated.  He denies any pain at this time.  States he has chronic reflux has been compliant with his medications.  He is also concerned that he is got a new hernia has a history of an umbilical hernia which was repaired but is noticed and bulging in the left groin over the past few weeks.  He denies any chest pain or pressure no shortness of breath.     Physical Exam   Triage Vital Signs: ED Triage Vitals  Enc Vitals Group     BP 01/04/22 1204 (!) 114/97     Pulse Rate 01/04/22 1204 (!) 102     Resp 01/04/22 1204 20     Temp 01/04/22 1204 98.3 F (36.8 C)     Temp Source 01/04/22 1204 Oral     SpO2 01/04/22 1204 98 %     Weight 01/04/22 1205 158 lb 4.6 oz (71.8 kg)     Height 01/04/22 1205 '5\' 10"'$  (1.778 m)     Head Circumference --      Peak Flow --      Pain Score 01/04/22 1205 7     Pain Loc --      Pain Edu? --      Excl. in [place]? --     Most recent vital signs: Vitals:   01/04/22 1403 01/04/22 1430  BP: 120/89 (!) 137/97  Pulse: 92 83  Resp: 14 16  Temp:    SpO2: 95% 95%     Constitutional: Alert  Eyes: Conjunctivae are normal.  Head: Atraumatic. Nose: No congestion/rhinnorhea. Mouth/Throat: Mucous membranes are moist.   Neck: Painless ROM.  Cardiovascular:   Good peripheral circulation. Respiratory: Normal respiratory effort.  No retractions.  Gastrointestinal: Soft and nontender.  No guarding or rebound.  Does have soft bulge to the left inguinal canal which is easily reducible and nontender not consistent with obstructive  or incarcerated hernia. Musculoskeletal:  no deformity Neurologic:  MAE spontaneously. No gross focal neurologic deficits are appreciated.  Skin:  Skin is warm, dry and intact. No rash noted. Psychiatric: Mood and affect are normal. Speech and behavior are normal.    ED Results / Procedures / Treatments   Labs (all labs ordered are listed, but only abnormal results are displayed) Labs Reviewed  COMPREHENSIVE METABOLIC PANEL - Abnormal; Notable for the following components:      Result Value   Glucose, Bld 117 (*)    AST 14 (*)    All other components within normal limits  CBC - Abnormal; Notable for the following components:   WBC 10.8 (*)    All other components within normal limits  URINALYSIS, ROUTINE W REFLEX MICROSCOPIC - Abnormal; Notable for the following components:   Color, Urine YELLOW (*)    APPearance HAZY (*)    All other components within normal limits  LIPASE, BLOOD  TROPONIN I (HIGH SENSITIVITY)     EKG  ED ECG REPORT I, Merlyn Lot, the attending physician, personally viewed and  interpreted this ECG.   Date: 01/04/2022  EKG Time: 14:01  Rate: 95  Rhythm: sinus  Axis: normal  Intervals:normal  ST&T Change: no stemi, no depressions    RADIOLOGY    PROCEDURES:  Critical Care performed: No  Procedures   MEDICATIONS ORDERED IN ED: Medications  alum & mag hydroxide-simeth (MAALOX/MYLANTA) 200-200-20 MG/5ML suspension 30 mL (30 mLs Oral Given 01/04/22 1458)     IMPRESSION / MDM / ASSESSMENT AND PLAN / ED COURSE  I reviewed the triage vital signs and the nursing notes.                              Differential diagnosis includes, but is not limited to, dehydration, electrolyte abnormality, hernia, obstruction, electrolyte abnormality,  Patient presented to ER for evaluation of symptoms as described above.  He is nontoxic-appearing symptoms been ongoing for quite some time.  His exam as above.  Blood work sent for the blood differential  appears at his baseline and stable.  EKG is nonischemic troponin negative.  Lung sounds are clear bilaterally.  Reviewed his previous imaging and there was concern for possible findings of early cirrhosis.  I recommended ultrasound of the abdomen to further evaluate the patient states he would prefer to have this done as an outpatient.  Given the duration of symptoms I think that outpatient work-up is appropriate.  Patient does appear stable and appropriate for follow-up      FINAL CLINICAL IMPRESSION(S) / ED DIAGNOSES   Final diagnoses:  Nausea     Rx / DC Orders   ED Discharge Orders     None        Note:  This document was prepared using Dragon voice recognition software and may include unintentional dictation errors.    Merlyn Lot, MD 01/04/22 [phone]

## 2022-01-04 NOTE — ED Triage Notes (Signed)
Pt here with nausea and weakness. Pt had back surgery in March and has been having issues since. Pt states since Tuesday he has been "hot and nauseous" and has been trying to increase his fluids but states it has not helped. Pt states his symptoms are similar to when he had a hernia in 2020.

## 2022-01-18 ENCOUNTER — Other Ambulatory Visit: Payer: Self-pay

## 2022-01-18 ENCOUNTER — Ambulatory Visit (INDEPENDENT_AMBULATORY_CARE_PROVIDER_SITE_OTHER): Payer: BC Managed Care – PPO | Admitting: Surgery

## 2022-01-18 ENCOUNTER — Encounter: Payer: Self-pay | Admitting: Surgery

## 2022-01-18 VITALS — BP 145/103 | HR 113 | Temp 97.7°F | Ht 70.0 in | Wt 148.6 lb

## 2022-01-18 DIAGNOSIS — K5900 Constipation, unspecified: Secondary | ICD-10-CM | POA: Diagnosis not present

## 2022-01-18 DIAGNOSIS — R1084 Generalized abdominal pain: Secondary | ICD-10-CM | POA: Diagnosis not present

## 2022-01-18 DIAGNOSIS — K4090 Unilateral inguinal hernia, without obstruction or gangrene, not specified as recurrent: Secondary | ICD-10-CM | POA: Diagnosis not present

## 2022-01-18 DIAGNOSIS — R112 Nausea with vomiting, unspecified: Secondary | ICD-10-CM | POA: Diagnosis not present

## 2022-01-18 NOTE — Patient Instructions (Signed)
Referral to [facility]. Someone from their office will call to schedule an appointment. If you do not hear from their office within a few days please call [phone].   Our surgery scheduler will call you within 24-48 hours to schedule surgery. Please have the [facility] surgery sheet available when speaking with her.    Inguinal Hernia, Adult An inguinal hernia develops when fat or the intestines push through a weak spot in a muscle where the leg meets the lower abdomen (groin). This creates a bulge. This kind of hernia could also be: In the scrotum, if you are male. In folds of skin around the vagina, if you are male. There are three types of inguinal hernias: Hernias that can be pushed back into the abdomen (are reducible). This type rarely causes pain. Hernias that are not reducible (are incarcerated). Hernias that are not reducible and lose their blood supply (are strangulated). This type of hernia requires emergency surgery. What are the causes? This condition is caused by having a weak spot in the muscles or tissues in your groin. This develops over time. The hernia may poke through the weak spot when you suddenly strain your lower abdominal muscles, such as when you: Lift a heavy object. Strain to have a bowel movement. Constipation can lead to straining. Cough. What increases the risk? This condition is more likely to develop in: Males. Pregnant females. People who: Are overweight. Work in jobs that require long periods of standing or heavy lifting. Have had an inguinal hernia before. Smoke or have lung disease. These factors can lead to long-term (chronic) coughing. What are the signs or symptoms? Symptoms may depend on the size of the hernia. Often, a small inguinal hernia has no symptoms. Symptoms of a larger hernia may include: A bulge in the groin area. This is easier to see when standing. It might not be visible when lying down. Pain or burning in the groin.  This may get worse when lifting, straining, or coughing. A dull ache or a feeling of pressure in the groin. An unusual bulge in the scrotum, in males. Symptoms of a strangulated inguinal hernia may include: A bulge in your groin that is very painful and tender to the touch. A bulge that turns red or purple. Fever, nausea, and vomiting. Inability to have a bowel movement or to pass gas. How is this diagnosed? This condition is diagnosed based on your symptoms, your medical history, and a physical exam. Your health care provider may feel your groin area and ask you to cough. How is this treated? Treatment depends on the size of your hernia and whether you have symptoms. If you do not have symptoms, your health care provider may have you watch your hernia carefully and have you come in for follow-up visits. If your hernia is large or if you have symptoms, you may need surgery to repair the hernia. Follow these instructions at home: Lifestyle Avoid lifting heavy objects. Avoid standing for long periods of time. Do not use any products that contain nicotine or tobacco. These products include cigarettes, chewing tobacco, and vaping devices, such as e-cigarettes. If you need help quitting, ask your health care provider. Maintain a healthy weight. Preventing constipation You may need to take these actions to prevent or treat constipation: Drink enough fluid to keep your urine pale yellow. Take over-the-counter or prescription medicines. Eat foods that are high in fiber, such as beans, whole grains, and fresh fruits and vegetables. Limit foods that are high in  fat and processed sugars, such as fried or sweet foods. General instructions You may try to push the hernia back in place by very gently pressing on it while lying down. Do not try to force the bulge back in if it will not push in easily. Watch your hernia for any changes in shape, size, or color. Get help right away if you notice any  changes. Take over-the-counter and prescription medicines only as told by your health care provider. Keep all follow-up visits. This is important. Contact a health care provider if: You have a fever or chills. You develop new symptoms. Your symptoms get worse. Get help right away if: You have pain in your groin that suddenly gets worse. You have a bulge in your groin that: Suddenly gets bigger and does not get smaller. Becomes red or purple or painful to the touch. You are a man and you have a sudden pain in your scrotum, or the size of your scrotum suddenly changes. You cannot push the hernia back in place by very gently pressing on it when you are lying down. You have nausea or vomiting that does not go away. You have a fast heartbeat. You cannot have a bowel movement or pass gas. These symptoms may represent a serious problem that is an emergency. Do not wait to see if the symptoms will go away. Get medical help right away. Call your local emergency services (911 in the [place] Summary An inguinal hernia develops when fat or the intestines push through a weak spot in a muscle where your leg meets your lower abdomen (groin). This condition is caused by having a weak spot in muscles or tissues in your groin. Symptoms may depend on the size of the hernia, and they may include pain or swelling in your groin. A small inguinal hernia often has no symptoms. Treatment may not be needed if you do not have symptoms. If you have symptoms or a large hernia, you may need surgery to repair the hernia. Avoid lifting heavy objects. Also, avoid standing for long periods of time. This information is not intended to replace advice given to you by your health care provider. Make sure you discuss any questions you have with your health care provider. Document Revised: 02/03/2020 Document Reviewed: 02/03/2020 Elsevier Patient Education  [place].

## 2022-01-18 NOTE — H&P (View-Only) (Signed)
01/18/2022  Reason for Visit:  Left inguinal hernia  History of Present Illness: Albert Hill is a 45 y.o. male presenting for evaluation of a left inguinal hernia and GI issues.  The patient presented to the ED on 01/04/22 with complaints of nausea and vomiting episodes for the past few months.  He reports that also more recently he noticed a lump in his left groin after doing some heavy activity outdoors.  He reports issues with constipation, abdominal cramping, some nausea/emesis, burping.  He feels that he is losing weight because of it.  He also reports some intermittent discomfort in the left groin, particularly if he's lifting something heavy, and the discomfort sometimes is burning/shooting pain towards the testicle.  He has been seen by GI with [facility] for his GI symptoms, but feels that they have not been able to address his issues.  He has an order for a gastric emptying study ordered in January 2023, but he reports he was not told of this and was not scheduled.    He has a history of open umbilical hernia repair with mesh with Dr. Davis on 07/26/2018, and reports that he had some similar symptoms before that surgery.  He also reports that last year his family got sick and they all recovered except him and he's had these lingering GI symptoms.    In the ED, he was diagnosed with a left inguinal hernia and was referred for further evaluation.  He denies any current issues with the right groin, but reports sensitivity around the umbilicus.  Of note, he's currently not working after having had back surgery with Dr. Yarbrough on 09/16/21.  He would be released to go back to work on 02/10/22.  Past Medical History: Past Medical History:  Diagnosis Date   Asthma    COPD (chronic obstructive pulmonary disease) (HCC)    GERD (gastroesophageal reflux disease)    Gout    Hypertension    Sleep apnea    Umbilical hernia without obstruction and without gangrene      Past Surgical  History: Past Surgical History:  Procedure Laterality Date   CYST EXCISION     Patient states he was 46 years old, on his back   LUMBAR LAMINECTOMY/DECOMPRESSION MICRODISCECTOMY Left 09/16/2021   Procedure: LEFT L5-S1 MICRODISCECTOMY;  Surgeon: Yarbrough, Chester, MD;  Location: [facility] ORS;  Service: Neurosurgery;  Laterality: Left;   UMBILICAL HERNIA REPAIR N/A 07/26/2018   Procedure: OPEN HERNIA REPAIR UMBILICAL ADULT WITH MESH;  Surgeon: Davis, Jason Evan, MD;  Location: [facility] ORS;  Service: General;  Laterality: N/A;    Home Medications: Prior to Admission medications   Medication Sig Start Date End Date Taking? Authorizing Provider  albuterol (PROVENTIL) (2.5 MG/3ML) 0.083% nebulizer solution Take 2.5 mg by nebulization every 6 (six) hours as needed for shortness of breath or wheezing.   Yes [provider]  Albuterol Sulfate, sensor, (PROAIR DIGIHALER) 108 (90 Base) MCG/ACT AEPB Inhale 2 puffs into the lungs 4 (four) times daily as needed. 08/10/21  Yes Desai, Nikita S, MD  allopurinol (ZYLOPRIM) 300 MG tablet Take 300 mg by mouth daily. 11/04/20  Yes [provider]  lisinopril (ZESTRIL) 20 MG tablet Take 10 mg by mouth daily. 11/21/20  Yes [provider]  Multiple Vitamin (MULTIVITAMIN WITH MINERALS) TABS tablet Take 1 tablet by mouth daily.   Yes [provider]  ondansetron (ZOFRAN-ODT) 8 MG disintegrating tablet Take 1 tablet (8 mg total) by mouth every 8 (eight) hours as needed.   07/18/21  Yes Esterwood, Amy S, PA-C    Allergies: No Known Allergies  Social History:  reports that he has been smoking cigarettes. He has a 60.00 pack-year smoking history. He has been exposed to tobacco smoke. His smokeless tobacco use includes chew. He reports that he does not currently use alcohol. He reports current drug use. Drug: Marijuana.   Family History: Family History  Problem Relation Age of Onset   Lung cancer Father    Asthma Son    Colon cancer Neg Hx     Esophageal cancer Neg Hx    Rectal cancer Neg Hx    Stomach cancer Neg Hx     Review of Systems: Review of Systems  Constitutional:  Negative for chills and fever.  HENT:  Negative for hearing loss.   Respiratory:  Negative for shortness of breath.   Cardiovascular:  Negative for chest pain.  Gastrointestinal:  Positive for abdominal pain, constipation, nausea and vomiting. Negative for diarrhea.  Genitourinary:  Negative for dysuria.  Musculoskeletal:  Negative for myalgias.  Skin:  Negative for rash.  Neurological:  Negative for dizziness.  Psychiatric/Behavioral:  Negative for depression.     Physical Exam BP (!) 145/103   Pulse (!) 113   Temp 97.7 F (36.5 C) (Oral)   Ht 5' 10" (1.778 m)   Wt 148 lb 9.6 oz (67.4 kg)   SpO2 98%   BMI 21.32 kg/m  CONSTITUTIONAL: No acute distress, well nourished. HEENT:  Normocephalic, atraumatic, extraocular motion intact. NECK: Trachea is midline, and there is no jugular venous distension.  RESPIRATORY:  Lungs have some wheezing at the bases, no crackles. Normal respiratory effort without pathologic use of accessory muscles. CARDIOVASCULAR: Heart is regular without murmurs, gallops, or rubs. GI: The abdomen is soft, non-distended, non-tender to palpation.  The patient has a small left inguinal hernia which is reducible and more noticeable when he's standing, otherwise harder to palpate when he's lying down.  No evidence of hernia at the right groin, and no evidence of hernia recurrent at the umbilicus.  MUSCULOSKELETAL:  Normal muscle strength and tone in all four extremities.  No peripheral edema or cyanosis. SKIN: Skin turgor is normal. There are no pathologic skin lesions.  NEUROLOGIC:  Motor and sensation is grossly normal.  Cranial nerves are grossly intact. PSYCH:  Alert and oriented to person, place and time. Affect is normal.  Laboratory Analysis: Labs from 01/04/22: Na 139, K 4.2, Cl 104, CO2 27, BUN 16, Cr 0.8.  Total bili  0.6, AST 14, ALT 14, Alk Phos 97, lipase 23, albumin 4.6.  WBC 10.8, Hgb 14.8, Hct 42.9, Plt 306  Imaging: CT abdomen/pelvis 02/28/21: IMPRESSION: 1. No acute findings in the abdomen or pelvis. 2. Widening of the hepatic fissures with enlargement of the lateral left lobe of the liver. Findings can be seen in the setting of early cirrhosis. 3. 4 mm pulmonary nodules in the bilateral lower lobes. No follow-up needed if patient is low-risk (and has no known or suspected primary neoplasm). Non-contrast chest CT can be considered in 12 months if patient is high-risk. This recommendation follows the consensus statement: Guidelines for Management of Incidental Pulmonary Nodules Detected on CT Images: From the Fleischner Society 2017; Radiology 2017; 284:228-243. Acute 4. Aortic Atherosclerosis (ICD10-I70.0) and Emphysema (ICD10-J43.9).    Assessment and Plan: This is a 45 y.o. male with a left inguinal hernia.  --Discussed with the patient that I do not think the left inguinal hernia would be the cause   for his other GI symptoms.  His hernia is small and no bowel runs through it, so would be very difficult to cause constipation, obstruction or nausea/vomiting.  However, his hernia is symptomatic in that it causes discomfort in the left groin and some burning/shooting pain sensation to the groin/testicle.   --For the GI symptoms, we can send a referral to [facility] for further evaluation.  He's in agreement, as he is not too pleased with the team he saw before.  For the hernia, discussed that we can offer a robotic assisted left inguinal hernia repair.  We would be able to look at the right groin and repair if there's a hernia there.  Discussed with him the surgery at length, including the incisions, the risks of bleeding, infection, injury to surrounding structures, that this would be an outpatient surgery, post-operative activity restriction, pain control, and he's willing to proceed. --Will tentatively  schedule him for surgery on 01/31/22.  He's unsure of timing of surgery vs GI appointment and may postpone surgery if GI can see him sooner.  I spent 60 minutes dedicated to the care of this patient on the date of this encounter to include pre-visit review of records, face-to-face time with the patient discussing diagnosis and management, and any post-visit coordination of care.   Adilynne Fitzwater Luis Eyana Stolze, MD [facility]    

## 2022-01-18 NOTE — Progress Notes (Signed)
01/18/2022  Reason for Visit:  Left inguinal hernia  History of Present Illness: Albert Hill is a 46 y.o. male presenting for evaluation of a left inguinal hernia and GI issues.  The patient presented to the ED on 01/04/22 with complaints of nausea and vomiting episodes for the past few months.  He reports that also more recently he noticed a lump in his left groin after doing some heavy activity outdoors.  He reports issues with constipation, abdominal cramping, some nausea/emesis, burping.  He feels that he is losing weight because of it.  He also reports some intermittent discomfort in the left groin, particularly if he's lifting something heavy, and the discomfort sometimes is burning/shooting pain towards the testicle.  He has been seen by GI with [facility] for his GI symptoms, but feels that they have not been able to address his issues.  He has an order for a gastric emptying study ordered in January 2023, but he reports he was not told of this and was not scheduled.    He has a history of open umbilical hernia repair with mesh with Dr. Rosana Hoes on 07/26/2018, and reports that he had some similar symptoms before that surgery.  He also reports that last year his family got sick and they all recovered except him and he's had these lingering GI symptoms.    In the ED, he was diagnosed with a left inguinal hernia and was referred for further evaluation.  He denies any current issues with the right groin, but reports sensitivity around the umbilicus.  Of note, he's currently not working after having had back surgery with Dr. Izora Ribas on 09/16/21.  He would be released to go back to work on 02/10/22.  Past Medical History: Past Medical History:  Diagnosis Date   Asthma    COPD (chronic obstructive pulmonary disease) (HCC)    GERD (gastroesophageal reflux disease)    Gout    Hypertension    Sleep apnea    Umbilical hernia without obstruction and without gangrene      Past Surgical  History: Past Surgical History:  Procedure Laterality Date   CYST EXCISION     Patient states he was 46 years old, on his back   LUMBAR LAMINECTOMY/DECOMPRESSION MICRODISCECTOMY Left 09/16/2021   Procedure: LEFT L5-S1 MICRODISCECTOMY;  Surgeon: Meade Maw, MD;  Location: [facility] ORS;  Service: Neurosurgery;  Laterality: Left;   UMBILICAL HERNIA REPAIR N/A 07/26/2018   Procedure: OPEN HERNIA REPAIR UMBILICAL ADULT WITH MESH;  Surgeon: Vickie Epley, MD;  Location: [facility] ORS;  Service: General;  Laterality: N/A;    Home Medications: Prior to Admission medications   Medication Sig Start Date End Date Taking? Authorizing Provider  albuterol (PROVENTIL) (2.5 MG/3ML) 0.083% nebulizer solution Take 2.5 mg by nebulization every 6 (six) hours as needed for shortness of breath or wheezing.   Yes [provider]  Albuterol Sulfate, sensor, (PROAIR DIGIHALER) 108 (90 Base) MCG/ACT AEPB Inhale 2 puffs into the lungs 4 (four) times daily as needed. 08/10/21  Yes Spero Geralds, MD  allopurinol (ZYLOPRIM) 300 MG tablet Take 300 mg by mouth daily. 11/04/20  Yes [provider]  lisinopril (ZESTRIL) 20 MG tablet Take 10 mg by mouth daily. 11/21/20  Yes [provider]  Multiple Vitamin (MULTIVITAMIN WITH MINERALS) TABS tablet Take 1 tablet by mouth daily.   Yes [provider]  ondansetron (ZOFRAN-ODT) 8 MG disintegrating tablet Take 1 tablet (8 mg total) by mouth every 8 (eight) hours as needed.  07/18/21  Yes Esterwood, Amy S, PA-C    Allergies: No Known Allergies  Social History:  reports that he has been smoking cigarettes. He has a 60.00 pack-year smoking history. He has been exposed to tobacco smoke. His smokeless tobacco use includes chew. He reports that he does not currently use alcohol. He reports current drug use. Drug: Marijuana.   Family History: Family History  Problem Relation Age of Onset   Lung cancer Father    Asthma Son    Colon cancer Neg Hx     Esophageal cancer Neg Hx    Rectal cancer Neg Hx    Stomach cancer Neg Hx     Review of Systems: Review of Systems  Constitutional:  Negative for chills and fever.  HENT:  Negative for hearing loss.   Respiratory:  Negative for shortness of breath.   Cardiovascular:  Negative for chest pain.  Gastrointestinal:  Positive for abdominal pain, constipation, nausea and vomiting. Negative for diarrhea.  Genitourinary:  Negative for dysuria.  Musculoskeletal:  Negative for myalgias.  Skin:  Negative for rash.  Neurological:  Negative for dizziness.  Psychiatric/Behavioral:  Negative for depression.     Physical Exam BP (!) 145/103   Pulse (!) 113   Temp 97.7 F (36.5 C) (Oral)   Ht 5' 10" (1.778 m)   Wt 148 lb 9.6 oz (67.4 kg)   SpO2 98%   BMI 21.32 kg/m  CONSTITUTIONAL: No acute distress, well nourished. HEENT:  Normocephalic, atraumatic, extraocular motion intact. NECK: Trachea is midline, and there is no jugular venous distension.  RESPIRATORY:  Lungs have some wheezing at the bases, no crackles. Normal respiratory effort without pathologic use of accessory muscles. CARDIOVASCULAR: Heart is regular without murmurs, gallops, or rubs. GI: The abdomen is soft, non-distended, non-tender to palpation.  The patient has a small left inguinal hernia which is reducible and more noticeable when he's standing, otherwise harder to palpate when he's lying down.  No evidence of hernia at the right groin, and no evidence of hernia recurrent at the umbilicus.  MUSCULOSKELETAL:  Normal muscle strength and tone in all four extremities.  No peripheral edema or cyanosis. SKIN: Skin turgor is normal. There are no pathologic skin lesions.  NEUROLOGIC:  Motor and sensation is grossly normal.  Cranial nerves are grossly intact. PSYCH:  Alert and oriented to person, place and time. Affect is normal.  Laboratory Analysis: Labs from 01/04/22: Na 139, K 4.2, Cl 104, CO2 27, BUN 16, Cr 0.8.  Total bili  0.6, AST 14, ALT 14, Alk Phos 97, lipase 23, albumin 4.6.  WBC 10.8, Hgb 14.8, Hct 42.9, Plt 306  Imaging: CT abdomen/pelvis 02/28/21: IMPRESSION: 1. No acute findings in the abdomen or pelvis. 2. Widening of the hepatic fissures with enlargement of the lateral left lobe of the liver. Findings can be seen in the setting of early cirrhosis. 3. 4 mm pulmonary nodules in the bilateral lower lobes. No follow-up needed if patient is low-risk (and has no known or suspected primary neoplasm). Non-contrast chest CT can be considered in 12 months if patient is high-risk. This recommendation follows the consensus statement: Guidelines for Management of Incidental Pulmonary Nodules Detected on CT Images: From the Fleischner Society 2017; Radiology 2017; 284:228-243. Acute 4. Aortic Atherosclerosis (ICD10-I70.0) and Emphysema (ICD10-J43.9).    Assessment and Plan: This is a 46 y.o. male with a left inguinal hernia.  --Discussed with the patient that I do not think the left inguinal hernia would be the cause  for his other GI symptoms.  His hernia is small and no bowel runs through it, so would be very difficult to cause constipation, obstruction or nausea/vomiting.  However, his hernia is symptomatic in that it causes discomfort in the left groin and some burning/shooting pain sensation to the groin/testicle.   --For the GI symptoms, we can send a referral to [facility] for further evaluation.  He's in agreement, as he is not too pleased with the team he saw before.  For the hernia, discussed that we can offer a robotic assisted left inguinal hernia repair.  We would be able to look at the right groin and repair if there's a hernia there.  Discussed with him the surgery at length, including the incisions, the risks of bleeding, infection, injury to surrounding structures, that this would be an outpatient surgery, post-operative activity restriction, pain control, and he's willing to proceed. --Will tentatively  schedule him for surgery on 01/31/22.  He's unsure of timing of surgery vs GI appointment and may postpone surgery if GI can see him sooner.  I spent 60 minutes dedicated to the care of this patient on the date of this encounter to include pre-visit review of records, face-to-face time with the patient discussing diagnosis and management, and any post-visit coordination of care.   Melvyn Neth, [facility]

## 2022-01-19 ENCOUNTER — Telehealth: Payer: Self-pay | Admitting: Surgery

## 2022-01-19 NOTE — Telephone Encounter (Signed)
Outgoing call made, not able to leave message, voice mailbox full.  If patient calls back, please inform him of the following regarding scheduled surgery for robotic left inguinal hernia repair with Dr. Hampton Abbot.   Pre-Admission date/time, and Surgery date.  Surgery Date: 01/31/22 @ [facility] Preadmission Testing Date: 01/24/22 (phone 1p-5p)  Also patient to call at [phone], between 1-3:00pm the day before surgery, to find out what time to arrive for surgery.

## 2022-01-19 NOTE — Telephone Encounter (Signed)
Patient calls back, he is given information regarding his surgery.

## 2022-01-24 ENCOUNTER — Ambulatory Visit (INDEPENDENT_AMBULATORY_CARE_PROVIDER_SITE_OTHER): Payer: BC Managed Care – PPO | Admitting: Gastroenterology

## 2022-01-24 ENCOUNTER — Encounter: Payer: Self-pay | Admitting: Gastroenterology

## 2022-01-24 ENCOUNTER — Inpatient Hospital Stay: Admission: RE | Admit: 2022-01-24 | Payer: BC Managed Care – PPO | Source: Ambulatory Visit

## 2022-01-24 VITALS — BP 129/88 | HR 105 | Temp 98.6°F | Ht 70.0 in | Wt 149.0 lb

## 2022-01-24 DIAGNOSIS — R140 Abdominal distension (gaseous): Secondary | ICD-10-CM

## 2022-01-24 DIAGNOSIS — R112 Nausea with vomiting, unspecified: Secondary | ICD-10-CM

## 2022-01-24 DIAGNOSIS — R634 Abnormal weight loss: Secondary | ICD-10-CM

## 2022-01-24 NOTE — Patient Instructions (Signed)
Gave [employer] samples. TaKE 1 CAPSULE WITH THE FIRST BITE OF EACH MEAL AND 1 CAPSULE WITH THE FIRST BITE OF [facility]

## 2022-01-24 NOTE — Progress Notes (Unsigned)
Cephas Darby, MD [address]  [place], [place] [address]  Main: [phone]  Fax: [phone]    Gastroenterology Consultation  Referring Provider:     Shanon Rosser, PA-C Primary Care Physician:  Shanon Rosser, PA-C Primary Gastroenterologist:  Dr. Cephas Darby Reason for Consultation: Nausea, vomiting abdominal bloating and weight loss        HPI:   Albert Hill is a 46 y.o. male referred by Dr. Shanon Rosser, PA-C  for consultation & management of chronic symptoms of nausea.  Patient had some type of viral infection in June 2022 but COVID was negative.  Followed by that, he developed worsening of nausea associated with upper abdominal discomfort, decreased p.o. intake and weight loss.  He was seen in the ER, found to have mild leukocytosis, normal lipase and normal LFTs.  He was given Phenergan and sucralfate.  He was initially evaluated by Dr. Scar[facility] gastroenterologist.  Underwent upper endoscopy which was normal, biopsies were not performed.  Colonoscopy revealed diverticulosis and subcentimeter tubular adenoma only.  It was thought that his symptoms were secondary to GERD and treated with Aciphex 20 mg daily.  His symptoms worsened with recurrence of nausea and vomiting and he lost additional 30 pounds due to loss of appetite.  He was taking omeprazole from his wife's.  He underwent CT abdomen pelvis with contrast which was unremarkable.  Gastroparesis is also considered along with cannabis hyperemesis.  Patient could not undergo gastric emptying study.  He was recently evaluated by Dr. Hampton Abbot for left inguinal hernia after patient noticed a lump in the left lower quadrant and felt it might be contributing to his symptoms.  Dr. Hampton Abbot referred this patient to GI because the left inguinal hernia was not thought to be the cause for his symptoms.  Patient is frustrated with his ongoing symptoms of nausea, abdominal bloating as well as intermittent  episodes of vomiting and ongoing weight loss.  He felt that he did not receive answers from his previous gastroenterologist.  Therefore, decided to switch to  GI. Overall, patient has lost about 100 pounds  Patient is scheduled to undergo inguinal hernia repair on 01/31/2022  Patient has been smoking marijuana.  He also smokes a pack of cigarettes per day   NSAIDs: None  Antiplts/Anticoagulants/Anti thrombotics: None  GI Procedures:  Upper endoscopy and colonoscopy 02/01/2021 - The esophagus was normal. - The stomach was normal save small hiatal hernia. - The examined duodenum was normal. - The cardia and gastric fundus were normal on retroflexion.  - A 5 mm polyp was found in the sigmoid colon. The polyp was removed with a cold snare. Resection and retrieval were complete. - A 1 mm polyp was found in the sigmoid colon. The polyp was biopsy removed with a cold forceps and submitted for histology. - Scattered medium-mouthed diverticula were found in the entire colon. - The exam was otherwise without abnormality on direct and retroflexion views.  Diagnosis 1. Surgical [P], colon, sigmoid, polyp (1) - HYPERPLASTIC POLYP. - NO ADENOMATOUS CHANGE OR CARCINOMA. 2. Surgical [P], colon, sigmoid, polyp (1) - TUBULAR ADENOMA. - NO HIGH GRADE DYSPLASIA OR CARCINOMA.  Past Medical History:  Diagnosis Date   Asthma    COPD (chronic obstructive pulmonary disease) (HCC)    GERD (gastroesophageal reflux disease)    Gout    Hypertension    Sleep apnea    Umbilical hernia without obstruction and without gangrene  Past Surgical History:  Procedure Laterality Date   CYST EXCISION     Patient states he was 46 years old, on his back   LUMBAR LAMINECTOMY/DECOMPRESSION MICRODISCECTOMY Left 09/16/2021   Procedure: LEFT L5-S1 MICRODISCECTOMY;  Surgeon: Meade Maw, MD;  Location: [facility] ORS;  Service: Neurosurgery;  Laterality: Left;   UMBILICAL HERNIA REPAIR N/A 07/26/2018    Procedure: OPEN HERNIA REPAIR UMBILICAL ADULT WITH MESH;  Surgeon: Vickie Epley, MD;  Location: [facility] ORS;  Service: General;  Laterality: N/A;     Current Outpatient Medications:    albuterol (PROVENTIL) (2.5 MG/3ML) 0.083% nebulizer solution, Take 2.5 mg by nebulization every 6 (six) hours as needed for shortness of breath or wheezing., Disp: , Rfl:    Albuterol Sulfate, sensor, (PROAIR DIGIHALER) 108 (90 Base) MCG/ACT AEPB, Inhale 2 puffs into the lungs 4 (four) times daily as needed., Disp: 1 each, Rfl: 5   allopurinol (ZYLOPRIM) 300 MG tablet, Take 300 mg by mouth daily., Disp: , Rfl:    lisinopril (ZESTRIL) 20 MG tablet, Take 10 mg by mouth daily., Disp: , Rfl:    ondansetron (ZOFRAN-ODT) 8 MG disintegrating tablet, Take 1 tablet (8 mg total) by mouth every 8 (eight) hours as needed., Disp: 50 tablet, Rfl: 4   Multiple Vitamin (MULTIVITAMIN WITH MINERALS) TABS tablet, Take 1 tablet by mouth daily. (Patient not taking: Reported on 01/24/2022), Disp: , Rfl:    omeprazole (PRILOSEC) 20 MG capsule, Take 20 mg by mouth as needed., Disp: , Rfl:    OVER THE COUNTER MEDICATION, , Disp: , Rfl:    Pancrelipase, Lip-Prot-Amyl, (ZENPEP PO), Take 1 capsule by mouth 2 (two) times daily with a meal. Take with meals and snacks, Disp: , Rfl:    Family History  Problem Relation Age of Onset   Lung cancer Father    Asthma Son    Colon cancer Neg Hx    Esophageal cancer Neg Hx    Rectal cancer Neg Hx    Stomach cancer Neg Hx      Social History   Tobacco Use   Smoking status: Every Day    Packs/day: 2.00    Years: 30.00    Total pack years: 60.00    Types: Cigarettes    Passive exposure: Current   Smokeless tobacco: Current    Types: Chew   Tobacco comments:    Smokes a ppd.  Wants to quit.  08/10/2021.  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   Physical Exam:  BP 129/88 (BP Location: Left Arm, Patient Position: Sitting, Cuff Size: Normal)   Pulse (!) 105   Temp 98.6 F (37 C) (Oral)   Ht '5\' 10"'$  (1.778 m)   Wt 149 lb (67.6 kg)   BMI 21.38 kg/m  No LMP for male patient.  General:   Alert, thin built, moderately nourished, pleasant and cooperative in NAD Head:  Normocephalic and atraumatic. Eyes:  Sclera clear, no icterus.   Conjunctiva pink. Ears:  Normal auditory acuity. Nose:  No deformity, discharge, or lesions. Mouth:  No deformity or  lesions,oropharynx pink & moist. Neck:  Supple; no masses or thyromegaly. Lungs:  Respirations even and unlabored.  Clear throughout to auscultation.   No wheezes, crackles, or rhonchi. No acute distress. Heart:  Regular rate and rhythm; no murmurs, clicks, rubs, or gallops. Abdomen:  Normal bowel sounds. Soft, non-tender and non-distended without masses, hepatosplenomegaly or hernias noted.  No guarding or rebound tenderness.   Rectal: Not performed Msk:  Symmetrical without gross deformities. Good, equal movement & strength bilaterally. Pulses:  Normal pulses noted. Extremities:  No clubbing or edema.  No cyanosis. Neurologic:  Alert and oriented x3;  grossly normal neurologically. Skin:  Intact without significant lesions or rashes. No jaundice. Psych:  Alert and cooperative. Normal mood and affect.  Imaging Studies: Reviewed  Assessment and Plan:   Albert Hill is a 46 y.o. male with history of chronic tobacco use, marijuana use is seen in consultation for more than 1 year history of chronic nausea and vomiting, abdominal bloating, weight loss.  Upper endoscopy and colonoscopy were unremarkable However gastric and duodenal biopsies were not performed Check H. pylori breath test and celiac  disease panel Given history of heavy tobacco use, check pancreatic fecal elastase levels to rule out EPI Empirically start Zenpep 40 K 2 capsules with each meal and 1 with snack If above workup is negative, recommend gastric emptying study to evaluate for gastroparesis   Follow up in 3 to 4 months   Cephas Darby, MD

## 2022-01-25 ENCOUNTER — Other Ambulatory Visit: Payer: Self-pay

## 2022-01-25 ENCOUNTER — Encounter
Admission: RE | Admit: 2022-01-25 | Discharge: 2022-01-25 | Disposition: A | Discharge status: Home / Self Care | Payer: BC Managed Care – PPO | Source: Ambulatory Visit | Attending: Surgery | Admitting: Surgery

## 2022-01-25 NOTE — Patient Instructions (Addendum)
Your procedure is scheduled on: 01/31/22 - Tuesday Report to the Registration Desk on the 1st floor of the [facility]. To find out your arrival time, please call [phone] between 1PM - 3PM on: 01/30/22 - Monday If your arrival time is 6:00 am, do not arrive prior to that time as the [facility] entrance doors do not open until 6:00 am.  REMEMBER: Instructions that are not followed completely may result in serious medical risk, up to and including death; or upon the discretion of your surgeon and anesthesiologist your surgery may need to be rescheduled.  Do not eat food after midnight the night before surgery.  No gum chewing, lozengers or hard candies.  You may however, drink CLEAR liquids up to 2 hours before you are scheduled to arrive for your surgery. Do not drink anything within 2 hours of your scheduled arrival time.  Clear liquids include: - water  - apple juice without pulp - gatorade (not RED colors) - black coffee or tea (Do NOT add milk or creamers to the coffee or tea) Do NOT drink anything that is not on this list.  TAKE THESE MEDICATIONS THE MORNING OF SURGERY WITH A SIP OF WATER:  - Albuterol Sulfate, sensor, (PROAIR DIGIHALER)  - omeprazole (PRILOSEC) 20 MG capsule, (take one the night before and one on the morning of surgery - helps to prevent nausea after surgery.)  One week prior to surgery: Stop Anti-inflammatories (NSAIDS) such as Advil, Aleve, Ibuprofen, Motrin, Naproxen, Naprosyn and Aspirin based products such as Excedrin, Goodys Powder, BC Powder.  Stop ANY OVER THE COUNTER supplements until after surgery.  You may take Tylenol if needed for pain up until the day of surgery.  No Alcohol for 24 hours before or after surgery.  No Smoking including e-cigarettes for 24 hours prior to surgery.  No chewable tobacco products for at least 6 hours prior to surgery.  No nicotine patches on the day of surgery.  Do not use any "recreational" drugs for at  least a week prior to your surgery.  Please be advised that the combination of cocaine and anesthesia may have negative outcomes, up to and including death. If you test positive for cocaine, your surgery will be cancelled.  On the morning of surgery brush your teeth with toothpaste and water, you may rinse your mouth with mouthwash if you wish. Do not swallow any toothpaste or mouthwash.  Do not wear jewelry, make-up, hairpins, clips or nail polish.  Do not wear lotions, powders, or perfumes.   Do not shave body from the neck down 48 hours prior to surgery just in case you cut yourself which could leave a site for infection.  Also, freshly shaved skin may become irritated if using the CHG soap.  Contact lenses, hearing aids and dentures may not be worn into surgery.  Do not bring valuables to the hospital. [facility] is not responsible for any missing/lost belongings or valuables.   Notify your doctor if there is any change in your medical condition (cold, fever, infection).  Wear comfortable clothing (specific to your surgery type) to the hospital.  After surgery, you can help prevent lung complications by doing breathing exercises.  Take deep breaths and cough every 1-2 hours. Your doctor may order a device called an Incentive Spirometer to help you take deep breaths. When coughing or sneezing, hold a pillow firmly against your incision with both hands. This is called "splinting." Doing this helps protect your incision. It also decreases  belly discomfort.  If you are being admitted to the hospital overnight, leave your suitcase in the car. After surgery it may be brought to your room.  If you are being discharged the day of surgery, you will not be allowed to drive home. You will need a responsible adult (18 years or older) to drive you home and stay with you that night.   If you are taking public transportation, you will need to have a responsible adult (18 years or older) with  you. Please confirm with your physician that it is acceptable to use public transportation.   Please call the [facility] at [phone] if you have any questions about these instructions.  Surgery Visitation Policy:  Patients undergoing a surgery or procedure may have two family members or support persons with them as long as the person is not COVID-19 positive or experiencing its symptoms.   Inpatient Visitation:    Visiting hours are 7 a.m. to 8 p.m. Up to four visitors are allowed at one time in a patient room, including children. The visitors may rotate out with other people during the day. One designated support person (adult) may remain overnight.

## 2022-01-26 ENCOUNTER — Encounter: Payer: Self-pay | Admitting: Gastroenterology

## 2022-01-26 LAB — H. PYLORI BREATH TEST: H pylori Breath Test: NEGATIVE

## 2022-01-26 LAB — CELIAC DISEASE PANEL
Endomysial IgA: NEGATIVE
IgA/Immunoglobulin A, Serum: 231 mg/dL (ref 90–386)
Transglutaminase IgA: <2 U/mL (ref 0–3)

## 2022-01-30 ENCOUNTER — Telehealth: Payer: Self-pay

## 2022-01-30 DIAGNOSIS — R112 Nausea with vomiting, unspecified: Secondary | ICD-10-CM

## 2022-01-30 MED ORDER — CHLORHEXIDINE GLUCONATE CLOTH 2 % EX PADS: 6.0000 | MEDICATED_PAD | Freq: Once | CUTANEOUS | Status: DC

## 2022-01-30 MED ORDER — CEFAZOLIN SODIUM-DEXTROSE 2-4 GM/100ML-% IV SOLN
2.0000 g | INTRAVENOUS | Status: AC
  Administered 2022-01-31: 2 g via INTRAVENOUS

## 2022-01-30 MED ORDER — GABAPENTIN 300 MG PO CAPS: 300.0000 mg | ORAL_CAPSULE | ORAL | Status: AC

## 2022-01-30 MED ORDER — ORAL CARE MOUTH RINSE: 15.0000 mL | Freq: Once | OROMUCOSAL | Status: DC

## 2022-01-30 MED ORDER — LACTATED RINGERS IV SOLN: INTRAVENOUS | Status: DC

## 2022-01-30 MED ORDER — BUPIVACAINE LIPOSOME 1.3 % IJ SUSP: 20.0000 mL | Freq: Once | INTRAMUSCULAR | Status: DC

## 2022-01-30 MED ORDER — CHLORHEXIDINE GLUCONATE 0.12 % MT SOLN: 15.0000 mL | Freq: Once | OROMUCOSAL | Status: DC

## 2022-01-30 MED ORDER — ACETAMINOPHEN 500 MG PO TABS: 1000.0000 mg | ORAL_TABLET | ORAL | Status: AC

## 2022-01-30 NOTE — Telephone Encounter (Signed)
Order the gastric emptying study. Called central scheduling and got patient schedule for 02/10/2022 arrive at 7:45am for 8:00am scan. Nothing to eat or drink after midnight. No Zofran 8 hours before the scan. Informed patient and the patient verbalized understanding

## 2022-01-30 NOTE — Telephone Encounter (Signed)
-----   Message from Lin Landsman, MD sent at 01/26/2022  6:00 PM EDT ----- Celiac disease panel and H. pylori breath test came back normal.  I am waiting on pancreatic fecal elastase results.  Please remind patient about the stool specimen Also, I recommend gastric emptying study DX: Chronic nausea and vomiting  RV

## 2022-01-31 ENCOUNTER — Other Ambulatory Visit: Payer: Self-pay

## 2022-01-31 ENCOUNTER — Encounter: Payer: Self-pay | Admitting: Surgery

## 2022-01-31 ENCOUNTER — Encounter
Admission: RE | Disposition: A | Discharge status: Home / Self Care | Payer: Self-pay | Source: Home / Self Care | Attending: Surgery

## 2022-01-31 ENCOUNTER — Ambulatory Visit: Payer: BC Managed Care – PPO | Admitting: Urgent Care

## 2022-01-31 ENCOUNTER — Ambulatory Visit
Admission: RE | Admit: 2022-01-31 | Discharge: 2022-01-31 | Disposition: A | Discharge status: Home / Self Care | Payer: BC Managed Care – PPO | Attending: Surgery | Admitting: Surgery

## 2022-01-31 DIAGNOSIS — G4730 Sleep apnea, unspecified: Secondary | ICD-10-CM | POA: Insufficient documentation

## 2022-01-31 DIAGNOSIS — F17220 Nicotine dependence, chewing tobacco, uncomplicated: Secondary | ICD-10-CM | POA: Diagnosis not present

## 2022-01-31 DIAGNOSIS — K4090 Unilateral inguinal hernia, without obstruction or gangrene, not specified as recurrent: Secondary | ICD-10-CM

## 2022-01-31 DIAGNOSIS — K4020 Bilateral inguinal hernia, without obstruction or gangrene, not specified as recurrent: Secondary | ICD-10-CM

## 2022-01-31 DIAGNOSIS — I10 Essential (primary) hypertension: Secondary | ICD-10-CM | POA: Diagnosis not present

## 2022-01-31 DIAGNOSIS — F17210 Nicotine dependence, cigarettes, uncomplicated: Secondary | ICD-10-CM | POA: Diagnosis not present

## 2022-01-31 HISTORY — PX: XI ROBOTIC ASSISTED INGUINAL HERNIA REPAIR WITH MESH: SHX6706

## 2022-01-31 SURGERY — REPAIR, HERNIA, INGUINAL, ROBOT-ASSISTED, LAPAROSCOPIC, USING MESH
Anesthesia: General | Site: Inguinal | Laterality: Bilateral

## 2022-01-31 MED ORDER — BUPIVACAINE-EPINEPHRINE (PF) 0.5% -1:200000 IJ SOLN
INTRAMUSCULAR | Status: AC
  Filled 2022-01-31: qty 30

## 2022-01-31 MED ORDER — IBUPROFEN 600 MG PO TABS: 600.0000 mg | ORAL_TABLET | Freq: Three times a day (TID) | ORAL | 1 refills | Status: DC | PRN

## 2022-01-31 MED ORDER — MIDAZOLAM HCL 2 MG/2ML IJ SOLN
INTRAMUSCULAR | Status: DC | PRN
  Administered 2022-01-31: 2 mg via INTRAVENOUS

## 2022-01-31 MED ORDER — KETAMINE HCL 10 MG/ML IJ SOLN
INTRAMUSCULAR | Status: DC | PRN
  Administered 2022-01-31: 20 mg via INTRAVENOUS
  Administered 2022-01-31: 30 mg via INTRAVENOUS

## 2022-01-31 MED ORDER — PROPOFOL 10 MG/ML IV BOLUS
INTRAVENOUS | Status: DC | PRN
  Administered 2022-01-31: 200 mg via INTRAVENOUS

## 2022-01-31 MED ORDER — CEFAZOLIN SODIUM-DEXTROSE 2-4 GM/100ML-% IV SOLN
INTRAVENOUS | Status: AC
  Filled 2022-01-31: qty 100

## 2022-01-31 MED ORDER — ACETAMINOPHEN 10 MG/ML IV SOLN: 1000.0000 mg | Freq: Once | INTRAVENOUS | Status: DC | PRN

## 2022-01-31 MED ORDER — PHENYLEPHRINE HCL (PRESSORS) 10 MG/ML IV SOLN
INTRAVENOUS | Status: DC | PRN
  Administered 2022-01-31 (×3): 80 ug via INTRAVENOUS

## 2022-01-31 MED ORDER — SUGAMMADEX SODIUM 200 MG/2ML IV SOLN
INTRAVENOUS | Status: DC | PRN
  Administered 2022-01-31: 180 mg via INTRAVENOUS

## 2022-01-31 MED ORDER — ACETAMINOPHEN 500 MG PO TABS: 1000.0000 mg | ORAL_TABLET | Freq: Four times a day (QID) | ORAL | Status: DC | PRN

## 2022-01-31 MED ORDER — ROCURONIUM BROMIDE 10 MG/ML (PF) SYRINGE
PREFILLED_SYRINGE | INTRAVENOUS | Status: AC
  Filled 2022-01-31: qty 10

## 2022-01-31 MED ORDER — OXYCODONE HCL 5 MG PO TABS
5.0000 mg | ORAL_TABLET | Freq: Once | ORAL | Status: AC | PRN
  Administered 2022-01-31: 5 mg via ORAL

## 2022-01-31 MED ORDER — ROCURONIUM BROMIDE 100 MG/10ML IV SOLN
INTRAVENOUS | Status: DC | PRN
  Administered 2022-01-31: 20 mg via INTRAVENOUS
  Administered 2022-01-31: 50 mg via INTRAVENOUS
  Administered 2022-01-31: 10 mg via INTRAVENOUS
  Administered 2022-01-31: 20 mg via INTRAVENOUS

## 2022-01-31 MED ORDER — MIDAZOLAM HCL 2 MG/2ML IJ SOLN
INTRAMUSCULAR | Status: AC
  Filled 2022-01-31: qty 2

## 2022-01-31 MED ORDER — OXYCODONE HCL 5 MG/5ML PO SOLN: 5.0000 mg | Freq: Once | ORAL | Status: AC | PRN

## 2022-01-31 MED ORDER — OXYCODONE HCL 5 MG PO TABS: 5.0000 mg | ORAL_TABLET | ORAL | 0 refills | Status: DC | PRN

## 2022-01-31 MED ORDER — LIDOCAINE HCL (PF) 2 % IJ SOLN
INTRAMUSCULAR | Status: AC
  Filled 2022-01-31: qty 5

## 2022-01-31 MED ORDER — BUPIVACAINE LIPOSOME 1.3 % IJ SUSP
INTRAMUSCULAR | Status: AC
  Filled 2022-01-31: qty 20

## 2022-01-31 MED ORDER — ONDANSETRON HCL 4 MG/2ML IJ SOLN
INTRAMUSCULAR | Status: DC | PRN
  Administered 2022-01-31: 4 mg via INTRAVENOUS

## 2022-01-31 MED ORDER — SODIUM CHLORIDE FLUSH 0.9 % IV SOLN
INTRAVENOUS | Status: AC
Start: 2022-01-31 — End: ?
  Filled 2022-01-31: qty 10

## 2022-01-31 MED ORDER — LACTATED RINGERS IV SOLN: INTRAVENOUS | Status: DC

## 2022-01-31 MED ORDER — ONDANSETRON HCL 4 MG/2ML IJ SOLN: 4.0000 mg | Freq: Once | INTRAMUSCULAR | Status: DC | PRN

## 2022-01-31 MED ORDER — GABAPENTIN 300 MG PO CAPS
ORAL_CAPSULE | ORAL | Status: AC
  Administered 2022-01-31: 300 mg via ORAL
  Filled 2022-01-31: qty 1

## 2022-01-31 MED ORDER — DEXAMETHASONE SODIUM PHOSPHATE 10 MG/ML IJ SOLN
INTRAMUSCULAR | Status: DC | PRN
  Administered 2022-01-31: 5 mg via INTRAVENOUS

## 2022-01-31 MED ORDER — ALBUTEROL SULFATE HFA 108 (90 BASE) MCG/ACT IN AERS
INHALATION_SPRAY | RESPIRATORY_TRACT | Status: AC
  Filled 2022-01-31: qty 6.7

## 2022-01-31 MED ORDER — SUCCINYLCHOLINE CHLORIDE 200 MG/10ML IV SOSY
PREFILLED_SYRINGE | INTRAVENOUS | Status: DC | PRN
  Administered 2022-01-31: 100 mg via INTRAVENOUS

## 2022-01-31 MED ORDER — KETAMINE HCL 50 MG/5ML IJ SOSY
PREFILLED_SYRINGE | INTRAMUSCULAR | Status: AC
  Filled 2022-01-31: qty 5

## 2022-01-31 MED ORDER — ACETAMINOPHEN 500 MG PO TABS
ORAL_TABLET | ORAL | Status: AC
  Administered 2022-01-31: 1000 mg via ORAL
  Filled 2022-01-31: qty 2

## 2022-01-31 MED ORDER — ALBUTEROL SULFATE HFA 108 (90 BASE) MCG/ACT IN AERS
INHALATION_SPRAY | RESPIRATORY_TRACT | Status: DC | PRN
  Administered 2022-01-31 (×2): 4 via RESPIRATORY_TRACT

## 2022-01-31 MED ORDER — SODIUM CHLORIDE 0.9 % IR SOLN
Status: DC | PRN
  Administered 2022-01-31: 1000 mL

## 2022-01-31 MED ORDER — OXYCODONE HCL 5 MG PO TABS
ORAL_TABLET | ORAL | Status: AC
  Filled 2022-01-31: qty 1

## 2022-01-31 MED ORDER — ONDANSETRON HCL 4 MG/2ML IJ SOLN
INTRAMUSCULAR | Status: AC
  Filled 2022-01-31: qty 2

## 2022-01-31 MED ORDER — PROPOFOL 10 MG/ML IV BOLUS
INTRAVENOUS | Status: AC
  Filled 2022-01-31: qty 20

## 2022-01-31 MED ORDER — FENTANYL CITRATE (PF) 100 MCG/2ML IJ SOLN
INTRAMUSCULAR | Status: AC
  Filled 2022-01-31: qty 2

## 2022-01-31 MED ORDER — EPHEDRINE SULFATE (PRESSORS) 50 MG/ML IJ SOLN
INTRAMUSCULAR | Status: DC | PRN
  Administered 2022-01-31: 10 mg via INTRAVENOUS

## 2022-01-31 MED ORDER — CHLORHEXIDINE GLUCONATE 0.12 % MT SOLN
OROMUCOSAL | Status: AC
  Filled 2022-01-31: qty 15

## 2022-01-31 MED ORDER — SODIUM CHLORIDE (PF) 0.9 % IJ SOLN
INTRAMUSCULAR | Status: AC
  Filled 2022-01-31: qty 50

## 2022-01-31 MED ORDER — FENTANYL CITRATE (PF) 100 MCG/2ML IJ SOLN
INTRAMUSCULAR | Status: DC | PRN
  Administered 2022-01-31 (×2): 50 ug via INTRAVENOUS

## 2022-01-31 MED ORDER — LIDOCAINE HCL (CARDIAC) PF 100 MG/5ML IV SOSY
PREFILLED_SYRINGE | INTRAVENOUS | Status: DC | PRN
  Administered 2022-01-31: 100 mg via INTRAVENOUS

## 2022-01-31 MED ORDER — FENTANYL CITRATE (PF) 100 MCG/2ML IJ SOLN
25.0000 ug | INTRAMUSCULAR | Status: DC | PRN
  Administered 2022-01-31: 50 ug via INTRAVENOUS

## 2022-01-31 MED ORDER — SUCCINYLCHOLINE CHLORIDE 200 MG/10ML IV SOSY
PREFILLED_SYRINGE | INTRAVENOUS | Status: AC
  Filled 2022-01-31: qty 10

## 2022-01-31 MED ORDER — BUPIVACAINE-EPINEPHRINE (PF) 0.5% -1:200000 IJ SOLN
INTRAMUSCULAR | Status: DC | PRN
  Administered 2022-01-31: 50 mL via INTRAMUSCULAR

## 2022-01-31 SURGICAL SUPPLY — 61 items
BLADE CLIPPER SURG (BLADE) ×1 IMPLANT
CANNULA REDUC XI 12-8 STAPL (CANNULA) ×2
CANNULA REDUCER 12-8 DVNC XI (CANNULA) ×1 IMPLANT
COVER TIP SHEARS 8 DVNC (MISCELLANEOUS) ×1 IMPLANT
COVER TIP SHEARS 8MM DA VINCI (MISCELLANEOUS) ×2
COVER WAND RF STERILE (DRAPES) ×2 IMPLANT
DERMABOND ADVANCED (GAUZE/BANDAGES/DRESSINGS) ×1
DERMABOND ADVANCED .7 DNX12 (GAUZE/BANDAGES/DRESSINGS) ×1 IMPLANT
DRAPE ARM DVNC X/XI (DISPOSABLE) ×3 IMPLANT
DRAPE COLUMN DVNC XI (DISPOSABLE) ×1 IMPLANT
DRAPE DA VINCI XI ARM (DISPOSABLE) ×6
DRAPE DA VINCI XI COLUMN (DISPOSABLE) ×2
ELECT CAUTERY BLADE TIP 2.5 (TIP) ×2
ELECT REM PT RETURN 9FT ADLT (ELECTROSURGICAL) ×2
ELECTRODE CAUTERY BLDE TIP 2.5 (TIP) ×1 IMPLANT
ELECTRODE REM PT RTRN 9FT ADLT (ELECTROSURGICAL) ×1 IMPLANT
GLOVE SURG SYN 7.0 (GLOVE) ×4 IMPLANT
GLOVE SURG SYN 7.0 PF PI (GLOVE) ×2 IMPLANT
GLOVE SURG SYN 7.5  E (GLOVE) ×6
GLOVE SURG SYN 7.5 E (GLOVE) ×3 IMPLANT
GLOVE SURG SYN 7.5 PF PI (GLOVE) ×2 IMPLANT
GOWN STRL REUS W/ TWL LRG LVL3 (GOWN DISPOSABLE) ×4 IMPLANT
GOWN STRL REUS W/TWL LRG LVL3 (GOWN DISPOSABLE) ×6
IRRIGATION STRYKERFLOW (MISCELLANEOUS) ×1 IMPLANT
IRRIGATOR STRYKERFLOW (MISCELLANEOUS) ×2
IV NS 1000ML (IV SOLUTION) ×2
IV NS 1000ML BAXH (IV SOLUTION) IMPLANT
KIT PINK PAD W/HEAD ARE REST (MISCELLANEOUS) ×2
KIT PINK PAD W/HEAD ARM REST (MISCELLANEOUS) ×1 IMPLANT
L-HOOK LAP DISP 36CM (ELECTROSURGICAL) ×2
LABEL OR SOLS (LABEL) ×2 IMPLANT
LHOOK LAP DISP 36CM (ELECTROSURGICAL) IMPLANT
MANIFOLD NEPTUNE II (INSTRUMENTS) ×2 IMPLANT
MESH 3DMAX MID 4X6 LT LRG (Mesh General) ×1 IMPLANT
MESH 3DMAX MID 4X6 RT LRG (Mesh General) ×1 IMPLANT
NDL INSUFFLATION 14GA 120MM (NEEDLE) ×1 IMPLANT
NEEDLE HYPO 22GX1.5 SAFETY (NEEDLE) ×2 IMPLANT
NEEDLE INSUFFLATION 14GA 120MM (NEEDLE) ×2 IMPLANT
OBTURATOR OPTICAL STANDARD 8MM (TROCAR) ×2
OBTURATOR OPTICAL STND 8 DVNC (TROCAR) ×1
OBTURATOR OPTICALSTD 8 DVNC (TROCAR) ×1 IMPLANT
PACK LAP CHOLECYSTECTOMY (MISCELLANEOUS) ×2 IMPLANT
PENCIL SMOKE EVACUATOR (MISCELLANEOUS) ×1 IMPLANT
PENCIL SMOKE EVACUATOR COATED (MISCELLANEOUS) IMPLANT
SEAL CANN UNIV 5-8 DVNC XI (MISCELLANEOUS) ×3 IMPLANT
SEAL XI 5MM-8MM UNIVERSAL (MISCELLANEOUS) ×6
SET TUBE SMOKE EVAC HIGH FLOW (TUBING) ×2 IMPLANT
SOLUTION ELECTROLUBE (MISCELLANEOUS) ×2 IMPLANT
STAPLER CANNULA SEAL DVNC XI (STAPLE) ×1 IMPLANT
STAPLER CANNULA SEAL XI (STAPLE) ×2
SUT MNCRL AB 4-0 PS2 18 (SUTURE) ×2 IMPLANT
SUT VIC AB 2-0 SH 27 (SUTURE) ×2
SUT VIC AB 2-0 SH 27XBRD (SUTURE) ×2 IMPLANT
SUT VIC AB 3-0 SH 27 (SUTURE) ×2
SUT VIC AB 3-0 SH 27X BRD (SUTURE) IMPLANT
SUT VICRYL 0 AB UR-6 (SUTURE) ×3 IMPLANT
SUT VLOC 90 S/L VL9 GS22 (SUTURE) ×3 IMPLANT
TAPE TRANSPORE STRL 2 31045 (GAUZE/BANDAGES/DRESSINGS) ×2 IMPLANT
TRAY FOLEY SLVR 16FR LF STAT (SET/KITS/TRAYS/PACK) ×2 IMPLANT
TROCAR BALLN GELPORT 12X130M (ENDOMECHANICALS) ×2 IMPLANT
WATER STERILE IRR 500ML POUR (IV SOLUTION) ×2 IMPLANT

## 2022-01-31 NOTE — Anesthesia Postprocedure Evaluation (Signed)
Anesthesia Post Note  Patient: Albert Hill  Procedure(s) Performed: XI ROBOTIC ASSISTED INGUINAL HERNIA REPAIR WITH MESH (Bilateral: Inguinal)  Patient location during evaluation: PACU Anesthesia Type: General Level of consciousness: awake and alert Pain management: pain level controlled Vital Signs Assessment: post-procedure vital signs reviewed and stable Respiratory status: spontaneous breathing, nonlabored ventilation, respiratory function stable and patient connected to nasal cannula oxygen Cardiovascular status: blood pressure returned to baseline and stable Postop Assessment: no apparent nausea or vomiting Anesthetic complications: no   No notable events documented.   Last Vitals:  Vitals:   01/31/22 1645 01/31/22 1700  BP: 103/75 95/74  Pulse: 87 96  Resp: 14 15  Temp:    SpO2: 92% 95%    Last Pain:  Vitals:   01/31/22 1700  TempSrc:   PainSc: 5                  Albert Hill

## 2022-01-31 NOTE — Op Note (Signed)
Procedure Date:  01/31/2022  Pre-operative Diagnosis:  Left inguinal hernia  Post-operative Diagnosis: Bilateral inguinal hernia  Procedure: 1.  Robotic assisted Bilateral Inguinal Hernia Repair 2.  Creation of Bilateral Posterior Rectus-Transversalis Fascia Advancment Flap for Coverage of Pelvic Wound (200 cm)  Surgeon:  Melvyn Neth, MD  Anesthesia:  General endotracheal  Estimated Blood Loss:  200 ml  Specimens:  None  Complications:  Bleeding from the liver during initial insufflation, controlled with cautery.  Indications for Procedure:  This is a 46 y.o. male who presents with a left inguinal hernia.  The options of surgery versus observation were reviewed with the patient and/or family. The risks of bleeding, abscess or infection, recurrence of symptoms, potential for an open procedure, injury to surrounding structures, and chronic pain were all discussed with the patient and he was willing to proceed.  We have planned this transabdominal procedure with the creation of left peritoneal flap based on the posterior rectus sheath and transversalis fascia in order to fully cover the mesh, creating a natural tisssue barrier for the bowel and peritoneal cavity.  Description of Procedure: The patient was correctly identified in the preoperative area and brought into the operating room.  The patient was placed supine with VTE prophylaxis in place.  Appropriate time-outs were performed.  Anesthesia was induced and the patient was intubated.  Foley catheter was placed.  Appropriate antibiotics were infused.  The abdomen was prepped and draped in a sterile fashion. We attempted initial insufflation using a Veress needle going in the right upper quadrant.  We had high insufflation pressure and some blood come up from the needle itself.  At that point, needle was removed, and a supraumbilical incision was made. A cutdown technique was used to enter the abdominal cavity without injury, and a  Hasson trocar was inserted.  Pneumoperitoneum was obtained with appropriate opening pressures.  Initial inspection with the camera showed blood in the RUQ behind the liver and two small injuries to the anterior liver surface, one of which was oozing.   Two 8-mm robotic ports were placed in the right and left lateral positions under direct visualization.  Cautery was used to cauterize the area of oozing, and suction/irrigator was used to suction the blood and irrigate the RUQ.  Blood loss was estimated at around 200 ml.  The [employer] platform was docked onto the patient, the camera was inserted and targeted, and the instruments were placed under direct visualization.  The patient had adhesions of the omentum to the umbilical area from prior umbilical hernia repair.  Adhesions were taken down using cautery.  Then,both inguinal regions were inspected for hernias and it was confirmed that the patient had bilateral inguinal hernia.  A large left and right Bard 3D Max Mid Mesh, a 2-0 Vicryl, and two 2-0 vlock suture were placed through the umbilical port under direct visualization.    We started with the left side which was symptomatic.  Using electocautery, the peritoneal and posterior rectus tissue flap was created.  The peritoneum on the left side was scored from the median umbilical ligament laterally towards the ASIS.  The flap was mobilized using robotic scissors and the bipolar instruments, creating a plane along the posterior rectus sheath and transversalis fascia down to the pubic tubercle medially. It was then further mobilized laterally across the inguinal canal and femoral vessels and onto the psoas muscle. The inferior epigastric vessels were identified and preserved. This created a posterior rectus and peritoneal flap measuring roughly  17 cm x 12 cm.  The patient had a direct hernia.  The hernia sac and contents were reduced preserving all structures.  A large left Bard 3D Max Mid mesh was placed with  good overlap along all the potential hernia defects and secured in place with 2-0 Vicryl along the medial superomedial and superolateral aspects.    We then moved to the right side.  Using electocautery, the peritoneal and posterior rectus tissue flap was created.  The peritoneum on the right side was scored from the median umbilical ligament laterally towards the ASIS.  The flap was mobilized using robotic scissors and the bipolar instruments, creating a plane along the posterior rectus sheath and transversalis fascia down to the pubic tubercle medially. It was then further mobilized laterally across the inguinal canal and femoral vessels and onto the psoas muscle. The inferior epigastric vessels were identified and preserved. This created a posterior rectus and peritoneal flap measuring roughly 17 cm x 12 cm.  The patient had a direct and a femoral hernia.  The hernia sac and contents were reduced preserving all structures.  A large right Bard 3D Max Mid mesh was placed with good overlap along all the potential hernia defects and secured in place with 2-0 Vicryl along the medial superomedial and superolateral aspects.    Then, the peritoneal flap was advanced over the mesh on both sides and carried over to close the defect. A running 2-0 V lock suture was used to approximate the edge of the flap onto the peritoneum on both sides.  All needles were removed under direct visualization.  Another look in the RUQ revealed no further bleeding.  The 8- mm ports were removed under direct visualization and the Hasson trocar was removed.  The fascial opening was closed using 0 vicryl suture.  Local anesthetic was infused in all incisions as well as a bilateral ilioinguinal block. and the incisions were closed with 3-0 Vicryl and 4-0 Monocryl.  The wounds were cleaned and sealed with DermaBond.  Foley catheter was removed and the patient was emerged from anesthesia and extubated and brought to the recovery room for  further management.  The patient tolerated the procedure well and all counts were correct at the end of the case.   Melvyn Neth, MD

## 2022-01-31 NOTE — Discharge Instructions (Signed)

## 2022-01-31 NOTE — Interval H&P Note (Signed)
History and Physical Interval Note:  01/31/2022 12:27 PM  Albert Hill  has presented today for surgery, with the diagnosis of left inguinal hernia.  The various methods of treatment have been discussed with the patient and family. After consideration of risks, benefits and other options for treatment, the patient has consented to  Procedure(s): XI ROBOTIC [place] (Left) as a surgical intervention.  The patient's history has been reviewed, patient examined, no change in status, stable for surgery.  I have reviewed the patient's chart and labs.  Questions were answered to the patient's satisfaction.     Merrissa Giacobbe

## 2022-01-31 NOTE — Anesthesia Procedure Notes (Signed)
Procedure Name: Intubation Date/Time: 01/31/2022 1:26 PM  Performed by: Fredderick Phenix, CRNAPre-anesthesia Checklist: Patient identified, Emergency Drugs available, Suction available and Patient being monitored Patient Re-evaluated:Patient Re-evaluated prior to induction Oxygen Delivery Method: Circle system utilized Preoxygenation: Pre-oxygenation with 100% oxygen Induction Type: IV induction Ventilation: Mask ventilation without difficulty Laryngoscope Size: McGraph and 4 Grade View: Grade I Tube type: Oral Tube size: 7.0 mm Number of attempts: 1 Airway Equipment and Method: Stylet and Oral airway Placement Confirmation: ETT inserted through vocal cords under direct vision, positive ETCO2 and breath sounds checked- equal and bilateral Secured at: 21 cm Tube secured with: Tape Dental Injury: Teeth and Oropharynx as per pre-operative assessment

## 2022-01-31 NOTE — Anesthesia Preprocedure Evaluation (Addendum)
Anesthesia Evaluation  Patient identified by MRN, date of birth, ID band Patient awake    Reviewed: Allergy & Precautions, NPO status , Patient's Chart, lab work & pertinent test results  History of Anesthesia Complications Negative for: history of anesthetic complications  Airway Mallampati: II   Neck ROM: Full    Dental   Crowns :   Pulmonary asthma , sleep apnea , COPD, Current Smoker (1 ppd) and Patient abstained from smoking.,    Pulmonary exam normal breath sounds clear to auscultation       Cardiovascular hypertension, Normal cardiovascular exam Rhythm:Regular Rate:Normal  ECG 01/04/22:  Sinus rhythm Biatrial enlargement Right axis deviation Nonspecific T abnormalities, lateral leads   Neuro/Psych negative neurological ROS     GI/Hepatic GERD  Medicated,  Endo/Other  negative endocrine ROS  Renal/GU negative Renal ROS     Musculoskeletal  (+) Arthritis , Gout    Abdominal   Peds  Hematology negative hematology ROS (+)   Anesthesia Other Findings   Reproductive/Obstetrics                            Anesthesia Physical Anesthesia Plan  ASA: 2  Anesthesia Plan: General   Post-op Pain Management:    Induction: Intravenous  PONV Risk Score and Plan: 1 and Ondansetron, Dexamethasone and Treatment may vary due to age or medical condition  Airway Management Planned: Oral ETT  Additional Equipment:   Intra-op Plan:   Post-operative Plan: Extubation in OR  Informed Consent: I have reviewed the patients History and Physical, chart, labs and discussed the procedure including the risks, benefits and alternatives for the proposed anesthesia with the patient or authorized representative who has indicated his/her understanding and acceptance.     Dental advisory given  Plan Discussed with: CRNA  Anesthesia Plan Comments: (Patient consented for risks of anesthesia including  but not limited to:  - adverse reactions to medications - damage to eyes, teeth, lips or other oral mucosa - nerve damage due to positioning  - sore throat or hoarseness - damage to heart, brain, nerves, lungs, other parts of body or loss of life  Informed patient about role of CRNA in peri- and intra-operative care.  Patient voiced understanding.)        Anesthesia Quick Evaluation

## 2022-01-31 NOTE — Transfer of Care (Signed)
Immediate Anesthesia Transfer of Care Note  Patient: Albert Hill  Procedure(s) Performed: XI ROBOTIC ASSISTED INGUINAL HERNIA REPAIR WITH MESH (Bilateral: Inguinal)  Patient Location: PACU  Anesthesia Type:General  Level of Consciousness: sedated  Airway & Oxygen Therapy: Patient Spontanous Breathing and Patient connected to face mask oxygen  Post-op Assessment: Report given to RN and Post -op Vital signs reviewed and stable  Post vital signs: Reviewed and stable  Last Vitals:  Vitals Value Taken Time  BP 115/68 01/31/22 1624  Temp    Pulse 98 01/31/22 1624  Resp 14 01/31/22 1624  SpO2 97 % 01/31/22 1624  Vitals shown include unvalidated device data.  Last Pain:  Vitals:   01/31/22 1242  TempSrc: Temporal  PainSc: 1          Complications: No notable events documented.

## 2022-02-01 ENCOUNTER — Encounter: Payer: Self-pay | Admitting: Surgery

## 2022-02-03 ENCOUNTER — Telehealth: Payer: Self-pay | Admitting: *Deleted

## 2022-02-03 NOTE — Telephone Encounter (Signed)
Spoke with the patient and let him know to rotate the medication. Patient told to alternate extra strength Tylenol with his Ibuprofen. He may take one of these every 4-6 hours. He did not pick up the Tylenol but this was sent in to his pharmacy. He was told that we would not refill the Oxycodone at this time. He may call back on Monday if he is still having more pain. Patient was not happy about this decision.

## 2022-02-03 NOTE — Telephone Encounter (Signed)
Patient called and stated that he had surgery on 01/31/22 with Dr Hampton Abbot left inguinal hernia- he is wanting to know if we can send him in another prescription for oxycodone, he said he will run out of them on Sunday  Pharmacy is [facility] in [place]

## 2022-02-03 NOTE — Telephone Encounter (Signed)
Patient instructed on use of ice and alternating Tylenol and Ibuprofen. He is aware that his request has been sent to [facility] for review.

## 2022-02-06 ENCOUNTER — Other Ambulatory Visit: Payer: Self-pay | Admitting: Surgery

## 2022-02-06 MED ORDER — OXYCODONE HCL 5 MG PO TABS: 5.0000 mg | ORAL_TABLET | ORAL | 0 refills | Status: DC | PRN

## 2022-02-07 ENCOUNTER — Ambulatory Visit (INDEPENDENT_AMBULATORY_CARE_PROVIDER_SITE_OTHER): Payer: Self-pay | Admitting: Neurosurgery

## 2022-02-07 DIAGNOSIS — Z09 Encounter for follow-up examination after completed treatment for conditions other than malignant neoplasm: Secondary | ICD-10-CM

## 2022-02-07 DIAGNOSIS — M5416 Radiculopathy, lumbar region: Secondary | ICD-10-CM

## 2022-02-07 DIAGNOSIS — Z9889 Other specified postprocedural states: Secondary | ICD-10-CM

## 2022-02-07 NOTE — Progress Notes (Signed)
Neurosurgery Telephone (Audio-Only) Note  Requesting Provider     Long, [place], PA-C [address] [place],  [place] [address] T: [phone] F: [phone]  Primary Care Provider [address] [place] [address] T: [phone] F: [phone]  Telehealth visit was conducted with Albert Hill, a 46 y.o. male via telephone.   History of Present Illness: Albert Hill is a 46 y.o s/p left L5-S1 microdiscectomy on 09/16/2021.  He does continue to have some soreness in his back and down his left leg he states that he is improved significantly from surgery.  He did recently have a notable hernia repair and is been recovering from this.  He denies any additional concerns today.  General Review of Systems:  A ROS was performed including pertinent positive and negatives as documented.  All other systems are negative.    Prior to Admission medications   Medication Sig Start Date End Date Taking? Authorizing Provider  acetaminophen (TYLENOL) 500 MG tablet Take 2 tablets (1,000 mg total) by mouth every 6 (six) hours as needed for mild pain. 01/31/22   Olean Ree, MD  albuterol (PROVENTIL) (2.5 MG/3ML) 0.083% nebulizer solution Take 2.5 mg by nebulization every 6 (six) hours as needed for shortness of breath or wheezing.    [provider]  Albuterol Sulfate, sensor, (PROAIR DIGIHALER) 108 (90 Base) MCG/ACT AEPB Inhale 2 puffs into the lungs 4 (four) times daily as needed. 08/10/21   Spero Geralds, MD  allopurinol (ZYLOPRIM) 300 MG tablet Take 300 mg by mouth daily. 11/04/20   [provider]  ibuprofen (ADVIL) 600 MG tablet Take 1 tablet (600 mg total) by mouth every 8 (eight) hours as needed for moderate pain. 01/31/22   Olean Ree, MD  lisinopril (ZESTRIL) 20 MG tablet Take 10 mg by mouth daily. 11/21/20   [provider]  Multiple Vitamin (MULTIVITAMIN WITH MINERALS) TABS tablet Take 1 tablet by mouth  daily. Patient not taking: Reported on 01/24/2022    [provider]  omeprazole (PRILOSEC) 20 MG capsule Take 20 mg by mouth as needed.    [provider]  ondansetron (ZOFRAN-ODT) 8 MG disintegrating tablet Take 1 tablet (8 mg total) by mouth every 8 (eight) hours as needed. 07/18/21   Esterwood, Amy S, PA-C  OVER THE COUNTER MEDICATION     [provider]  oxyCODONE (OXY IR/ROXICODONE) 5 MG immediate release tablet Take 1 tablet (5 mg total) by mouth every 4 (four) hours as needed for severe pain. 02/06/22   Olean Ree, MD  Pancrelipase, Lip-Prot-Amyl, (ZENPEP PO) Take 1 capsule by mouth 2 (two) times daily with a meal. Take with meals and snacks    [provider]    DATA REVIEWED    Imaging Studies  No interval imaging to review  Laboratory Studies   Recent Results (from the past 2160 hour(s))  Lipase, blood     Status: None   Collection Time: 01/04/22 12:04 PM  Result Value Ref Range   Lipase 23 11 - 51 U/L    Comment: Performed at [facility], [address], [place], [place] [address]  Comprehensive metabolic panel     Status: Abnormal   Collection Time: 01/04/22 12:04 PM  Result Value Ref Range   Sodium 139 135 - 145 mmol/L   Potassium 4.2 3.5 - 5.1 mmol/L   Chloride 104 98 - 111 mmol/L   CO2 27 22 - 32 mmol/L   Glucose, Bld 117 (H)  70 - 99 mg/dL    Comment: Glucose reference range applies only to samples taken after fasting for at least 8 hours.   BUN 16 6 - 20 mg/dL   Creatinine, Ser 0.80 0.61 - 1.24 mg/dL   Calcium 9.5 8.9 - 10.3 mg/dL   Total Protein 7.7 6.5 - 8.1 g/dL   Albumin 4.6 3.5 - 5.0 g/dL   AST 14 (L) 15 - 41 U/L   ALT 14 0 - 44 U/L   Alkaline Phosphatase 97 38 - 126 U/L   Total Bilirubin 0.6 0.3 - 1.2 mg/dL   GFR, Estimated >60 >60 mL/min    Comment: (NOTE) Calculated using the CKD-EPI Creatinine Equation (2021)    Anion gap 8 5 - 15    Comment: Performed at [facility], [address]  [place], [place] [address]  CBC     Status: Abnormal   Collection Time: 01/04/22 12:04 PM  Result Value Ref Range   WBC 10.8 (H) 4.0 - 10.5 K/uL   RBC 4.85 4.22 - 5.81 MIL/uL   Hemoglobin 14.8 13.0 - 17.0 g/dL   HCT 42.9 39.0 - 52.0 %   MCV 88.5 80.0 - 100.0 fL   MCH 30.5 26.0 - 34.0 pg   MCHC 34.5 30.0 - 36.0 g/dL   RDW 13.0 11.5 - 15.5 %   Platelets 306 150 - 400 K/uL   nRBC 0.0 0.0 - 0.2 %    Comment: Performed at [facility], [place], [place] [address]  Troponin I (High Sensitivity)     Status: None   Collection Time: 01/04/22 12:04 PM  Result Value Ref Range   Troponin I (High Sensitivity) 4 <18 ng/L    Comment: (NOTE) Elevated high sensitivity troponin I (hsTnI) values and significant  changes across serial measurements may suggest ACS but many other  chronic and acute conditions are known to elevate hsTnI results.  Refer to the "Links" section for chest pain algorithms and additional  guidance. Performed at [facility], [address] [place], [place] [address]   Urinalysis, Routine w reflex microscopic     Status: Abnormal   Collection Time: 01/04/22 12:07 PM  Result Value Ref Range   Color, Urine YELLOW (A) YELLOW   APPearance HAZY (A) CLEAR   Specific Gravity, Urine 1.016 1.005 - 1.030   pH 5.0 5.0 - 8.0   Glucose, UA NEGATIVE NEGATIVE mg/dL   Hgb urine dipstick NEGATIVE NEGATIVE   Bilirubin Urine NEGATIVE NEGATIVE   Ketones, ur NEGATIVE NEGATIVE mg/dL   Protein, ur NEGATIVE NEGATIVE mg/dL   Nitrite NEGATIVE NEGATIVE   Leukocytes,Ua NEGATIVE NEGATIVE    Comment: Performed at [facility], [address] [place], [place] [address]  H. pylori breath test     Status: None   Collection Time: 01/24/22  2:19 PM  Result Value Ref Range   H pylori Breath Test Negative Negative  Celiac Disease Panel     Status: None   Collection Time: 01/24/22  2:24 PM  Result Value Ref Range   Endomysial IgA Negative Negative    Transglutaminase IgA <2 0 - 3 U/mL    Comment:                               Negative        0 -  3  Weak Positive   4 - 10                               Positive           >10  Tissue Transglutaminase (tTG) has been identified  as the endomysial antigen.  Studies have demonstr-  ated that endomysial IgA antibodies have over 99%  specificity for gluten sensitive enteropathy.    IgA/Immunoglobulin A, Serum 231 90 - 386 mg/dL     IMPRESSION  Albert Hill is a 46 y.o. male who I performed a telephone encounter today for evaluation and management s/p lumbar microdiscectomy  PLAN  Albert Hill is a 46 y.o status post left L5-S1 microdiscectomy in March.  He is doing well from our standpoint despite having undergone a recent hernia repair.  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  He will let us know should he need a return to work note.  We will otherwise see him going forward on an as-needed basis.  He expressed understanding was in agreement with this plan.  No orders of the defined types were placed in this encounter.   DISPOSITION  Follow up: In person appointment in  PRN  Albert Hill, [facility]  TELEPHONE DOCUMENTATION   Patient location: home Provider location: office  I spent a total of 5 minutes non-face-to-face activities for this visit on the date of this encounter including review of current clinical condition and response to treatment.

## 2022-02-10 ENCOUNTER — Encounter: Payer: BC Managed Care – PPO | Attending: Gastroenterology

## 2022-02-14 ENCOUNTER — Encounter: Payer: Self-pay | Admitting: Physician Assistant

## 2022-05-19 DIAGNOSIS — Z419 Encounter for procedure for purposes other than remedying health state, unspecified: Secondary | ICD-10-CM | POA: Diagnosis not present

## 2022-05-25 ENCOUNTER — Other Ambulatory Visit: Payer: Self-pay

## 2022-05-28 IMAGING — US US EXTREM LOW VENOUS*L*
1 series · 14 of 24 positions shown · non-contrast
Comparison: None.

CLINICAL DATA: Pain and swelling

EXAM:
LEFT LOWER EXTREMITY VENOUS DOPPLER ULTRASOUND
TECHNIQUE: Gray-scale sonography with compression, as well as color and duplex
ultrasound, were performed to evaluate the deep venous system(s)
from the level of the common femoral vein through the popliteal and
proximal calf veins.

[Series 1: us venous img lower uni left (dvt) · portal-venous · 14 of 34 slices shown]
[im 1/34]
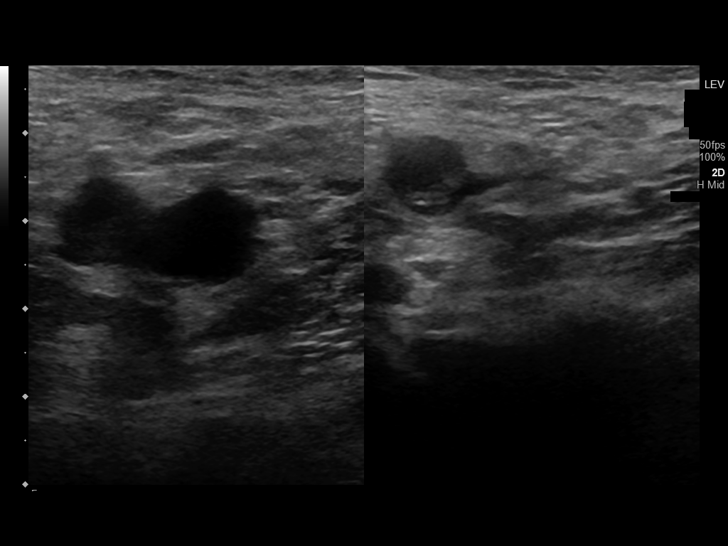
[im 3/34]
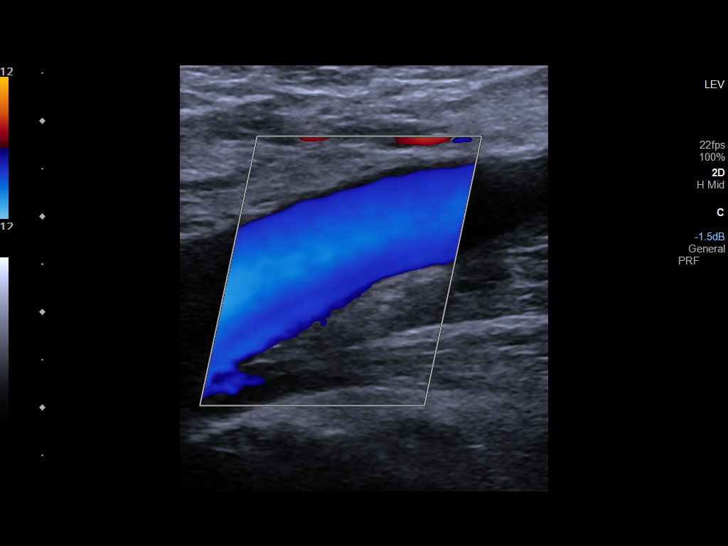
[im 6/34]
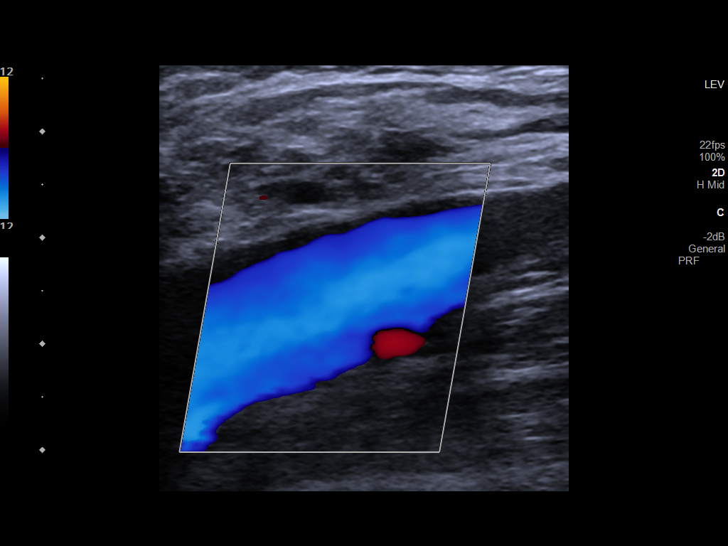
[im 9/34]
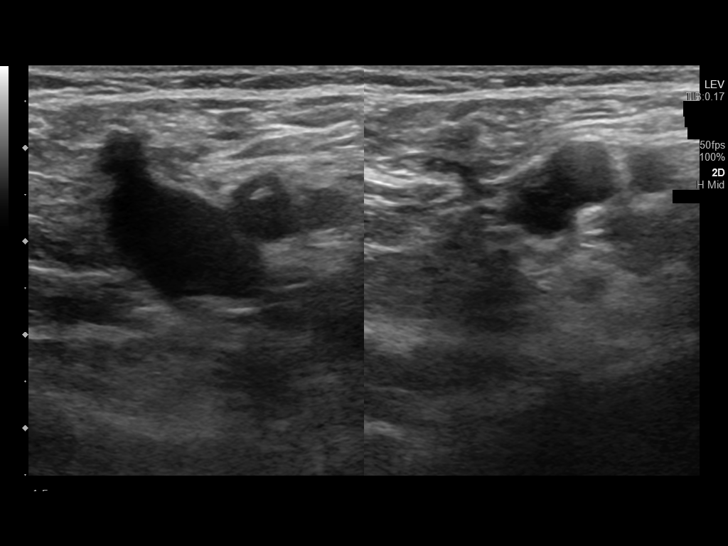
[im 11/34]
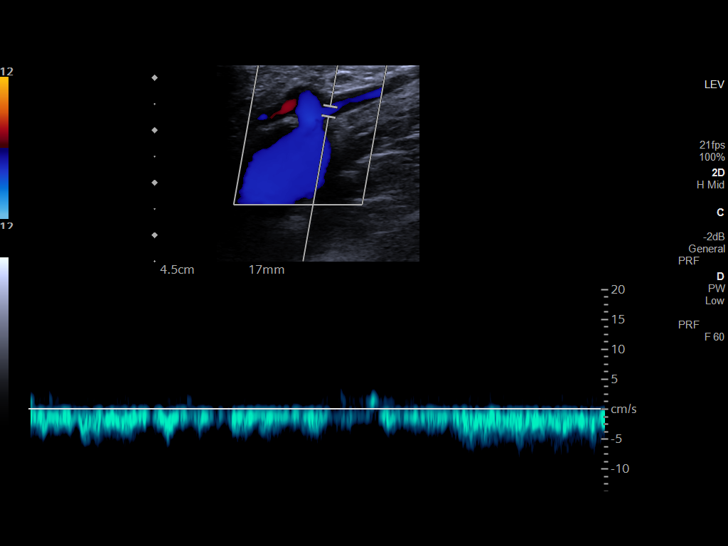
[im 13/34]
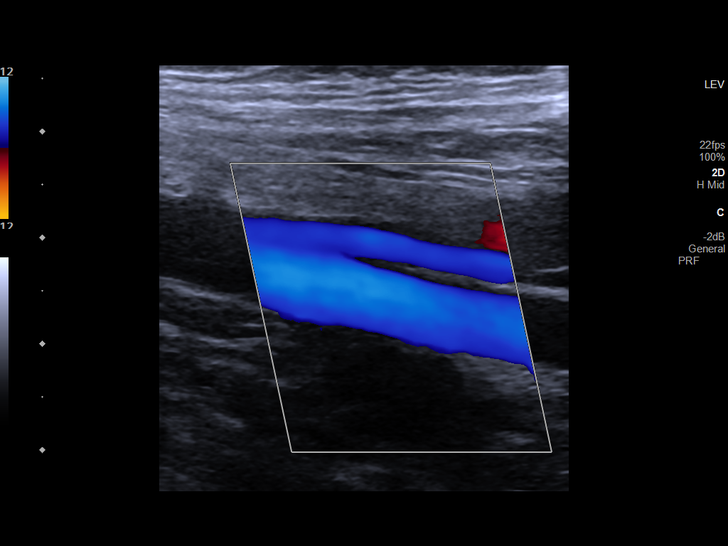
[im 16/34]
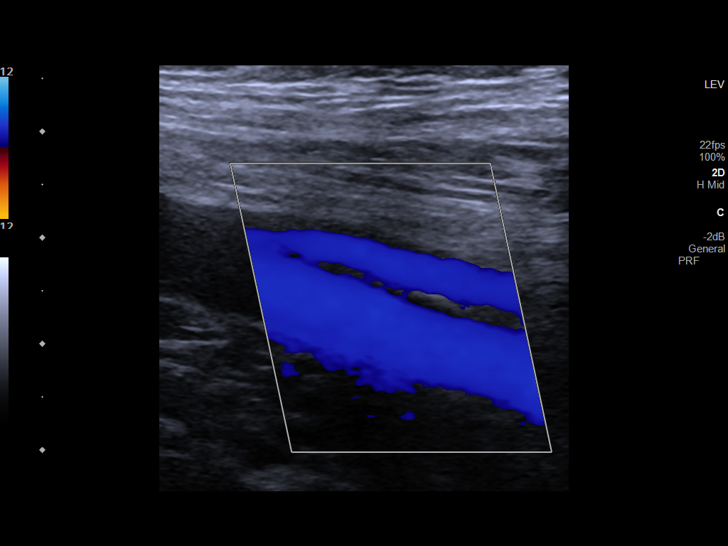
[im 18/34]
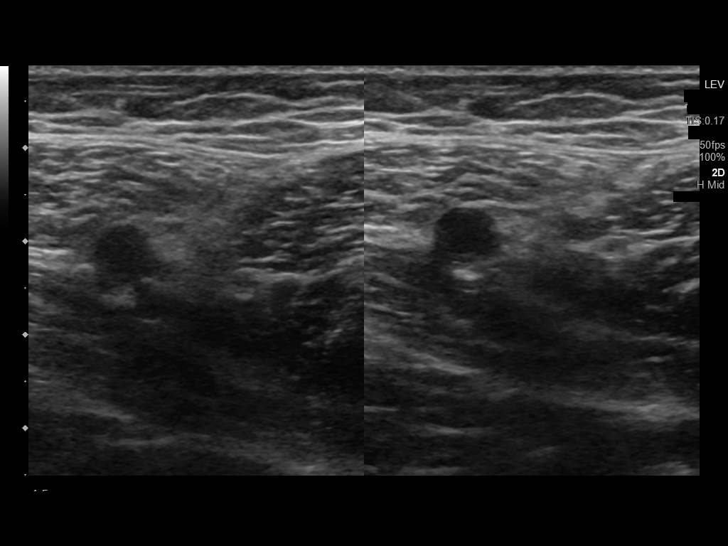
[im 21/34]
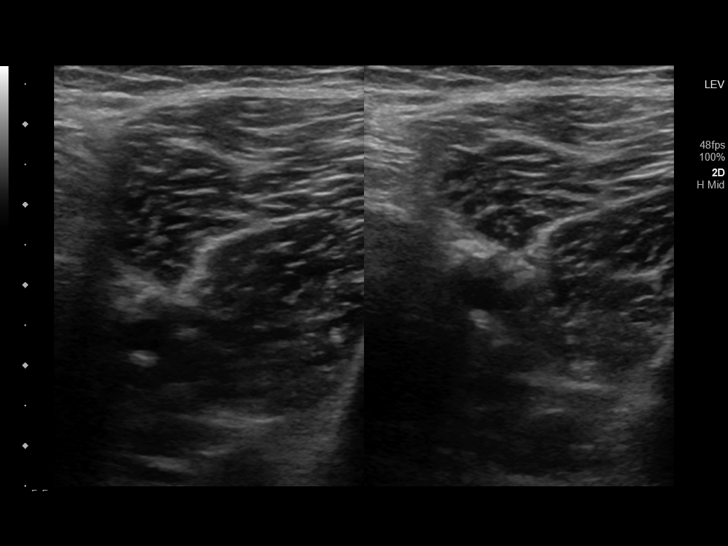
[im 23/34]
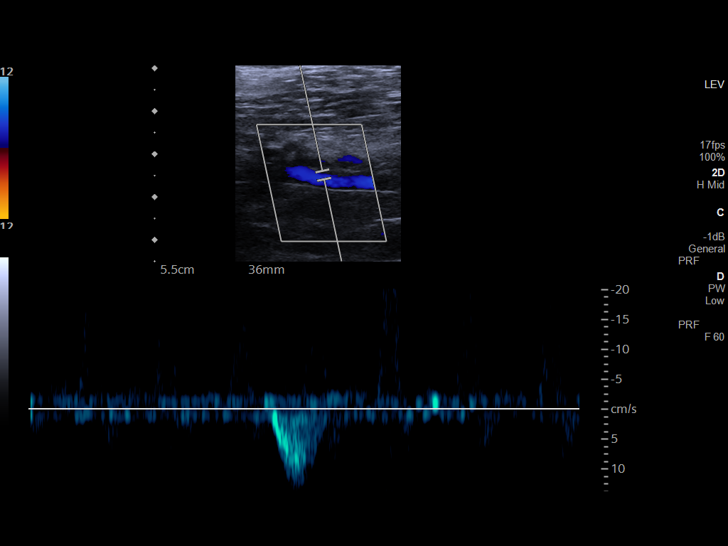
[im 26/34]
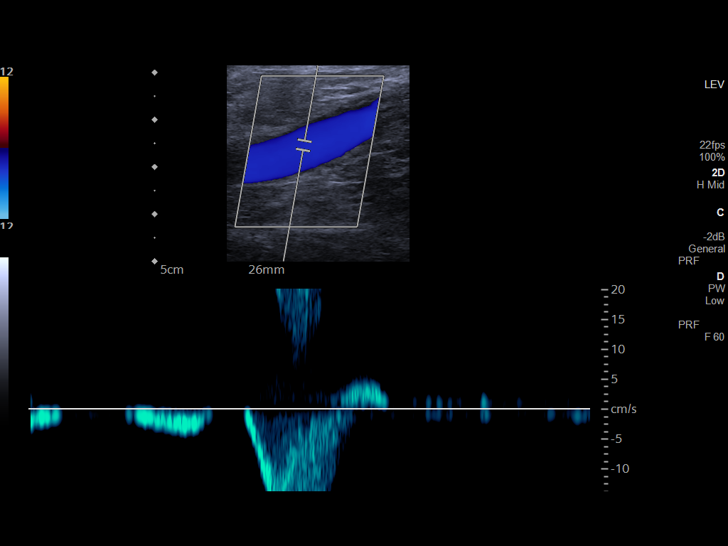
[im 28/34]
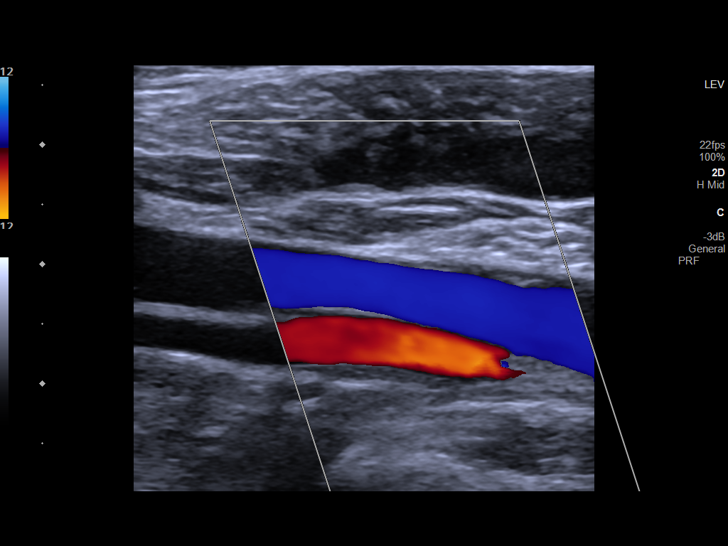
[im 31/34]
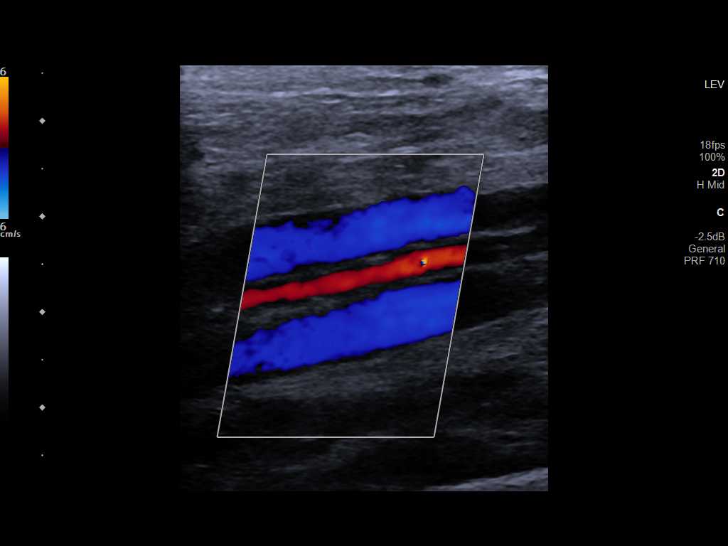
[im 34/34]
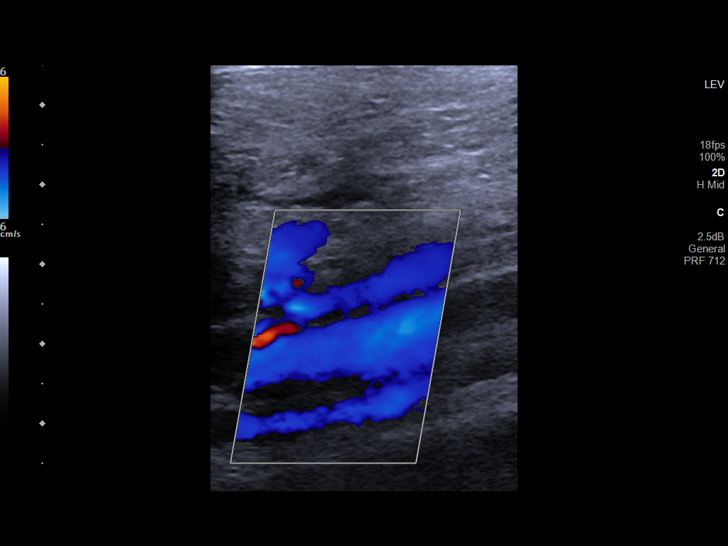

[14 of 24 positions shown; findings below may reference images not displayed]

FINDINGS: VENOUS

Normal compressibility of the common femoral, superficial femoral,
and popliteal veins, as well as the visualized calf veins.
Visualized portions of profunda femoral vein and great saphenous
vein unremarkable. No filling defects to suggest DVT on grayscale or
color Doppler imaging. Doppler waveforms show normal direction of
venous flow, normal respiratory plasticity and response to
augmentation.

Limited views of the contralateral common femoral vein are
unremarkable.

OTHER

None.

Limitations: none
IMPRESSION: Negative.

## 2022-05-28 IMAGING — MR MR LUMBAR SPINE W/O CM
5 series · 32 of 48 positions shown · non-contrast
Comparison: Lumbar spine radiographs 08/13/2021

CLINICAL DATA: Low back and left leg pain.

EXAM:
MRI LUMBAR SPINE WITHOUT CONTRAST
TECHNIQUE: Multiplanar, multisequence MR imaging of the lumbar spine was
performed. No intravenous contrast was administered.

[Series 5: T2 · sagittal · 4.0mm · 0.81mm/px · 7 of 17 slices shown (1 of 2)]
[im 1/17]
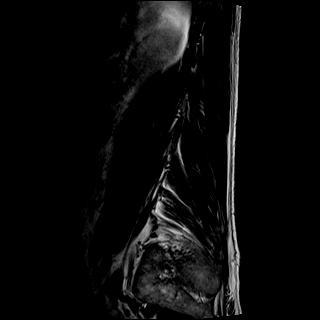
[im 3/17]
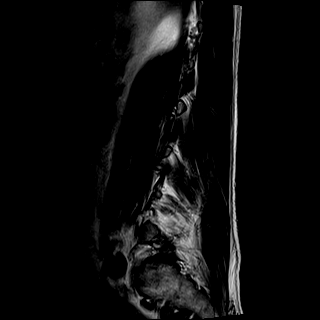
[im 6/17]
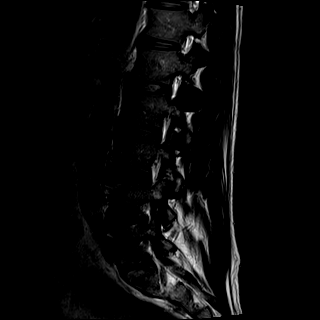
[im 9/17]
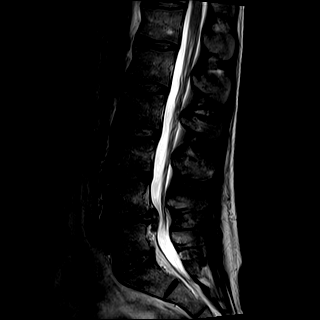
[im 11/17]
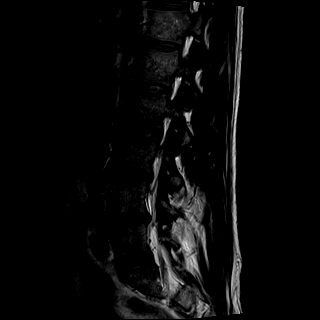
[im 14/17]
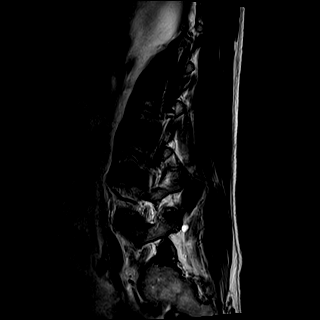
[im 17/17]
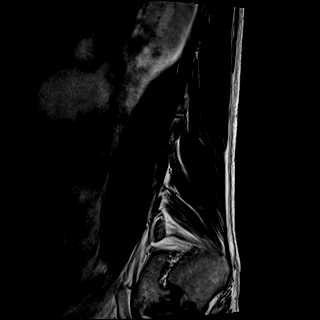

[Series 6: T1 · sagittal · 4.0mm · 0.81mm/px · 7 of 17 slices shown (1 of 2)]
[im 1/17]
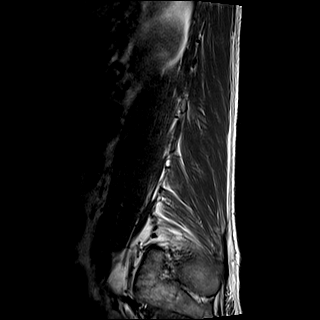
[im 3/17]
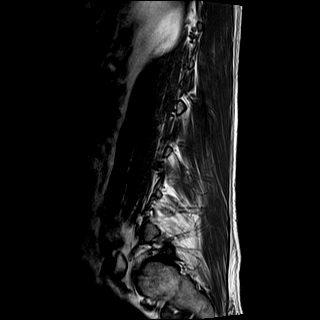
[im 6/17]
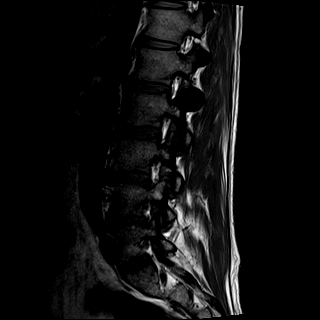
[im 9/17]
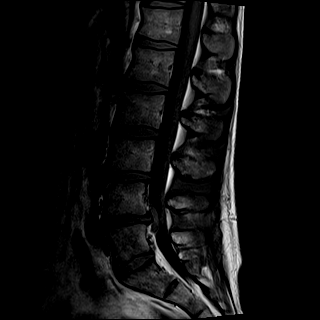
[im 11/17]
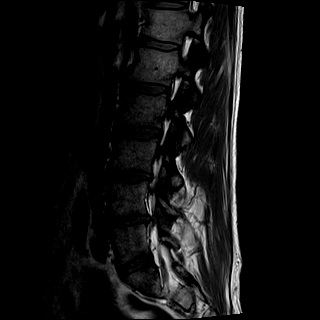
[im 14/17]
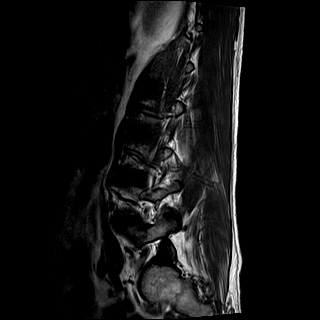
[im 17/17]
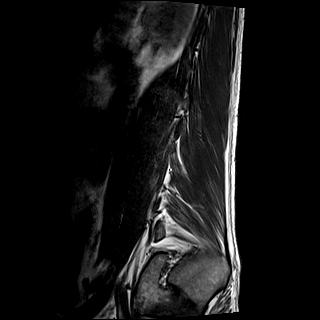

[Series 7: STIR · sagittal · 4.0mm · 0.41mm/px · 2 of 17 slices shown]
[im 1/17]
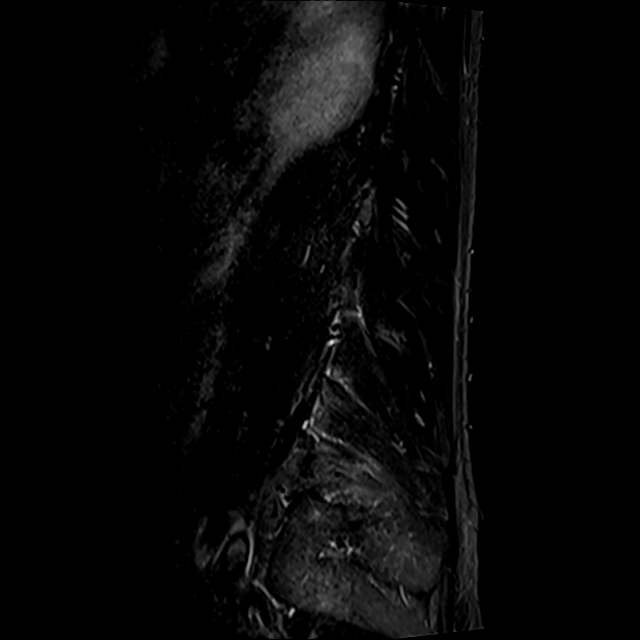
[im 4/17]
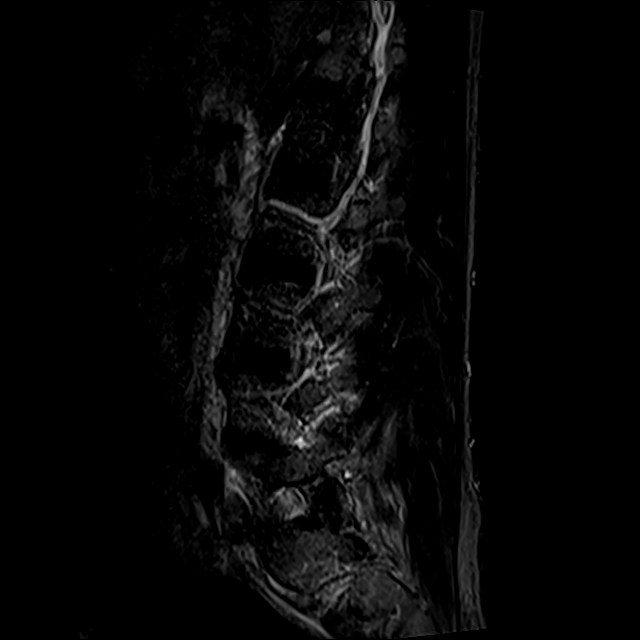

[Series 8: T2 · axial · 4.0mm · 0.78mm/px · z∈[-136,+79]mm · 8 of 38 slices shown (2 of 2)]
[im 1/38]
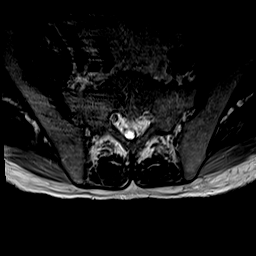
[im 6/38]
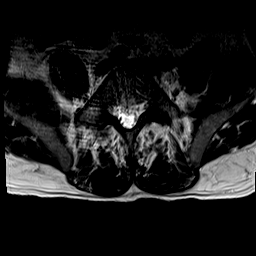
[im 12/38]
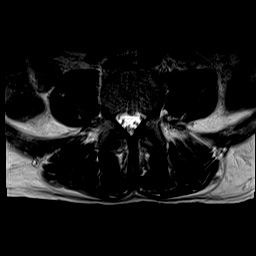
[im 18/38]
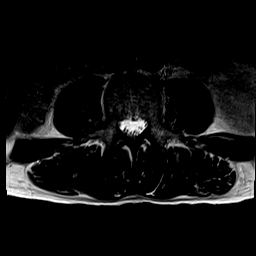
[im 20/38]
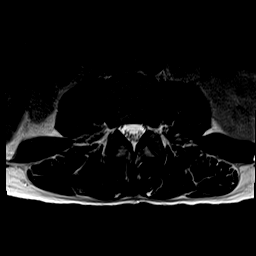
[im 26/38]
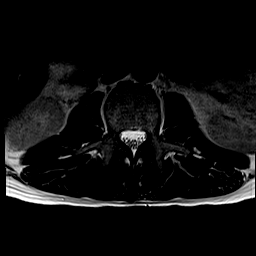
[im 32/38]
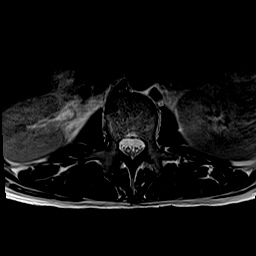
[im 38/38]
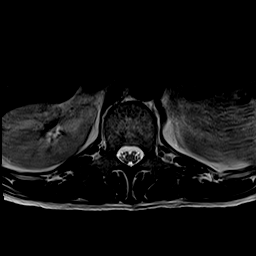

[Series 9: T1 · axial · 4.0mm · 0.39mm/px · z∈[-136,+79]mm · 8 of 38 slices shown (2 of 2)]
[im 1/38]
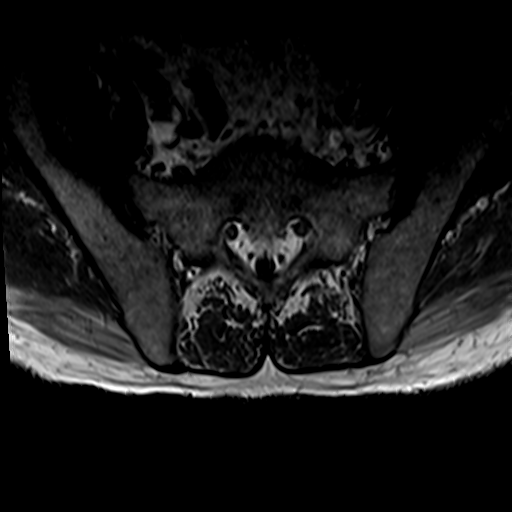
[im 6/38]
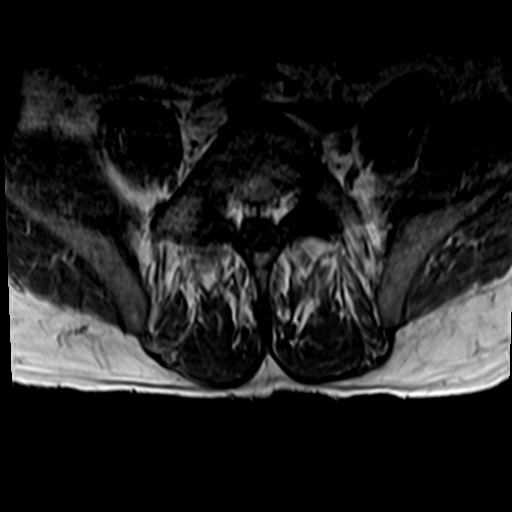
[im 12/38]
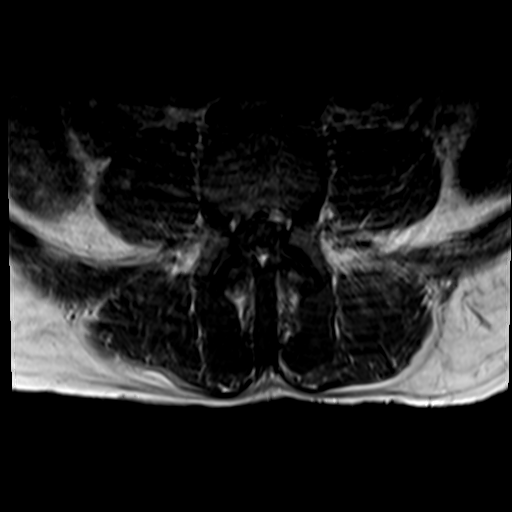
[im 18/38]
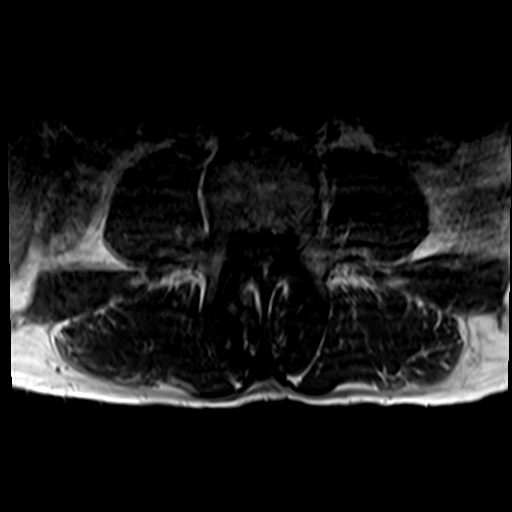
[im 20/38]
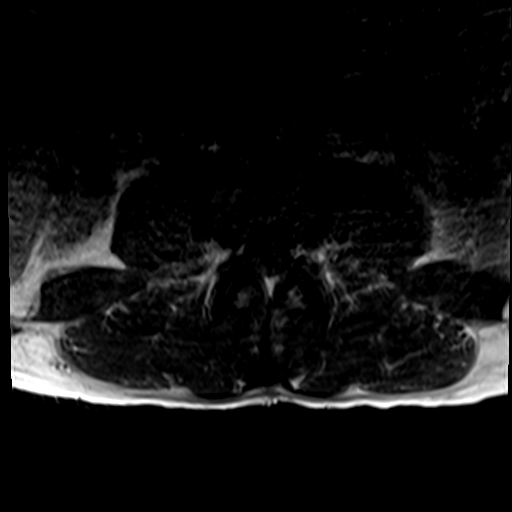
[im 26/38]
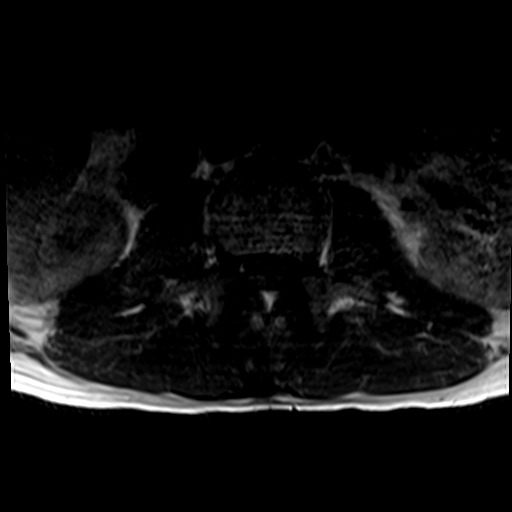
[im 32/38]
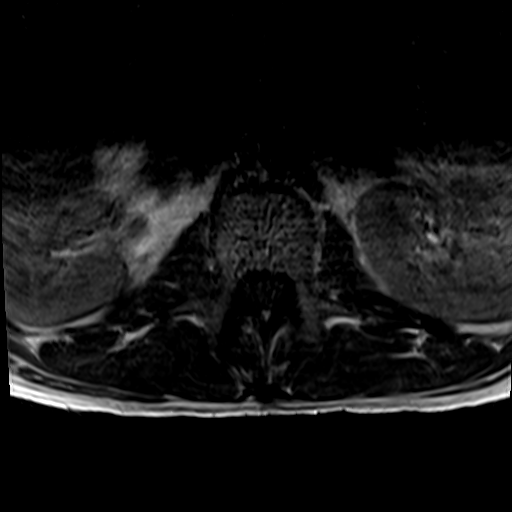
[im 38/38]
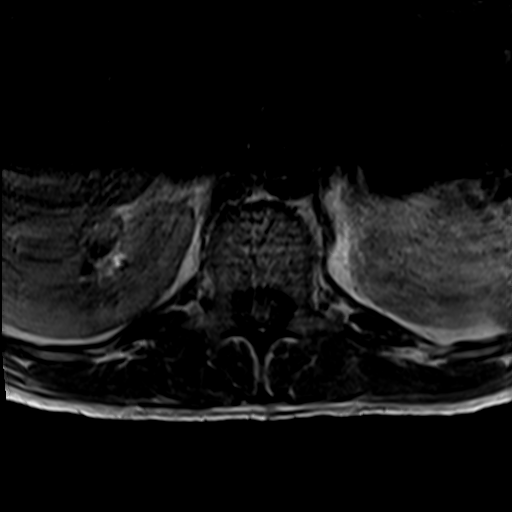

[32 of 48 positions shown; findings below may reference images not displayed]

FINDINGS: Segmentation: Based on the radiographs and a prior CT scan of the
abdomen/pelvis there is transitional lumbar anatomy with
lumbarization of S1 and a full disc space at S1-2.

Alignment:  Normal

Vertebrae:  Normal marrow signal.  No bone lesions or fractures.

Conus medullaris and cauda equina: Conus extends to the L1 level.
Conus and cauda equina appear normal.

Paraspinal and other soft tissues: No significant paraspinal or
retroperitoneal findings.

Disc levels:

No significant findings at L1-2, L2-3, L3-4 or L4-5.

L5-S1: Moderate-sized disc protrusion with what appears to be some
surrounding hematoma. This is asymmetric left and there is
significant mass effect on the left side of the thecal sac and
involvement of the left S1 nerve root. No foraminal stenosis.

S1-2: No significant findings.
IMPRESSION: 1. Transitional lumbar anatomy with lumbarization of S1 and a full
disc space at S1-2.
2. Moderate-sized disc protrusion at L5-S1 asymmetric left with
significant mass effect on the left side of the thecal sac and
involvement of the left S1 nerve root. Suspect some associated
surrounding hematoma.

## 2022-05-29 ENCOUNTER — Ambulatory Visit: Payer: Medicaid Other | Admitting: Gastroenterology

## 2022-06-19 DIAGNOSIS — Z419 Encounter for procedure for purposes other than remedying health state, unspecified: Secondary | ICD-10-CM | POA: Diagnosis not present

## 2022-06-22 ENCOUNTER — Other Ambulatory Visit: Payer: Self-pay | Admitting: Internal Medicine

## 2022-06-22 DIAGNOSIS — R112 Nausea with vomiting, unspecified: Secondary | ICD-10-CM

## 2022-07-20 DIAGNOSIS — Z419 Encounter for procedure for purposes other than remedying health state, unspecified: Secondary | ICD-10-CM | POA: Diagnosis not present

## 2022-08-17 DIAGNOSIS — M7551 Bursitis of right shoulder: Secondary | ICD-10-CM | POA: Diagnosis not present

## 2022-08-17 DIAGNOSIS — Z6822 Body mass index (BMI) 22.0-22.9, adult: Secondary | ICD-10-CM | POA: Diagnosis not present

## 2022-08-17 DIAGNOSIS — Z1389 Encounter for screening for other disorder: Secondary | ICD-10-CM | POA: Diagnosis not present

## 2022-08-17 DIAGNOSIS — M5030 Other cervical disc degeneration, unspecified cervical region: Secondary | ICD-10-CM | POA: Diagnosis not present

## 2022-08-18 DIAGNOSIS — Z419 Encounter for procedure for purposes other than remedying health state, unspecified: Secondary | ICD-10-CM | POA: Diagnosis not present

## 2022-09-18 DIAGNOSIS — Z419 Encounter for procedure for purposes other than remedying health state, unspecified: Secondary | ICD-10-CM | POA: Diagnosis not present

## 2022-10-18 DIAGNOSIS — Z419 Encounter for procedure for purposes other than remedying health state, unspecified: Secondary | ICD-10-CM | POA: Diagnosis not present

## 2022-11-18 DIAGNOSIS — Z419 Encounter for procedure for purposes other than remedying health state, unspecified: Secondary | ICD-10-CM | POA: Diagnosis not present

## 2022-12-18 DIAGNOSIS — Z419 Encounter for procedure for purposes other than remedying health state, unspecified: Secondary | ICD-10-CM | POA: Diagnosis not present

## 2022-12-20 DIAGNOSIS — Z6821 Body mass index (BMI) 21.0-21.9, adult: Secondary | ICD-10-CM | POA: Diagnosis not present

## 2022-12-20 DIAGNOSIS — L237 Allergic contact dermatitis due to plants, except food: Secondary | ICD-10-CM | POA: Diagnosis not present

## 2023-01-18 DIAGNOSIS — Z419 Encounter for procedure for purposes other than remedying health state, unspecified: Secondary | ICD-10-CM | POA: Diagnosis not present

## 2023-02-18 DIAGNOSIS — Z419 Encounter for procedure for purposes other than remedying health state, unspecified: Secondary | ICD-10-CM | POA: Diagnosis not present

## 2023-03-20 DIAGNOSIS — Z419 Encounter for procedure for purposes other than remedying health state, unspecified: Secondary | ICD-10-CM | POA: Diagnosis not present

## 2023-04-20 DIAGNOSIS — Z419 Encounter for procedure for purposes other than remedying health state, unspecified: Secondary | ICD-10-CM | POA: Diagnosis not present

## 2023-05-20 DIAGNOSIS — Z419 Encounter for procedure for purposes other than remedying health state, unspecified: Secondary | ICD-10-CM | POA: Diagnosis not present

## 2023-06-20 DIAGNOSIS — Z419 Encounter for procedure for purposes other than remedying health state, unspecified: Secondary | ICD-10-CM | POA: Diagnosis not present

## 2023-07-21 DIAGNOSIS — Z419 Encounter for procedure for purposes other than remedying health state, unspecified: Secondary | ICD-10-CM | POA: Diagnosis not present

## 2023-08-18 DIAGNOSIS — Z419 Encounter for procedure for purposes other than remedying health state, unspecified: Secondary | ICD-10-CM | POA: Diagnosis not present

## 2023-09-29 DIAGNOSIS — Z419 Encounter for procedure for purposes other than remedying health state, unspecified: Secondary | ICD-10-CM | POA: Diagnosis not present

## 2023-10-29 DIAGNOSIS — Z419 Encounter for procedure for purposes other than remedying health state, unspecified: Secondary | ICD-10-CM | POA: Diagnosis not present

## 2023-11-29 DIAGNOSIS — Z419 Encounter for procedure for purposes other than remedying health state, unspecified: Secondary | ICD-10-CM | POA: Diagnosis not present

## 2023-12-19 ENCOUNTER — Encounter (HOSPITAL_COMMUNITY): Payer: Self-pay

## 2023-12-19 ENCOUNTER — Inpatient Hospital Stay (HOSPITAL_COMMUNITY)
Admission: AD | Admit: 2023-12-19 | Discharge: 2023-12-22 | DRG: 201 | Disposition: A | Discharge status: Home / Self Care | Source: Other Acute Inpatient Hospital | Attending: Internal Medicine | Admitting: Internal Medicine

## 2023-12-19 DIAGNOSIS — I10 Essential (primary) hypertension: Secondary | ICD-10-CM | POA: Diagnosis not present

## 2023-12-19 DIAGNOSIS — Z4682 Encounter for fitting and adjustment of non-vascular catheter: Secondary | ICD-10-CM | POA: Diagnosis not present

## 2023-12-19 DIAGNOSIS — J930 Spontaneous tension pneumothorax: Principal | ICD-10-CM | POA: Diagnosis present

## 2023-12-19 DIAGNOSIS — J9811 Atelectasis: Secondary | ICD-10-CM | POA: Diagnosis not present

## 2023-12-19 DIAGNOSIS — J939 Pneumothorax, unspecified: Principal | ICD-10-CM

## 2023-12-19 DIAGNOSIS — R0902 Hypoxemia: Secondary | ICD-10-CM | POA: Diagnosis present

## 2023-12-19 DIAGNOSIS — F17210 Nicotine dependence, cigarettes, uncomplicated: Secondary | ICD-10-CM | POA: Diagnosis not present

## 2023-12-19 DIAGNOSIS — J449 Chronic obstructive pulmonary disease, unspecified: Secondary | ICD-10-CM

## 2023-12-19 DIAGNOSIS — J439 Emphysema, unspecified: Secondary | ICD-10-CM | POA: Diagnosis not present

## 2023-12-19 DIAGNOSIS — Z825 Family history of asthma and other chronic lower respiratory diseases: Secondary | ICD-10-CM

## 2023-12-19 DIAGNOSIS — Z716 Tobacco abuse counseling: Secondary | ICD-10-CM

## 2023-12-19 DIAGNOSIS — G4733 Obstructive sleep apnea (adult) (pediatric): Secondary | ICD-10-CM | POA: Diagnosis present

## 2023-12-19 DIAGNOSIS — M109 Gout, unspecified: Secondary | ICD-10-CM | POA: Diagnosis present

## 2023-12-19 DIAGNOSIS — Z79899 Other long term (current) drug therapy: Secondary | ICD-10-CM

## 2023-12-19 DIAGNOSIS — Z801 Family history of malignant neoplasm of trachea, bronchus and lung: Secondary | ICD-10-CM | POA: Diagnosis not present

## 2023-12-19 DIAGNOSIS — K219 Gastro-esophageal reflux disease without esophagitis: Secondary | ICD-10-CM | POA: Diagnosis not present

## 2023-12-19 DIAGNOSIS — R079 Chest pain, unspecified: Secondary | ICD-10-CM | POA: Diagnosis not present

## 2023-12-19 DIAGNOSIS — J9383 Other pneumothorax: Secondary | ICD-10-CM | POA: Diagnosis not present

## 2023-12-19 DIAGNOSIS — R0602 Shortness of breath: Secondary | ICD-10-CM | POA: Diagnosis not present

## 2023-12-19 DIAGNOSIS — Z720 Tobacco use: Secondary | ICD-10-CM | POA: Diagnosis not present

## 2023-12-19 DIAGNOSIS — J9311 Primary spontaneous pneumothorax: Secondary | ICD-10-CM | POA: Diagnosis not present

## 2023-12-19 LAB — CBC WITH DIFFERENTIAL/PLATELET
Abs Immature Granulocytes: 0.05 10*3/uL (ref 0.00–0.07)
Basophils Absolute: 0 10*3/uL (ref 0.0–0.1)
Basophils Relative: 0 %
Eosinophils Absolute: 0 10*3/uL (ref 0.0–0.5)
Eosinophils Relative: 0 %
HCT: 42.7 % (ref 39.0–52.0)
Hemoglobin: 14.8 g/dL (ref 13.0–17.0)
Immature Granulocytes: 0 %
Lymphocytes Relative: 11 %
Lymphs Abs: 1.4 10*3/uL (ref 0.7–4.0)
MCH: 31.3 pg (ref 26.0–34.0)
MCHC: 34.7 g/dL (ref 30.0–36.0)
MCV: 90.3 fL (ref 80.0–100.0)
Monocytes Absolute: 0.4 10*3/uL (ref 0.1–1.0)
Monocytes Relative: 3 %
Neutro Abs: 11 10*3/uL — ABNORMAL HIGH (ref 1.7–7.7)
Neutrophils Relative %: 86 %
Platelets: 277 10*3/uL (ref 150–400)
RBC: 4.73 MIL/uL (ref 4.22–5.81)
RDW: 13 % (ref 11.5–15.5)
WBC: 12.8 10*3/uL — ABNORMAL HIGH (ref 4.0–10.5)
nRBC: 0 % (ref 0.0–0.2)

## 2023-12-19 MED ORDER — OXYCODONE HCL 5 MG PO TABS
10.0000 mg | ORAL_TABLET | ORAL | Status: DC | PRN
  Administered 2023-12-19 – 2023-12-20 (×4): 10 mg via ORAL
  Filled 2023-12-19 (×4): qty 2

## 2023-12-19 MED ORDER — ACETAMINOPHEN 325 MG PO TABS: 650.0000 mg | ORAL_TABLET | Freq: Four times a day (QID) | ORAL | Status: DC | PRN

## 2023-12-19 MED ORDER — ONDANSETRON HCL 4 MG/2ML IJ SOLN: 4.0000 mg | Freq: Four times a day (QID) | INTRAMUSCULAR | Status: DC | PRN

## 2023-12-19 MED ORDER — HYDROMORPHONE HCL 1 MG/ML IJ SOLN
1.0000 mg | INTRAMUSCULAR | Status: AC | PRN
  Administered 2023-12-19 (×2): 1 mg via INTRAVENOUS
  Filled 2023-12-19 (×2): qty 1

## 2023-12-19 MED ORDER — ALBUTEROL SULFATE (2.5 MG/3ML) 0.083% IN NEBU
2.5000 mg | INHALATION_SOLUTION | RESPIRATORY_TRACT | Status: DC | PRN
  Administered 2023-12-19 – 2023-12-22 (×8): 2.5 mg via RESPIRATORY_TRACT
  Filled 2023-12-19 (×8): qty 3

## 2023-12-19 MED ORDER — ENOXAPARIN SODIUM 40 MG/0.4ML IJ SOSY
40.0000 mg | PREFILLED_SYRINGE | INTRAMUSCULAR | Status: DC
  Administered 2023-12-19 – 2023-12-21 (×3): 40 mg via SUBCUTANEOUS
  Filled 2023-12-19 (×3): qty 0.4

## 2023-12-19 MED ORDER — NICOTINE 21 MG/24HR TD PT24
21.0000 mg | MEDICATED_PATCH | Freq: Every day | TRANSDERMAL | Status: DC
  Administered 2023-12-20 – 2023-12-22 (×3): 21 mg via TRANSDERMAL
  Filled 2023-12-19 (×3): qty 1

## 2023-12-19 MED ORDER — ONDANSETRON HCL 4 MG PO TABS: 4.0000 mg | ORAL_TABLET | Freq: Four times a day (QID) | ORAL | Status: DC | PRN

## 2023-12-19 MED ORDER — ACETAMINOPHEN 650 MG RE SUPP: 650.0000 mg | Freq: Four times a day (QID) | RECTAL | Status: DC | PRN

## 2023-12-19 NOTE — ED Notes (Signed)
 RT on standby for chest tube placement, SpO2 is 99%, RR 16, HR 91. Post chest tube,insertion, patient is resting at this time.

## 2023-12-19 NOTE — ED Provider Notes (Signed)
 [facility] EMERGENCY DEPARTMENT ENCOUNTER     CLINICAL IMPRESSION Final diagnoses:  Tension pneumothorax, spontaneous (Primary)     Differential Diagnoses, Medical Decision Making, Progress Notes, Procedures and Critical Care   Albert Hill is a 48 y.o. male with a past medical history of tobacco use disorder and hypertension who presents with shortness of breath.  Differential diagnoses considered on initial evaluation include but are not limited to COPD exacerbation, pneumonia, pneumothorax, pulmonary embolism.  On arrival, patient's oxygen  saturation was 83%.  He came up to 89% on 10 L.  He was given Solu-Medrol  125 mg IV and a DuoNeb treatment.  A chest x-ray was obtained which showed a large right pneumothorax with tension physiology.  After obtaining consent, patient received conscious sedation with Versed  and fentanyl .  An 8.5 French pigtail catheter was placed in the right mid axillary line.  Repeat chest x-ray showed decrease in size of the right pneumothorax which is now small.  I am turning the patient's care over to Dr. Hubert at shift change.       Independent interpretation: EKG(s) - sinus tachycardia at 206 bpm.  No acute ST or T wave changes noted. X-ray(s) - chest x-ray shows a large right pneumothorax   Social Determinants that significantly affected care: Difficulty affording meds     ED Course as of 12/19/23 0748  Wed Dec 19, 2023  0748 WBC(!): 13.0  0748 Glucose(!): 110     Chest Tube  Date/Time: 12/19/2023 7:31 AM  Performed by: Cristopher Leita Caldron, MD Authorized by: Cristopher Leita Caldron, MD   Consent:    Consent obtained:  Written   Consent given by:  Patient   Risks, benefits, and alternatives were discussed: yes     Risks discussed:  Bleeding, damage to surrounding structures, pain, infection, incomplete drainage and nerve damage Universal protocol:    Procedure explained and questions answered to patient or proxy's satisfaction: yes      Relevant documents present and verified: yes     Test results available: yes     Imaging studies available: yes     Required blood products, implants, devices, and special equipment available: yes     Site/side marked: yes     Immediately prior to procedure, a time out was called: yes     Patient identity confirmed:  Verbally with patient Pre-procedure details:    Skin preparation:  Chlorhexidine    Preparation: Patient was prepped and draped in the usual sterile fashion   Sedation:    Sedation type:  Moderate sedation Anesthesia:    Anesthesia method:  Local infiltration   Local anesthetic:  Lidocaine  2% WITH epi Procedure details:    Placement location:  R lateral   Scalpel size:  11   Tube size (Fr):  8   Ultrasound guidance: no     Tension pneumothorax: yes     Tube connected to:  Water seal   Drainage characteristics:  Air only   Suture material:  2-0 silk   Dressing:  Petrolatum-impregnated gauze Post-procedure details:    Post-insertion x-ray findings: tube in good position     Procedure completion:  Tolerated well, no immediate complications ED Sedation  Date/Time: 12/19/2023 7:33 AM  Performed by: Cristopher Leita Caldron, MD Authorized by: Cristopher Leita Caldron, MD  Consent: Verbal consent obtained. Written consent obtained Risks and benefits: risks, benefits and alternatives were discussed Consent given by: patient Patient understanding: patient states understanding of the procedure being performed Patient consent: the patient's understanding of  the procedure matches consent given Procedure consent: procedure consent matches procedure scheduled Relevant documents: relevant documents present and verified Test results: test results available and properly labeled Site marked: the operative site was marked Imaging studies: imaging studies available Required items: required blood products, implants, devices, and special equipment available Patient identity confirmed: verbally with  patient Time out: Immediately prior to procedure a time out was called to verify the correct patient, procedure, equipment, support staff and site/side marked as required.  Pre-sedation assessment:    Time since last food or drink:  12 hours   ASA classification: class 2 - patient with mild systemic disease     Neck mobility: normal     Mouth opening:  2 finger widths   Thyromental distance:  3 finger widths   Mallampati score:  II - soft palate, uvula, fauces visible   Pre-sedation assessments completed and reviewed: airway patency, cardiovascular function, hydration status, mental status, nausea/vomiting, pain level, respiratory function and temperature     History of difficult intubation: no   Sedation: Patient sedated: yes Sedation type: moderate (conscious) sedation Sedatives: midazolam  Analgesia: fentanyl  Vitals: Vital signs were monitored during sedation.   Critical Care  Performed by: Cristopher Leita Caldron, MD Authorized by: Cristopher Leita Caldron, MD   Critical care provider statement:    Critical care time (minutes):  60   Critical care was necessary to treat or prevent imminent or life-threatening deterioration of the following conditions:  Respiratory failure   Critical care was time spent personally by me on the following activities:  Blood draw for specimens, development of treatment plan with patient or surrogate, evaluation of patient's response to treatment, examination of patient, obtaining history from patient or surrogate, ordering and performing treatments and interventions, ordering and review of laboratory studies, ordering and review of radiographic studies, pulse oximetry, re-evaluation of patient's condition and review of old charts    _______________________________________________________________  Time seen: December 19, 2023 5:08 AM  I have reviewed the triage vital signs and the nursing notes.  CHIEF COMPLAINT   Chief Complaint  Patient presents with  .  Shortness of Breath    HPI  Albert Hill is a 48 y.o. male with a past medical history of tobacco use and hypertension who presents with shortness of breath.  He said that he has been developing shortness of breath over the past couple of weeks.  He woke up this morning and could not get his breath at all.  He has not had a fever or chills or any signs of illness.  He is coughing up some green phlegm.  He smokes 1 pack of cigarettes per day.  He tried using his albuterol  inhaler at home without any relief.  He has a nebulizer but he is out of the medication for it.  PAST MEDICAL HISTORY   Past Medical History[1]  SURGICAL HISTORY   Past Surgical History[2]  CURRENT MEDICATIONS    Current Facility-Administered Medications:  .  fentaNYL  (PF) (SUBLIMAZE ) injection 50 mcg, 50 mcg, Intravenous, Once, Hester, Leita Caldron, MD .  sodium chloride  0.9% (NS) bolus 1,000 mL, 1,000 mL, Intravenous, Once, Hester, Leita Caldron, MD No current outpatient medications on file.  ALLERGIES   Allergies[3]  FAMILY HISTORY   Family History[4]  SOCIAL HISTORY Social History[5]   PHYSICAL EXAM    VITAL SIGNS:   ED Triage Vitals  Enc Vitals Group     BP      Pulse      SpO2 Pulse  Resp      Temp      Temp src      SpO2      Weight      Height      Head Circumference      Peak Flow      Pain Score      Pain Loc      Pain Education      Exclude from Growth Chart     Constitutional: Alert and oriented.  Appears uncomfortable. Eyes: Conjunctivae are normal. ENT      Head: Normocephalic and atraumatic.      Nose: No congestion.      Mouth/Throat: Mucous membranes are moist.      Neck: No stridor. Hematological/Lymphatic/Immunilogical: No cervical lymphadenopathy. Cardiovascular: Normal rate, regular rhythm.  Respiratory: Diminished breath sounds in all lung fields. Gastrointestinal: Soft and nontender. There is no CVA tenderness. Musculoskeletal: No tenderness to palpation in  the lower extremities.  No edema. Neurologic: Normal speech and language. No gross focal neurologic deficits are appreciated. Skin: Skin is warm, dry and intact. No rash noted. Psychiatric: Mood and affect are normal. Speech and behavior are normal.   RADIOLOGY   XR Chest Portable Result Date: 12/19/2023 EXAM: XR CHEST PORTABLE ACCESSION: [id number] [facility] REPORT DATE: 12/19/2023 7:26 AM CLINICAL INDICATION: PNEUMOTHORAX  TECHNIQUE: Single View AP Chest Radiograph. COMPARISON: Same day chest radiographs FINDINGS: Interval placement of right pigtail pleural catheter. Decreased size of the right hemothorax, now small. Lucency of the left superior lung. Unchanged cardiomediastinal silhouette.   Interval placement of right pigtail pleural catheter with decrease size of the right pneumothorax, now small.   XR Chest Portable Result Date: 12/19/2023 EXAM: XR CHEST PORTABLE ACCESSION: [id number] [facility] REPORT DATE: 12/19/2023 5:45 AM CLINICAL INDICATION: CHEST PAIN  TECHNIQUE: Single View AP Chest Radiograph. COMPARISON: None FINDINGS: Large right pneumothorax with near complete collapse of the right lung. There is no mediastinal shift but there is mild splaying of the right ribs. The left lung is clear. Normal heart size and mediastinal contours.   Large right pneumothorax with mild splaying of the right ribs suggestive of tension physiology. ++++++++++++++++++++ The findings of this study were discussed via epic secure chat with receipt with DR. LAURA ANN HESTER by Dr. Renay Mealing on 12/19/2023 5:49 AM. -----------------------------------------------   LABS Labs Reviewed  COMPREHENSIVE METABOLIC PANEL - Abnormal; Notable for the following components:      Result Value   Glucose 110 (*)    All other components within normal limits  CBC W/ AUTO DIFF - Abnormal; Notable for the following components:   WBC 13.0 (*)    Absolute Neutrophils 8.1 (*)    Absolute Eosinophils 0.6 (*)    All other components within  normal limits  TROPONIN I - Normal  PROTIME-INR - Normal   Narrative:    Reference Interval is for non-anticoagulated patients.                 Suggested INR Therapeutic Ranges:                 Moderate Intensity Therapy   2.0 - 3.0                 High Intensity Therapy     2.5 - 3.5  CBC W/ DIFFERENTIAL   Narrative:    The following orders were created for panel order CBC w/ Differential.                 Procedure  Abnormality         Status                                    ---------                               -----------         ------                                    CBC w/ Differential[[id number]]         Abnormal            Final result                                               Please view results for these tests on the individual orders.       ED COURSE Pertinent labs & imaging results that were available during my care of the patient were reviewed by me (see chart for details).   ATTESTATIONS   Portions of this record have been created using Dragon dictation software. Dictation errors have been sought, but may not have been identified and corrected.     Cristopher Leita Caldron, MD 12/19/23 [phone]      [1] No past medical history on file. [2] No past surgical history on file. [3] No Known Allergies [4] No family history on file. [5] Social History Tobacco Use  . Smoking status: Every Day    Current packs/day: 1.00    Types: Cigarettes   Social Drivers of Corporate investment banker Strain: Low Risk  (01/15/2018)   Received from [facility]   Overall Financial Resource Strain (CARDIA)   . Difficulty of Paying Living Expenses: Not hard at all  Food Insecurity: No Food Insecurity (01/15/2018)   Received from [facility]   Hunger Vital Sign   . Worried About Programme researcher, broadcasting/film/video in the Last Year: Never true   . Ran Out of Food in the Last Year: Never true  Transportation Needs: No Transportation Needs (01/15/2018)    Received from [facility] - Transportation   . Lack of Transportation (Medical): No   . Lack of Transportation (Non-Medical): No  Physical Activity: Sufficiently Active (01/15/2018)   Received from [facility]   Exercise Vital Sign   . Days of Exercise per Week: 4 days   . Minutes of Exercise per Session: 150+ min  Stress: Stress Concern Present (01/15/2018)   Received from [facility] - Occupational Stress Questionnaire   . Feeling of Stress : Very much  Social Connections: Somewhat Isolated (01/15/2018)   Received from [facility]   Social Connection and Isolation Panel   . Frequency of Communication with Friends and Family: More than three times a week   . Frequency of Social Gatherings with Friends and Family: More than three times a week   . Attends Religious Services: 1 to 4 times per year   . Active Member of Clubs or Organizations: No   . Attends Banker Meetings: Never   . Marital Status:  Divorced

## 2023-12-19 NOTE — H&P (Signed)
 History and Physical    Albert Hill FMW:[id number] DOB: 1975-06-25 DOA: 12/19/2023  I have briefly reviewed the patient's prior medical records in [facility]  PCP: Darra Hamilton, [place]  Patient coming from: home  Chief Complaint: Shortness of breath  HPI: Albert Hill is a 48 y.o. male with medical history significant of tobacco use, HTN, comes into the hospital with severe shortness of breath.  He has been feeling intermittently short of breath for the past 2 weeks but was able to go to work, be active without major issues, and shortness of breath has gotten maybe slightly worse in the last couple of days.  When he woke up this morning he felt really short of breath and decided to go to the emergency room, and went to [facility] ER.  He was found to be hypoxic requiring 10 L of oxygen , and a chest x-ray showed right-sided pneumothorax.  He had a chest tube placed in the ER and he was transferred to [facility] for cardiothoracic surgery evaluation.  Following chest tube repeat chest x-ray improved.  On my evaluation patient complains of severe pain at the site of the chest tube insertion, and ongoing shortness of breath.  He denies any chest pressure/pain, no nausea, no vomiting, no fever or chills or any other issues.  He tells me the only medication that he is taking intermittently is albuterol  PRN.  Review of Systems: All systems reviewed, and apart from HPI, all negative  Past Medical History:  Diagnosis Date   Asthma    COPD (chronic obstructive pulmonary disease) (HCC)    GERD (gastroesophageal reflux disease)    Gout    Hypertension    Sleep apnea    Umbilical hernia without obstruction and without gangrene     Past Surgical History:  Procedure Laterality Date   CYST EXCISION     Patient states he was 48 years old, on his back   LUMBAR LAMINECTOMY/DECOMPRESSION MICRODISCECTOMY Left 09/16/2021   Procedure: LEFT L5-S1 MICRODISCECTOMY;  Surgeon: Clois Fret, MD;  Location: [facility] ORS;  Service: Neurosurgery;  Laterality: Left;   UMBILICAL HERNIA REPAIR N/A 07/26/2018   Procedure: OPEN HERNIA REPAIR UMBILICAL ADULT WITH MESH;  Surgeon: Nicholaus Selinda Birmingham, MD;  Location: [facility] ORS;  Service: General;  Laterality: N/A;   XI ROBOTIC ASSISTED INGUINAL HERNIA REPAIR WITH MESH Bilateral 01/31/2022   Procedure: XI ROBOTIC ASSISTED INGUINAL HERNIA REPAIR WITH MESH;  Surgeon: Desiderio Schanz, MD;  Location: [facility] ORS;  Service: General;  Laterality: Bilateral;     reports that he has been smoking cigarettes. He has a 60 pack-year smoking history. He has been exposed to tobacco smoke. His smokeless tobacco use includes chew. He reports that he does not currently use alcohol. He reports current drug use. Drug: Marijuana.  No Known Allergies  Family History  Problem Relation Age of Onset   Lung cancer Father    Asthma Son    Colon cancer Neg Hx    Esophageal cancer Neg Hx    Rectal cancer Neg Hx    Stomach cancer Neg Hx     Prior to Admission medications   Medication Sig Start Date End Date Taking? Authorizing Provider  acetaminophen  (TYLENOL ) 500 MG tablet Take 2 tablets (1,000 mg total) by mouth every 6 (six) hours as needed for mild pain. 01/31/22   Desiderio Schanz, MD  albuterol  (PROVENTIL ) (2.5 MG/3ML) 0.083% nebulizer solution Take 2.5 mg by nebulization every 6 (six) hours as needed for shortness of  breath or wheezing.    [provider]  Albuterol  Sulfate, sensor, (PROAIR  DIGIHALER) 108 (90 Base) MCG/ACT AEPB Inhale 2 puffs into the lungs 4 (four) times daily as needed. 08/10/21   Meade Verdon RAMAN, MD  allopurinol  (ZYLOPRIM ) 300 MG tablet Take 300 mg by mouth daily. 11/04/20   [provider]  ibuprofen  (ADVIL ) 600 MG tablet Take 1 tablet (600 mg total) by mouth every 8 (eight) hours as needed for moderate pain. 01/31/22   Desiderio Schanz, MD  lisinopril  (ZESTRIL ) 20 MG tablet Take 10 mg by mouth daily. 11/21/20   [provider]   Multiple Vitamin (MULTIVITAMIN WITH MINERALS) TABS tablet Take 1 tablet by mouth daily. Patient not taking: Reported on 01/24/2022    [provider]  omeprazole  (PRILOSEC) 20 MG capsule Take 20 mg by mouth as needed.    [provider]  ondansetron  (ZOFRAN -ODT) 8 MG disintegrating tablet Take 1 tablet (8 mg total) by mouth every 8 (eight) hours as needed. 07/18/21   Esterwood, Amy S, PA-C  OVER THE COUNTER MEDICATION     [provider]  oxyCODONE  (OXY IR/ROXICODONE ) 5 MG immediate release tablet Take 1 tablet (5 mg total) by mouth every 4 (four) hours as needed for severe pain. 02/06/22   Desiderio Schanz, MD  oxyCODONE -acetaminophen  (PERCOCET/ROXICET) 5-325 MG tablet Take 1 tablet by mouth every 4 (four) hours as needed for moderate pain.    [provider]  Pancrelipase, Lip-Prot-Amyl, (ZENPEP PO) Take 1 capsule by mouth 2 (two) times daily with a meal. Take with meals and snacks    [provider]    Physical Exam: There were no vitals filed for this visit.    Constitutional: Appears uncomfortable, sitting at the edge of the bed, tachypneic Eyes: PERRL, lids and conjunctivae normal ENMT: Mucous membranes are moist. Posterior pharynx clear of any exudate or lesions.Normal dentition.  Neck: normal, supple Respiratory: Faint rhonchi at the bases, moves air well Cardiovascular: Regular rate and rhythm, no murmurs / rubs / gallops. No extremity edema. 2+ pedal pulses.  Abdomen: no tenderness, no masses palpated. Bowel sounds positive.  Musculoskeletal: no clubbing / cyanosis. Normal muscle tone.  Skin: no rashes, lesions, ulcers. No induration Neurologic: Nonfocal  Labs on Admission: I have personally reviewed following labs and imaging studies  CBC: No results for input(s): WBC, NEUTROABS, HGB, HCT, MCV, PLT in the last 168 hours. Basic Metabolic Panel: No results for input(s): NA, K, CL, CO2, GLUCOSE, BUN, CREATININE,  CALCIUM, MG, PHOS in the last 168 hours. Liver Function Tests: No results for input(s): AST, ALT, ALKPHOS, BILITOT, PROT, ALBUMIN in the last 168 hours. Coagulation Profile: No results for input(s): INR, PROTIME in the last 168 hours. BNP (last 3 results) No results for input(s): PROBNP in the last 8760 hours. CBG: No results for input(s): GLUCAP in the last 168 hours. Thyroid Function Tests: No results for input(s): TSH, T4TOTAL, FREET4, T3FREE, THYROIDAB in the last 72 hours. Urine analysis:    Component Value Date/Time   COLORURINE YELLOW (A) 01/04/2022 1207   APPEARANCEUR HAZY (A) 01/04/2022 1207   APPEARANCEUR Clear 01/22/2019 1023   LABSPEC 1.016 01/04/2022 1207   PHURINE 5.0 01/04/2022 1207   GLUCOSEU NEGATIVE 01/04/2022 1207   HGBUR NEGATIVE 01/04/2022 1207   BILIRUBINUR NEGATIVE 01/04/2022 1207   BILIRUBINUR Negative 01/22/2019 1023   KETONESUR NEGATIVE 01/04/2022 1207   PROTEINUR NEGATIVE 01/04/2022 1207   NITRITE NEGATIVE 01/04/2022 1207   LEUKOCYTESUR NEGATIVE 01/04/2022 1207  Radiological Exams on Admission: No results found.  EKG: Independently reviewed.  Sinus rhythm on the monitor  Assessment/Plan Right-sided pneumothorax -cardiothoracic surgery consulted, appreciate input.  Will obtain a chest x-ray on admission.  Chest tube was placed in the ED prior to transfer. -Pain control, further management per surgery  Active problems Tobacco use-continue nicotine patch.  Albuterol  PRN  History of hypertension-appears to be on lisinopril  however he tells me he only uses albuterol  and does not take any other meds.  Pharmacy rec pending  DVT prophylaxis: Lovenox  Code Status: Full code  Family Communication: no family at bedside  Disposition Plan: home when ready  Bed Type: medical tele Consults called: cardiothoracic surgery   Obs/Inp: Inpatient  At the time of admission, it appears that the appropriate admission status  for this patient is INPATIENT as it is expected that patient will require hospital care > 2 midnights. This is judged to be reasonable and necessary in order to provide the required intensity of service to ensure the patient's safety given: presenting symptoms, initial radiographic and laboratory data and in the context of their chronic comorbidities. Together, these circumstances are felt to place patient at high at high risk for further clinical deterioration threatening life, limb, or organ.  Nilda Fendt, MD, PhD [facility]  Contact via [email or web address]  12/19/2023, 4:40 PM

## 2023-12-19 NOTE — Plan of Care (Signed)

## 2023-12-19 NOTE — Consult Note (Addendum)
 [address]       [place] [address]             [phone]        EUGINE BUBB [facility] Medical Record #[id number] Date of Birth: 1976/04/08  Referring: Larina Brigid Haber* Primary Care: Darra Hamilton, PA-C Primary Cardiologist:None  Chief Complaint:   Shortness of breath Reason for consultation: Right spontaneous pneumothorax   History of Present Illness:     This is a 48 year old male with a past medical history of tobacco abuse, COPD, OSA (never followed up so not on CPAP), GERD, and hypertension who presented to [facility] with complaints of severe shortness of breath earlier today. Oxygen  saturation was 89% on 10L. He was given IV Solumedrol and a Duo Neb treatment. Chest x ray showed a large right pneumothorax with tension physiology. A 8.5 French pigtail catheter was placed in the right chest. Repeat chest x ray showed a smaller right pneumothorax. Patient was accepted in transfer to [facility] by Hospitalist and cardiothoracic surgery was consulted for management of right spontaneous pneumothorax and chest tube. At the time of my exam, chest tube is to suction, no air leak, oxygenation 91%. Patient also appears tachypenic. I thoroughly examined chest tube connections and chest tube is NOT to suction (toggle is down on Pleura Vac). I hooked up chest tube correctly and he appears to be breathing more comfortably.  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 He has been using tobacco for many years. His father died of lung cancer and this incident has scared him enough to try and quit.   Current Activity/ Functional Status: Patient is independent with mobility/ambulation, transfers, ADL's, IADL's.   Zubrod Score: At the time of surgery this patient's most appropriate activity status/level should be described as: []     0    Normal activity, no symptoms [x]     1    Restricted in physical strenuous activity but ambulatory, able to do out light  work []     2    Ambulatory and capable of self care, unable to do work activities, up and about more than 50% of the time                            []     3    Only limited self care, in bed greater than 50% of waking hours []     4    Completely disabled, no self care, confined to bed or chair []     5    Moribund  Past Medical History:  Diagnosis Date   Asthma    COPD (chronic obstructive pulmonary disease) (HCC)    GERD (gastroesophageal reflux disease)    Gout    Hypertension    Sleep apnea    Umbilical hernia without obstruction and without gangrene     Past Surgical History:  Procedure Laterality Date   CYST EXCISION     Patient states he was 48 years old, on his back   LUMBAR LAMINECTOMY/DECOMPRESSION MICRODISCECTOMY Left 09/16/2021   Procedure: LEFT L5-S1 MICRODISCECTOMY;  Surgeon: Clois Fret, MD;  Location: [facility] ORS;  Service: Neurosurgery;  Laterality: Left;   UMBILICAL HERNIA REPAIR N/A 07/26/2018   Procedure: OPEN HERNIA REPAIR UMBILICAL ADULT WITH MESH;  Surgeon: Nicholaus Selinda Birmingham, MD;  Location: [facility] ORS;  Service: General;  Laterality: N/A;   XI ROBOTIC ASSISTED INGUINAL HERNIA REPAIR WITH  MESH Bilateral 01/31/2022   Procedure: XI ROBOTIC ASSISTED INGUINAL HERNIA REPAIR WITH MESH;  Surgeon: Desiderio Schanz, MD;  Location: [facility] ORS;  Service: General;  Laterality: Bilateral;    Social History   Tobacco Use  Smoking Status Every Day   Current packs/day: 2.00   Average packs/day: 2.0 packs/day for 30.0 years (60.0 ttl pk-yrs)   Types: Cigarettes   Passive exposure: Current  Smokeless Tobacco Current   Types: Chew  Tobacco Comments   Smokes a ppd.  Wants to quit.  08/10/2021.    Social History   Substance and Sexual Activity  Alcohol Use Not Currently   Comment: drank age [age], quit   Allergies: No Known Allergies   Medications Prior to Admission  Medication Sig Dispense Refill Last Dose/Taking   acetaminophen  (TYLENOL ) 500 MG tablet Take 2 tablets  (1,000 mg total) by mouth every 6 (six) hours as needed for mild pain.      albuterol  (PROVENTIL ) (2.5 MG/3ML) 0.083% nebulizer solution Take 2.5 mg by nebulization every 6 (six) hours as needed for shortness of breath or wheezing.      Albuterol  Sulfate, sensor, (PROAIR  DIGIHALER) 108 (90 Base) MCG/ACT AEPB Inhale 2 puffs into the lungs 4 (four) times daily as needed. 1 each 5    allopurinol  (ZYLOPRIM ) 300 MG tablet Take 300 mg by mouth daily.      ibuprofen  (ADVIL ) 600 MG tablet Take 1 tablet (600 mg total) by mouth every 8 (eight) hours as needed for moderate pain. 60 tablet 1    lisinopril  (ZESTRIL ) 20 MG tablet Take 10 mg by mouth daily.      Multiple Vitamin (MULTIVITAMIN WITH MINERALS) TABS tablet Take 1 tablet by mouth daily. (Patient not taking: Reported on 01/24/2022)      omeprazole  (PRILOSEC) 20 MG capsule Take 20 mg by mouth as needed.      ondansetron  (ZOFRAN -ODT) 8 MG disintegrating tablet Take 1 tablet (8 mg total) by mouth every 8 (eight) hours as needed. 50 tablet 4    OVER THE COUNTER MEDICATION       oxyCODONE  (OXY IR/ROXICODONE ) 5 MG immediate release tablet Take 1 tablet (5 mg total) by mouth every 4 (four) hours as needed for severe pain. 30 tablet 0    oxyCODONE -acetaminophen  (PERCOCET/ROXICET) 5-325 MG tablet Take 1 tablet by mouth every 4 (four) hours as needed for moderate pain.      Pancrelipase, Lip-Prot-Amyl, (ZENPEP PO) Take 1 capsule by mouth 2 (two) times daily with a meal. Take with meals and snacks       Family History  Problem Relation Age of Onset   Lung cancer Father    Asthma Son    Colon cancer Neg Hx    Esophageal cancer Neg Hx    Rectal cancer Neg Hx    Stomach cancer Neg Hx    Review of Systems:     General Review of Systems: [Y] = yes [ N ]=no Constitional: nausea [  N];  fever [ N ];  Resp: cough [ Y ];  wheezing[ Y ];  hemoptysis[ N ]; shortness of breath[  Y]; Physical Exam: General appearance: cooperative and no distress Neck: supple,  symmetrical, trachea midline Resp: Bilateral wheezing Cardio: RRR GI: Soft, non tender, bowel sounds present Extremities: No LE edema Neurologic: Grossly normal  Diagnostic Studies & Laboratory data:     Recent Radiology Findings:   No results found.   I have independently reviewed the above radiologic studies and discussed with the patient  Recent Lab Findings: Lab Results  Component Value Date   WBC 10.8 (H) 01/04/2022   HGB 14.8 01/04/2022   HCT 42.9 01/04/2022   PLT 306 01/04/2022   GLUCOSE 117 (H) 01/04/2022   CHOL 170 01/22/2019   TRIG 211 (H) 01/22/2019   HDL 35 (L) 01/22/2019   LDLCALC 93 01/22/2019   ALT 14 01/04/2022   AST 14 (L) 01/04/2022   NA 139 01/04/2022   K 4.2 01/04/2022   CL 104 01/04/2022   CREATININE 0.80 01/04/2022   BUN 16 01/04/2022   CO2 27 01/04/2022   TSH 1.40 07/18/2021   HGBA1C 5.4 06/06/2018   Assessment / Plan:   Right spontaneous pneumothorax-8.5 French chest tube placed at [facility]. Chest tube to remain to suction for now. Check CXR in am. Etiology of above likely secondary to smoking (blebs). Chest tube dressing with light yellow drainage;will change in am 2. History of hypertension-on Lisinopril  10 mg daily prior to admission 3. History of tobacco abuse-encouraged cessation. His father died of lung cancer and he wants to try to quit  I  spent 25 minutes counseling the patient face to face.   Donielle Zimmerman PA-C 12/19/2023 4:46 PM    Chart reviewed, patient examined, agree with above. 48 year old smoker with first episode of spontaneous right ptx who had a chest tube placed at [facility] and transferred here for further management. CXR after CT placement there reportedly showed a smaller ptx. He has no air leak from chest tube on 20 cm suction and good breath sounds bilaterally. Will keep to suction tonight and check CXR in am.

## 2023-12-19 NOTE — ED Triage Notes (Signed)
 Shortness of breath off and on for the past couple weeks, worse this AM. O2 sats 83% in triage. Dr. Cristopher and RT at bedside.

## 2023-12-20 ENCOUNTER — Inpatient Hospital Stay (HOSPITAL_COMMUNITY)

## 2023-12-20 DIAGNOSIS — G4733 Obstructive sleep apnea (adult) (pediatric): Secondary | ICD-10-CM | POA: Diagnosis not present

## 2023-12-20 DIAGNOSIS — J439 Emphysema, unspecified: Secondary | ICD-10-CM | POA: Diagnosis not present

## 2023-12-20 DIAGNOSIS — J939 Pneumothorax, unspecified: Secondary | ICD-10-CM | POA: Diagnosis not present

## 2023-12-20 DIAGNOSIS — J9311 Primary spontaneous pneumothorax: Secondary | ICD-10-CM | POA: Diagnosis not present

## 2023-12-20 DIAGNOSIS — Z720 Tobacco use: Secondary | ICD-10-CM

## 2023-12-20 DIAGNOSIS — Z4682 Encounter for fitting and adjustment of non-vascular catheter: Secondary | ICD-10-CM | POA: Diagnosis not present

## 2023-12-20 DIAGNOSIS — J449 Chronic obstructive pulmonary disease, unspecified: Secondary | ICD-10-CM | POA: Diagnosis not present

## 2023-12-20 DIAGNOSIS — J9383 Other pneumothorax: Secondary | ICD-10-CM | POA: Diagnosis not present

## 2023-12-20 DIAGNOSIS — K219 Gastro-esophageal reflux disease without esophagitis: Secondary | ICD-10-CM | POA: Diagnosis not present

## 2023-12-20 DIAGNOSIS — I10 Essential (primary) hypertension: Secondary | ICD-10-CM | POA: Diagnosis not present

## 2023-12-20 LAB — COMPREHENSIVE METABOLIC PANEL WITH GFR
ALT: 17 U/L (ref 0–44)
AST: 18 U/L (ref 15–41)
Albumin: 3.5 g/dL (ref 3.5–5.0)
Alkaline Phosphatase: 76 U/L (ref 38–126)
Anion gap: 9 (ref 5–15)
BUN: 15 mg/dL (ref 6–20)
CO2: 26 mmol/L (ref 22–32)
Calcium: 9 mg/dL (ref 8.9–10.3)
Chloride: 102 mmol/L (ref 98–111)
Creatinine, Ser: 0.69 mg/dL (ref 0.61–1.24)
GFR, Estimated: >60 mL/min (ref >60)
Glucose, Bld: 100 mg/dL — ABNORMAL HIGH (ref 70–99)
Potassium: 4.1 mmol/L (ref 3.5–5.1)
Sodium: 137 mmol/L (ref 135–145)
Total Bilirubin: 0.3 mg/dL (ref 0.0–1.2)
Total Protein: 6.4 g/dL — ABNORMAL LOW (ref 6.5–8.1)

## 2023-12-20 LAB — CBC
HCT: 40.3 % (ref 39.0–52.0)
Hemoglobin: 13.7 g/dL (ref 13.0–17.0)
MCH: 31.1 pg (ref 26.0–34.0)
MCHC: 34 g/dL (ref 30.0–36.0)
MCV: 91.6 fL (ref 80.0–100.0)
Platelets: 226 10*3/uL (ref 150–400)
RBC: 4.4 MIL/uL (ref 4.22–5.81)
RDW: 13.1 % (ref 11.5–15.5)
WBC: 12.1 10*3/uL — ABNORMAL HIGH (ref 4.0–10.5)
nRBC: 0 % (ref 0.0–0.2)

## 2023-12-20 LAB — HIV ANTIBODY (ROUTINE TESTING W REFLEX): HIV Screen 4th Generation wRfx: NONREACTIVE

## 2023-12-20 MED ORDER — KETOROLAC TROMETHAMINE 30 MG/ML IJ SOLN: 30.0000 mg | Freq: Three times a day (TID) | INTRAMUSCULAR | Status: DC | PRN

## 2023-12-20 MED ORDER — MELATONIN 5 MG PO TABS
5.0000 mg | ORAL_TABLET | Freq: Every evening | ORAL | Status: DC | PRN
  Administered 2023-12-20 (×2): 5 mg via ORAL
  Filled 2023-12-20 (×2): qty 1

## 2023-12-20 MED ORDER — MORPHINE SULFATE (PF) 2 MG/ML IV SOLN
2.0000 mg | INTRAVENOUS | Status: DC | PRN
  Administered 2023-12-20 – 2023-12-22 (×8): 2 mg via INTRAVENOUS
  Filled 2023-12-20 (×8): qty 1

## 2023-12-20 MED ORDER — KETOROLAC TROMETHAMINE 30 MG/ML IJ SOLN
30.0000 mg | Freq: Four times a day (QID) | INTRAMUSCULAR | Status: AC | PRN
  Administered 2023-12-20 – 2023-12-21 (×5): 30 mg via INTRAVENOUS
  Filled 2023-12-20 (×6): qty 1

## 2023-12-20 MED ORDER — PANTOPRAZOLE SODIUM 40 MG PO TBEC
40.0000 mg | DELAYED_RELEASE_TABLET | Freq: Every day | ORAL | Status: DC
  Administered 2023-12-20 – 2023-12-22 (×3): 40 mg via ORAL
  Filled 2023-12-20 (×3): qty 1

## 2023-12-20 MED ORDER — DIPHENHYDRAMINE HCL 25 MG PO CAPS
25.0000 mg | ORAL_CAPSULE | Freq: Once | ORAL | Status: AC | PRN
Start: 2023-12-20 — End: 2023-12-20
  Administered 2023-12-20: 25 mg via ORAL
  Filled 2023-12-20: qty 1

## 2023-12-20 MED ORDER — HYDRALAZINE HCL 20 MG/ML IJ SOLN
10.0000 mg | Freq: Four times a day (QID) | INTRAMUSCULAR | Status: DC | PRN
  Administered 2023-12-20: 10 mg via INTRAVENOUS
  Filled 2023-12-20 (×2): qty 1

## 2023-12-20 MED ORDER — TRAMADOL HCL 50 MG PO TABS
50.0000 mg | ORAL_TABLET | Freq: Four times a day (QID) | ORAL | Status: DC | PRN
  Administered 2023-12-20 – 2023-12-22 (×5): 50 mg via ORAL
  Filled 2023-12-20 (×7): qty 1

## 2023-12-20 NOTE — Progress Notes (Signed)
 PROGRESS NOTE                                                                                                                                                                                                             Patient Demographics:    Albert Hill, is a 48 y.o. male, DOB - 1976-02-06, FMW:[id number]  Outpatient Primary MD for the patient is Albert Hamilton, PA-C    LOS - 1  Admit date - 12/19/2023    No chief complaint on file.      Brief Narrative (HPI from H&P)   48 y.o. male with medical history significant of tobacco use, HTN, comes into the hospital with severe shortness of breath.  He has been feeling intermittently short of breath for the past 2 weeks but was able to go to work, be active without major issues, and shortness of breath has gotten maybe slightly worse in the last couple of days.  When he woke up this morning he felt really short of breath and decided to go to the emergency room, and went to [facility] ER.  He was found to be hypoxic requiring 10 L of oxygen , and a chest x-ray showed right-sided pneumothorax.  He had a chest tube placed in the ER and he was transferred to [facility] for cardiothoracic surgery evaluation.    Subjective:    Albert Hill today has, No headache, mild pleuritic right-sided chest pain, No abdominal pain - No Nausea, No new weakness tingling or numbness, no SOB   Assessment  & Plan :    Right-sided pneumothorax - he is s/p chest tube placement on the right side at an outlying ER, cardiothoracic surgery following, pneumothorax seems to have improved.  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  Strictly counseled to quit smoking.  Monitor.   Tobacco use-continue nicotine patch.  Albuterol  PRN, counseled to quit smoking   History of hypertension-stable for now as needed hydralazine.  GERD.  Start on PPI.       Condition - Fair  Family Communication  : None present  Code Status :  Full  Consults  :  CTS  PUD Prophylaxis :  PPI   Procedures  :     Right-sided chest tube placement by the ED physician      Disposition  Plan  :    Status is: Inpatient   DVT Prophylaxis  :    enoxaparin (LOVENOX) injection 40 mg Start: 12/19/23 1745    Lab Results  Component Value Date   PLT 226 12/20/2023    Diet :  Diet Order             Diet regular Room service appropriate? Yes; Fluid consistency: Thin  Diet effective now                    Inpatient Medications  Scheduled Meds:  enoxaparin (LOVENOX) injection  40 mg Subcutaneous Q24H   nicotine  21 mg Transdermal Daily   Continuous Infusions: PRN Meds:.acetaminophen  **OR** acetaminophen , albuterol , ketorolac , melatonin, ondansetron  **OR** ondansetron  (ZOFRAN ) IV, traMADol   Antibiotics  :    Anti-infectives (From admission, onward)    None         Objective:   Vitals:   12/20/23 0000 12/20/23 0200 12/20/23 0400 12/20/23 0825  BP: 124/84  (!) 123/95   Pulse: 72 74 65 79  Resp: 11 14 13    Temp:   98 F (36.7 C)   TempSrc:   Axillary   SpO2: 94% 96% 92%   Height:        Wt Readings from Last 3 Encounters:  01/24/22 67.6 kg  01/18/22 67.4 kg  01/04/22 71.8 kg     Intake/Output Summary (Last 24 hours) at 12/20/2023 0926 Last data filed at 12/20/2023 0806 Gross per 24 hour  Intake 120 ml  Output 1200 ml  Net -1080 ml     Physical Exam  Awake Alert, No new F.N deficits, Normal affect [name],PERRAL Supple Neck, No JVD,   Symmetrical Chest wall movement, right-sided chest tube in place RRR,No Gallops,Rubs or new Murmurs,  +ve B.Sounds, Abd Soft, No tenderness,   No Cyanosis, Clubbing or edema      Data Review:    Recent Labs  Lab 12/19/23 1948 12/20/23 0550  WBC 12.8* 12.1*  HGB 14.8 13.7  HCT 42.7 40.3  PLT 277 226  MCV 90.3 91.6  MCH 31.3 31.1  MCHC 34.7 34.0  RDW 13.0 13.1   LYMPHSABS 1.4  --   MONOABS 0.4  --   EOSABS 0.0  --   BASOSABS 0.0  --     Recent Labs  Lab 12/20/23 0550  NA 137  K 4.1  CL 102  CO2 26  ANIONGAP 9  GLUCOSE 100*  BUN 15  CREATININE 0.69  AST 18  ALT 17  ALKPHOS 76  BILITOT 0.3  ALBUMIN 3.5  CALCIUM 9.0      Recent Labs  Lab 12/20/23 0550  CALCIUM 9.0    --------------------------------------------------------------------------------------------------------------- Lab Results  Component Value Date   CHOL 170 01/22/2019   HDL 35 (L) 01/22/2019   LDLCALC 93 01/22/2019   TRIG 211 (H) 01/22/2019   CHOLHDL 4.9 01/22/2019    Lab Results  Component Value Date   HGBA1C 5.4 06/06/2018   No results for input(s): TSH, T4TOTAL, FREET4, T3FREE, THYROIDAB in the last 72 hours. No results for input(s): VITAMINB12, FOLATE, FERRITIN, TIBC, IRON, RETICCTPCT in the last 72 hours. ------------------------------------------------------------------------------------------------------------------ Cardiac Enzymes No results for input(s): CKMB, TROPONINI, MYOGLOBIN in the last 168 hours.  Invalid input(s): CK  Micro Results No results found for this or any previous visit (from the past 240 hours).  Radiology Report DG Chest 1 View Result Date: 12/20/2023 CLINICAL DATA:  Pneumothorax. EXAM: CHEST  1 VIEW COMPARISON:  02/24/2020 and  older studies. FINDINGS: Pigtail chest tube lies along the right lateral mid thoracic pleural space. No pneumothorax. There are linear opacities at the apices consistent with scarring, as well as lucency consistent with emphysema. Lungs are hyperexpanded, but otherwise clear. No pleural effusion. Cardiac silhouette is normal in size. No mediastinal or hilar masses. Skeletal structures are grossly intact. IMPRESSION: 1. Right-sided chest tube as detailed. No pneumothorax. No acute cardiopulmonary disease. 2. Emphysema. Electronically Signed   By: Alm Parkins M.D.   On:  12/20/2023 07:25     Signature  -   Lavada Stank M.D on 12/20/2023 at 9:26 AM   -  To page go to [email or web address]

## 2023-12-20 NOTE — Plan of Care (Signed)

## 2023-12-20 NOTE — Progress Notes (Addendum)
      [address] [place] [address]             [phone]       Subjective: Mr. Iovino is a pleasant 48 year-old male with a medical history of tobacco abuse, COPD, OSA w/o CPAP, GERD, and HTN. He is laying in the bed this morning in mild distress. He is mildly short of breath with no chest pain. However, he does have a productive cough. He has no other complaints at this time.   Objective: Vital signs in last 24 hours: Temp:  [97.5 F (36.4 C)-98 F (36.7 C)] 98 F (36.7 C) (07/03 0400) Pulse Rate:  [65-92] 65 (07/03 0400) Cardiac Rhythm: Normal sinus rhythm (07/02 1905) Resp:  [11-22] 13 (07/03 0400) BP: (122-163)/(82-107) 123/95 (07/03 0400) SpO2:  [92 %-96 %] 92 % (07/03 0400) FiO2 (%):  [0.3 %] 0.3 % (07/02 1652)  Hemodynamic parameters for last 24 hours:   Physical Exam:  General: Well appearing, mild distress.  Cardiac: Regular rate and rhythm. Sinus rhythm at 65. No murmurs, rubs, or gallops.  Pulmonary: CXR is stable, no pneumothorax. Lung sounds are diminished with bouts of wheezing, globally. Respiratory effort is shallow and fast. Moderate use of diaphragm and intercoastal muscles. Cough is moderate, no air leak noted on pleur-vac.  Chest: Chest tube site has a serous soiled dressing. This was changed.   Lab Results: Recent Labs    12/19/23 1948 12/20/23 0550  WBC 12.8* 12.1*  HGB 14.8 13.7  HCT 42.7 40.3  PLT 277 226   BMET:  Recent Labs    12/20/23 0550  NA 137  K 4.1  CL 102  CO2 26  GLUCOSE 100*  BUN 15  CREATININE 0.69  CALCIUM 9.0    PT/INR: No results for input(s): LABPROT, INR in the last 72 hours. ABG No results found for: PHART, HCO3, TCO2, ACIDBASEDEF, O2SAT CBG (last 3)  No results for input(s): GLUCAP in the last 72 hours.  Assessment/Plan: S/P   Assessment and Plan:  Counseled on pulmonary hygiene... PFTs.... incentive spirometer.  CXR remains stable. Repeat in the morning, if  stable clapping trial.  Productive cough..... Mucinex . Wheezing/Diminished Lung Sounds... DuoNeb Treatments.... oxygen  therapy as needed.  Pain mangement... scheduled Toradol . Renal function... stable at 0.69.    LOS: 1 day    Lytle Pizza 12/20/2023  Agree with above documentation, changes per my note...  Patient with initial spontaneous pneumothorax.  Having some pain this morning.. States he is mostly sore.  He does get relief with pain medication.  Gen: NAD Heart: RRR Lungs: diminished bilaterally Wound: CT on suction w/o air leak.. some serous drainage on dressing  A/P:  Spontaneous pneumothorax- 1st occurrence, CT on suction w/o air leak.. CXR w/o pneumothorax.. leave on suction today Pain control- will add Toradol  q 6 prn.. Pulm- add IS, patient educated on importance of smoking cessation  Plan: CT to suction today, no air leak.. will transition to water seal tomorrow.. can hopefully manage conservatively... Care per medicine.  Erin Barrett, PA-C 8:12 AM 12/20/23   Chart reviewed, patient examined, agree with above.  CXR ok without ptx. No air leak. Plan to leave to suction today and then transition to water seal tomorrow.

## 2023-12-20 NOTE — Plan of Care (Signed)
  Problem: Education: Goal: Knowledge of General Education information will improve Description: Including pain rating scale, medication(s)/side effects and non-pharmacologic comfort measures Outcome: Progressing   Problem: Clinical Measurements: Goal: Will remain free from infection Outcome: Progressing Goal: Diagnostic test results will improve Outcome: Progressing   Problem: Activity: Goal: Risk for activity intolerance will decrease Outcome: Progressing   Problem: Nutrition: Goal: Adequate nutrition will be maintained Outcome: Progressing   Problem: Coping: Goal: Level of anxiety will decrease Outcome: Progressing   Problem: Safety: Goal: Ability to remain free from injury will improve Outcome: Progressing

## 2023-12-21 ENCOUNTER — Inpatient Hospital Stay (HOSPITAL_COMMUNITY)

## 2023-12-21 DIAGNOSIS — J9311 Primary spontaneous pneumothorax: Secondary | ICD-10-CM | POA: Diagnosis not present

## 2023-12-21 DIAGNOSIS — J939 Pneumothorax, unspecified: Secondary | ICD-10-CM | POA: Diagnosis not present

## 2023-12-21 DIAGNOSIS — J9383 Other pneumothorax: Secondary | ICD-10-CM | POA: Diagnosis not present

## 2023-12-21 LAB — MAGNESIUM: Magnesium: 2 mg/dL (ref 1.7–2.4)

## 2023-12-21 MED ORDER — ZOLPIDEM TARTRATE 5 MG PO TABS
5.0000 mg | ORAL_TABLET | Freq: Every evening | ORAL | Status: DC | PRN
  Administered 2023-12-21: 5 mg via ORAL
  Filled 2023-12-21: qty 1

## 2023-12-21 NOTE — Plan of Care (Signed)
  Problem: Clinical Measurements: Goal: Diagnostic test results will improve Outcome: Progressing Goal: Respiratory complications will improve Outcome: Progressing   Problem: Nutrition: Goal: Adequate nutrition will be maintained Outcome: Progressing   Problem: Pain Managment: Goal: General experience of comfort will improve and/or be controlled Outcome: Progressing

## 2023-12-21 NOTE — Plan of Care (Signed)
  Problem: Education: Goal: Knowledge of General Education information will improve Description: Including pain rating scale, medication(s)/side effects and non-pharmacologic comfort measures Outcome: Progressing   Problem: Clinical Measurements: Goal: Will remain free from infection Outcome: Progressing Goal: Diagnostic test results will improve Outcome: Progressing   Problem: Activity: Goal: Risk for activity intolerance will decrease Outcome: Progressing   Problem: Coping: Goal: Level of anxiety will decrease Outcome: Progressing   Problem: Pain Managment: Goal: General experience of comfort will improve and/or be controlled Outcome: Progressing

## 2023-12-21 NOTE — Progress Notes (Addendum)
  Subjective: Didn't sleep, breatging is comfortable at rest  Objective: Vital signs in last 24 hours: Temp:  [97.7 F (36.5 C)-97.9 F (36.6 C)] 97.7 F (36.5 C) (07/03 2319) Pulse Rate:  [69-87] 77 (07/03 2319) Cardiac Rhythm: Normal sinus rhythm (07/03 1901) Resp:  [16-24] 16 (07/03 2319) BP: (132-176)/(98-120) 132/98 (07/03 2319) SpO2:  [95 %-96 %] 95 % (07/03 2319)  Hemodynamic parameters for last 24 hours:    Intake/Output from previous day: 07/03 0701 - 07/04 0700 In: 120 [P.O.:120] Out: 1210 [Urine:1200; Chest Tube:10] Intake/Output this shift: Total I/O In: -  Out: 750 [Urine:750]  General appearance: alert, cooperative, and no distress Heart: regular rate and rhythm Lungs: + wheeze throughout  Lab Results: Recent Labs    12/19/23 1948 12/20/23 0550  WBC 12.8* 12.1*  HGB 14.8 13.7  HCT 42.7 40.3  PLT 277 226   BMET:  Recent Labs    12/20/23 0550  NA 137  K 4.1  CL 102  CO2 26  GLUCOSE 100*  BUN 15  CREATININE 0.69  CALCIUM 9.0    PT/INR: No results for input(s): LABPROT, INR in the last 72 hours. ABG No results found for: PHART, HCO3, TCO2, ACIDBASEDEF, O2SAT CBG (last 3)  No results for input(s): GLUCAP in the last 72 hours.  Meds Scheduled Meds:  enoxaparin  (LOVENOX ) injection  40 mg Subcutaneous Q24H   nicotine   21 mg Transdermal Daily   pantoprazole   40 mg Oral Daily   Continuous Infusions: PRN Meds:.acetaminophen  **OR** acetaminophen , albuterol , hydrALAZINE , ketorolac , morphine  injection, ondansetron  **OR** ondansetron  (ZOFRAN ) IV, traMADol   Xrays DG Chest 1 View Result Date: 12/20/2023 CLINICAL DATA:  Pneumothorax. EXAM: CHEST  1 VIEW COMPARISON:  02/24/2020 and older studies. FINDINGS: Pigtail chest tube lies along the right lateral mid thoracic pleural space. No pneumothorax. There are linear opacities at the apices consistent with scarring, as well as lucency consistent with emphysema. Lungs are hyperexpanded,  but otherwise clear. No pleural effusion. Cardiac silhouette is normal in size. No mediastinal or hilar masses. Skeletal structures are grossly intact. IMPRESSION: 1. Right-sided chest tube as detailed. No pneumothorax. No acute cardiopulmonary disease. 2. Emphysema. Electronically Signed   By: Alm Parkins M.D.   On: 12/20/2023 07:25    Assessment/Plan:  1 afeb, s BP 130's-170's, , sinus rhythm 2 sats ok on 2 liters 3 CT 10 ml, no air leak- will place to water seal 4 CXR- no pntx 5 Mg++ 2.0, no other new labs 6 cont nebs 7 add ambien  prn for sleep 9 CXR in am 10 discussed smoking cessation- he is determined to not smoke anymore    LOS: 2 days    Lemond FORBES Cera PA-C Pager [phone] 12/21/2023   Chart reviewed, patient examined, agree with above.  CXR shows no ptx.  No air leak from chest tube. Placed to water seal and if no changes should be able to remove tube tomorrow and potentially send home if CXR after CT removal ok.

## 2023-12-21 NOTE — Progress Notes (Addendum)
 Patient refused for ambulation.No any reason mentioned.Told will ambulate at night.Education provided as needed.

## 2023-12-21 NOTE — Progress Notes (Signed)
 PROGRESS NOTE                                                                                                                                                                                                             Patient Demographics:    Albert Hill, is a 48 y.o. male, DOB - July 29, 1975, FMW:[id number]  Outpatient Primary MD for the patient is Patient, No Pcp Per    LOS - 2  Admit date - 12/19/2023    No chief complaint on file.      Brief Narrative (HPI from H&P)   48 y.o. male with medical history significant of tobacco use, HTN, comes into the hospital with severe shortness of breath.  He has been feeling intermittently short of breath for the past 2 weeks but was able to go to work, be active without major issues, and shortness of breath has gotten maybe slightly worse in the last couple of days.  When he woke up this morning he felt really short of breath and decided to go to the emergency room, and went to [facility] ER.  He was found to be hypoxic requiring 10 L of oxygen , and a chest x-ray showed right-sided pneumothorax.  He had a chest tube placed in the ER and he was transferred to [facility] for cardiothoracic surgery evaluation.    Subjective:   Patient in bed, appears comfortable, denies any headache, no fever, no chest pain or pressure, no shortness of breath , no abdominal pain. No focal weakness.   Assessment  & Plan :    Right-sided pneumothorax - he is s/p chest tube placement on the right side at an outlying ER, cardiothoracic surgery following, pneumothorax seems to have improved.  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  Strictly counseled to quit smoking.  Monitor.   Tobacco use-continue nicotine  patch.  Albuterol  PRN, counseled to quit smoking   History of hypertension-stable for now as needed hydralazine .  GERD.  Start on PPI.       Condition - Fair  Family Communication  : None present  Code Status :  Full  Consults  :  CTS  PUD Prophylaxis :  PPI   Procedures  :     Right-sided chest tube placement by the ED physician  Disposition Plan  :    Status is: Inpatient   DVT Prophylaxis  :    enoxaparin  (LOVENOX ) injection 40 mg Start: 12/19/23 1745    Lab Results  Component Value Date   PLT 226 12/20/2023    Diet :  Diet Order             Diet regular Room service appropriate? Yes; Fluid consistency: Thin  Diet effective now                    Inpatient Medications  Scheduled Meds:  enoxaparin  (LOVENOX ) injection  40 mg Subcutaneous Q24H   nicotine   21 mg Transdermal Daily   pantoprazole   40 mg Oral Daily   Continuous Infusions: PRN Meds:.acetaminophen  **OR** acetaminophen , albuterol , hydrALAZINE , ketorolac , morphine  injection, ondansetron  **OR** ondansetron  (ZOFRAN ) IV, traMADol , zolpidem   Antibiotics  :    Anti-infectives (From admission, onward)    None         Objective:   Vitals:   12/20/23 1720 12/20/23 1928 12/20/23 2319 12/21/23 0838  BP: (!) 176/120 (!) 157/102 (!) 132/98 (!) 129/97  Pulse: 69  77 72  Resp: (!) 24  16   Temp: 97.9 F (36.6 C) 97.9 F (36.6 C) 97.7 F (36.5 C) (!) 97.2 F (36.2 C)  TempSrc: Oral Oral Oral Oral  SpO2: 96%  95% 97%  Height:        Wt Readings from Last 3 Encounters:  01/24/22 67.6 kg  01/18/22 67.4 kg  01/04/22 71.8 kg     Intake/Output Summary (Last 24 hours) at 12/21/2023 0934 Last data filed at 12/21/2023 0756 Gross per 24 hour  Intake --  Output 760 ml  Net -760 ml     Physical Exam  Awake Alert, No new F.N deficits, Normal affect [place].AT,PERRAL Supple Neck, No JVD,   Symmetrical Chest wall movement, right-sided chest tube in place RRR,No Gallops,Rubs or new Murmurs,  +ve B.Sounds, Abd Soft, No tenderness,   No Cyanosis, Clubbing or edema      Data Review:    Recent Labs  Lab 12/19/23 1948  12/20/23 0550  WBC 12.8* 12.1*  HGB 14.8 13.7  HCT 42.7 40.3  PLT 277 226  MCV 90.3 91.6  MCH 31.3 31.1  MCHC 34.7 34.0  RDW 13.0 13.1  LYMPHSABS 1.4  --   MONOABS 0.4  --   EOSABS 0.0  --   BASOSABS 0.0  --     Recent Labs  Lab 12/20/23 0550 12/21/23 0504  NA 137  --   K 4.1  --   CL 102  --   CO2 26  --   ANIONGAP 9  --   GLUCOSE 100*  --   BUN 15  --   CREATININE 0.69  --   AST 18  --   ALT 17  --   ALKPHOS 76  --   BILITOT 0.3  --   ALBUMIN 3.5  --   MG  --  2.0  CALCIUM 9.0  --       Recent Labs  Lab 12/20/23 0550 12/21/23 0504  MG  --  2.0  CALCIUM 9.0  --     --------------------------------------------------------------------------------------------------------------- Lab Results  Component Value Date   CHOL 170 01/22/2019   HDL 35 (L) 01/22/2019   LDLCALC 93 01/22/2019   TRIG 211 (H) 01/22/2019   CHOLHDL 4.9 01/22/2019    Lab Results  Component Value Date   HGBA1C 5.4 06/06/2018   No results  for input(s): TSH, T4TOTAL, FREET4, T3FREE, THYROIDAB in the last 72 hours. No results for input(s): VITAMINB12, FOLATE, FERRITIN, TIBC, IRON, RETICCTPCT in the last 72 hours. ------------------------------------------------------------------------------------------------------------------ Cardiac Enzymes No results for input(s): CKMB, TROPONINI, MYOGLOBIN in the last 168 hours.  Invalid input(s): CK  Micro Results No results found for this or any previous visit (from the past 240 hours).  Radiology Report DG CHEST PORT 1 VIEW Result Date: 12/21/2023 CLINICAL DATA:  Right-sided pneumothorax. EXAM: PORTABLE CHEST 1 VIEW COMPARISON:  12/20/2023 FINDINGS: Right pleural drain again noted without discernible right-sided pneumothorax. The lungs are clear without focal pneumonia, edema, or pleural effusion. The cardiopericardial silhouette is within normal limits for size. No acute bony abnormality. Telemetry leads overlie the  chest. IMPRESSION: Right pleural drain without discernible right-sided pneumothorax. Electronically Signed   By: Camellia Candle M.D.   On: 12/21/2023 09:00   DG Chest 1 View Result Date: 12/20/2023 CLINICAL DATA:  Pneumothorax. EXAM: CHEST  1 VIEW COMPARISON:  02/24/2020 and older studies. FINDINGS: Pigtail chest tube lies along the right lateral mid thoracic pleural space. No pneumothorax. There are linear opacities at the apices consistent with scarring, as well as lucency consistent with emphysema. Lungs are hyperexpanded, but otherwise clear. No pleural effusion. Cardiac silhouette is normal in size. No mediastinal or hilar masses. Skeletal structures are grossly intact. IMPRESSION: 1. Right-sided chest tube as detailed. No pneumothorax. No acute cardiopulmonary disease. 2. Emphysema. Electronically Signed   By: Alm Parkins M.D.   On: 12/20/2023 07:25     Signature  -   Lavada Stank M.D on 12/21/2023 at 9:34 AM   -  To page go to [email or web address]

## 2023-12-22 ENCOUNTER — Other Ambulatory Visit (HOSPITAL_COMMUNITY): Payer: Self-pay

## 2023-12-22 ENCOUNTER — Inpatient Hospital Stay (HOSPITAL_COMMUNITY)

## 2023-12-22 DIAGNOSIS — J9311 Primary spontaneous pneumothorax: Secondary | ICD-10-CM | POA: Diagnosis not present

## 2023-12-22 DIAGNOSIS — Z4682 Encounter for fitting and adjustment of non-vascular catheter: Secondary | ICD-10-CM | POA: Diagnosis not present

## 2023-12-22 DIAGNOSIS — J939 Pneumothorax, unspecified: Secondary | ICD-10-CM | POA: Diagnosis not present

## 2023-12-22 DIAGNOSIS — J439 Emphysema, unspecified: Secondary | ICD-10-CM | POA: Diagnosis not present

## 2023-12-22 DIAGNOSIS — J9383 Other pneumothorax: Secondary | ICD-10-CM | POA: Diagnosis not present

## 2023-12-22 LAB — MAGNESIUM: Magnesium: 2.1 mg/dL (ref 1.7–2.4)

## 2023-12-22 MED ORDER — ACETAMINOPHEN 500 MG PO TABS
500.0000 mg | ORAL_TABLET | Freq: Four times a day (QID) | ORAL | 0 refills | Status: AC | PRN
Start: 2023-12-22 — End: ?
  Filled 2023-12-22: qty 20, 5d supply, fill #0

## 2023-12-22 MED ORDER — IBUPROFEN 600 MG PO TABS
600.0000 mg | ORAL_TABLET | Freq: Three times a day (TID) | ORAL | 0 refills | Status: DC | PRN
  Filled 2023-12-22: qty 20, 7d supply, fill #0

## 2023-12-22 NOTE — Progress Notes (Addendum)
 [address]       [place] [address]             [phone]        Subjective: Remaons on 2 liters, breathing is relatively comfortable, some discomforts from tube  Objective: Vital signs in last 24 hours: Temp:  [97.4 F (36.3 C)-98.2 F (36.8 C)] 98.2 F (36.8 C) (07/05 0754) Pulse Rate:  [78-88] 78 (07/05 0754) Cardiac Rhythm: Normal sinus rhythm (07/04 2000) Resp:  [12-20] 16 (07/05 0754) BP: (132-145)/(90-102) 133/101 (07/05 0754) SpO2:  [93 %-98 %] 94 % (07/05 0754)  Hemodynamic parameters for last 24 hours:    Intake/Output from previous day: 07/04 0701 - 07/05 0700 In: 2100 [P.O.:2100] Out: 750 [Urine:750] Intake/Output this shift: No intake/output data recorded.  General appearance: alert, cooperative, and no distress Heart: regular rate and rhythm Lungs: clear, currently no wheeze Abdomen: benign Extremities: warm well perfused Wound: chest tube site ok  Lab Results: Recent Labs    12/19/23 1948 12/20/23 0550  WBC 12.8* 12.1*  HGB 14.8 13.7  HCT 42.7 40.3  PLT 277 226   BMET:  Recent Labs    12/20/23 0550  NA 137  K 4.1  CL 102  CO2 26  GLUCOSE 100*  BUN 15  CREATININE 0.69  CALCIUM 9.0    PT/INR: No results for input(s): LABPROT, INR in the last 72 hours. ABG No results found for: PHART, HCO3, TCO2, ACIDBASEDEF, O2SAT CBG (last 3)  No results for input(s): GLUCAP in the last 72 hours.  Meds Scheduled Meds:  enoxaparin  (LOVENOX ) injection  40 mg Subcutaneous Q24H   nicotine   21 mg Transdermal Daily   pantoprazole   40 mg Oral Daily   Continuous Infusions: PRN Meds:.acetaminophen  **OR** acetaminophen , albuterol , hydrALAZINE , morphine  injection, ondansetron  **OR** ondansetron  (ZOFRAN ) IV, traMADol , zolpidem   Xrays DG Chest 2 View Result Date: 12/22/2023 CLINICAL DATA:  48 year old male with right side pneumothorax, chest tube. EXAM: CHEST - 2 VIEW COMPARISON:  12/21/2023 and earlier.  FINDINGS: PA and lateral views 0635 hours. Right lateral pleural space pigtail catheter appears unchanged. No pneumothorax identified. Trace right chest wall soft tissue gas. Large lung volumes. Normal cardiac size and mediastinal contours. Visualized tracheal air column is within normal limits. Apical bullous emphysema on the right. Lung markings appear stable since 2021. No pleural effusion. No acute osseous abnormality identified. Negative visible bowel gas. IMPRESSION: 1. Stable right pleural pigtail catheter with no pneumothorax. Evidence of right apical bullous Emphysema (ICD10-J43.9). 2. No acute cardiopulmonary abnormality. Electronically Signed   By: VEAR Hurst M.D.   On: 12/22/2023 06:59   DG CHEST PORT 1 VIEW Result Date: 12/21/2023 CLINICAL DATA:  Right-sided pneumothorax. EXAM: PORTABLE CHEST 1 VIEW COMPARISON:  12/20/2023 FINDINGS: Right pleural drain again noted without discernible right-sided pneumothorax. The lungs are clear without focal pneumonia, edema, or pleural effusion. The cardiopericardial silhouette is within normal limits for size. No acute bony abnormality. Telemetry leads overlie the chest. IMPRESSION: Right pleural drain without discernible right-sided pneumothorax. Electronically Signed   By: Camellia Candle M.D.   On: 12/21/2023 09:00    Assessment/Plan:  1 afeb, VSS 2 sats ok on 2 liters 3 CXR- no pntx 4 CT- no air leak,  removed, patient tol well 5 will repeat CXR in a couple of hours, if no pntx can be discharged when medically ready per hospitalist   LOS: 3 days    Lemond FORBES Cera PA-C Pager [phone] 12/22/2023  Chart reviewed, patient examined, agree with above.

## 2023-12-22 NOTE — Progress Notes (Addendum)
 Patient Saturations on Room Air at Rest = 99%  Patient Saturations on Room Air while Ambulating = 87% to 91% No any signs of shortness of breath.MD aware.

## 2023-12-22 NOTE — Progress Notes (Signed)
 DISCHARGE NOTE HOME NOAM KARAFFA to be discharged Home per MD order. Discussed prescriptions and follow up appointments with the patient. Prescriptions given to patient; medication list explained in detail. Patient verbalized understanding.  Skin clean, dry and intact without evidence of skin break down, no evidence of skin tears noted. IV catheter discontinued intact. Site without signs and symptoms of complications. Dressing and pressure applied. Pt denies pain at the site currently. No complaints noted.  See Lda for incision at discharge Patient free of lines, drains, and wounds.   An After Visit Summary (AVS) was printed and given to the patient. Patient escorted via wheelchair, and discharged home via private auto.  Peyton SHAUNNA Pepper, RN

## 2023-12-22 NOTE — Discharge Summary (Signed)
 Albert Hill FMW:[id number] DOB: 1975/09/16 DOA: 12/19/2023  PCP: Patient, No Pcp Per  Admit date: 12/19/2023  Discharge date: 12/22/2023  Admitted From: Home   Disposition:  Home   Recommendations for Outpatient Follow-up:   Follow up with PCP in 1-2 weeks  PCP Please obtain BMP/CBC, 2 view CXR in 1week,  (see Discharge instructions)   PCP Please follow up on the following pending results:     Home Health: None   Equipment/Devices: None  Consultations: CVTS Discharge Condition: Stable    CODE STATUS: Full    Diet Recommendation: Heart Healthy     CC SOB   Brief history of present illness from the day of admission and additional interim summary    48 y.o. male with medical history significant of tobacco use, HTN, comes into the hospital with severe shortness of breath.  He has been feeling intermittently short of breath for the past 2 weeks but was able to go to work, be active without major issues, and shortness of breath has gotten maybe slightly worse in the last couple of days.  When he woke up this morning he felt really short of breath and decided to go to the emergency room, and went to [facility] ER.  He was found to be hypoxic requiring 10 L of oxygen , and a chest x-ray showed right-sided pneumothorax.  He had a chest tube placed in the ER and he was transferred to [facility] for cardiothoracic surgery evaluation.                                                                  Hospital Course   Right-sided pneumothorax -likely due to undiagnosed underlying COPD/emphysema, he is s/p chest tube placement on the right side at an outlying ER, cardiothoracic surgery following the patient and discontinue the chest tube on 12/22/2023, he is now feeling a whole lot better, if repeat chest x-ray stable he will be  discharged home with outpatient follow-up with PCP and pulmonary, strictly counseled to abstain from smoking.  All home medications will be continued unchanged, request PCP to review his home medications as many of them he was not taking for several months.  Tobacco use-continue nicotine  patch.  Albuterol  PRN, counseled to quit smoking   History of hypertension-continue home regimen   GERD.  Continue home regimen    Discharge diagnosis     Principal Problem:   Pneumothorax    Discharge instructions    Discharge Instructions     Diet - low sodium heart healthy   Complete by: As directed    Discharge instructions   Complete by: As directed    Keep your right sided chest tube site clean and dry at all times.    Follow with Primary MD  in 2-3 days  Get CBC, CMP, 2 view Chest X ray -  checked next visit with your primary MD   Activity: As tolerated with Full fall precautions use walker/cane & assistance as needed  Disposition Home    Diet: Heart Healthy   Special Instructions: If you have smoked or chewed Tobacco  in the last 2 yrs please stop smoking, stop any regular Alcohol  and or any Recreational drug use.  On your next visit with your primary care physician please Get Medicines reviewed and adjusted.  Please request your Prim.MD to go over all Hospital Tests and Procedure/Radiological results at the follow up, please get all Hospital records sent to your Prim MD by signing hospital release before you go home.  If you experience worsening of your admission symptoms, develop shortness of breath, life threatening emergency, suicidal or homicidal thoughts you must seek medical attention immediately by calling 911 or calling your MD immediately  if symptoms less severe.  You Must read complete instructions/literature along with all the possible adverse reactions/side effects for all the Medicines you take and that have been prescribed to you. Take any new Medicines after you  have completely understood and accpet all the possible adverse reactions/side effects.   Do not drive when taking Pain medications.  Do not take more than prescribed Pain, Sleep and Anxiety Medications  Wear Seat belts while driving.   Increase activity slowly   Complete by: As directed        Discharge Medications   Allergies as of 12/22/2023   No Known Allergies      Medication List     STOP taking these medications    allopurinol  300 MG tablet Commonly known as: ZYLOPRIM    gabapentin  300 MG capsule Commonly known as: NEURONTIN    lisinopril  20 MG tablet Commonly known as: ZESTRIL    omeprazole  20 MG capsule Commonly known as: PRILOSEC   ZENPEP PO       TAKE these medications    acetaminophen  500 MG tablet Commonly known as: TYLENOL  Take 1 tablet (500 mg total) by mouth every 6 (six) hours as needed for mild pain (pain score 1-3) or moderate pain (pain score 4-6). What changed:  how much to take reasons to take this   albuterol  (2.5 MG/3ML) 0.083% nebulizer solution Commonly known as: PROVENTIL  Take 2.5 mg by nebulization every 6 (six) hours as needed for shortness of breath or wheezing.   Ventolin  HFA 108 (90 Base) MCG/ACT inhaler Generic drug: albuterol  Inhale 2 puffs into the lungs See admin instructions. Every 4-6 hours prn   ibuprofen  600 MG tablet Commonly known as: ADVIL  Take 1 tablet (600 mg total) by mouth every 8 (eight) hours as needed for moderate pain (pain score 4-6).   sildenafil  100 MG tablet Commonly known as: VIAGRA  Take 100 mg by mouth daily as needed for erectile dysfunction.         Follow-up Information     [facility]. Schedule an appointment as soon as possible for a visit in 2 day(s).   Specialty: Family Medicine Why: Please clal to make a appointment to establish a primary care doctor Contact information: [address] [place]   [address] [phone] Additional information: [address]  [address]  [place], [place] [address]    - Parking lot with [employer], [employer], [employer]; Next to [facility]   - [facility] shopping - Off of [address]  Lucas Dorise POUR, MD Follow up.   Specialty: Cardiothoracic Surgery Why: Office will contact you for an appointment.  You will also have a chest x-ray performed 1 hour prior to the appointment in the same office complex. Contact information: [address] [place] [address] [phone]         Annella Donnice SAUNDERS, MD. Schedule an appointment as soon as possible for a visit in 2 week(s).   Specialty: Pulmonary Disease Contact information: [address] [place] [address] [phone]                 Major procedures and Radiology Reports - PLEASE review detailed and final reports thoroughly  -      DG Chest 2 View Result Date: 12/22/2023 CLINICAL DATA:  48 year old male with right side pneumothorax, chest tube. EXAM: CHEST - 2 VIEW COMPARISON:  12/21/2023 and earlier. FINDINGS: PA and lateral views 0635 hours. Right lateral pleural space pigtail catheter appears unchanged. No pneumothorax identified. Trace right chest wall soft tissue gas. Large lung volumes. Normal cardiac size and mediastinal contours. Visualized tracheal air column is within normal limits. Apical bullous emphysema on the right. Lung markings appear stable since 2021. No pleural effusion. No acute osseous abnormality identified. Negative visible bowel gas. IMPRESSION: 1. Stable right pleural pigtail catheter with no pneumothorax. Evidence of right apical bullous Emphysema (ICD10-J43.9). 2. No acute cardiopulmonary abnormality. Electronically Signed   By: VEAR Hurst M.D.   On: 12/22/2023 06:59   DG CHEST PORT 1 VIEW Result Date: 12/21/2023 CLINICAL DATA:  Right-sided pneumothorax. EXAM: PORTABLE CHEST 1 VIEW COMPARISON:   12/20/2023 FINDINGS: Right pleural drain again noted without discernible right-sided pneumothorax. The lungs are clear without focal pneumonia, edema, or pleural effusion. The cardiopericardial silhouette is within normal limits for size. No acute bony abnormality. Telemetry leads overlie the chest. IMPRESSION: Right pleural drain without discernible right-sided pneumothorax. Electronically Signed   By: Camellia Candle M.D.   On: 12/21/2023 09:00   DG Chest 1 View Result Date: 12/20/2023 CLINICAL DATA:  Pneumothorax. EXAM: CHEST  1 VIEW COMPARISON:  02/24/2020 and older studies. FINDINGS: Pigtail chest tube lies along the right lateral mid thoracic pleural space. No pneumothorax. There are linear opacities at the apices consistent with scarring, as well as lucency consistent with emphysema. Lungs are hyperexpanded, but otherwise clear. No pleural effusion. Cardiac silhouette is normal in size. No mediastinal or hilar masses. Skeletal structures are grossly intact. IMPRESSION: 1. Right-sided chest tube as detailed. No pneumothorax. No acute cardiopulmonary disease. 2. Emphysema. Electronically Signed   By: Alm Parkins M.D.   On: 12/20/2023 07:25    Micro Results     No results found for this or any previous visit (from the past 240 hours).  Today   Subjective    Albert Hill today has no headache,no chest abdominal pain,no new weakness tingling or numbness, feels much better wants to go home today.     Objective   Blood pressure (!) 133/101, pulse 78, temperature 98.2 F (36.8 C), temperature source Oral, resp. rate 16, height 5' 10 (1.778 m), SpO2 94%.   Intake/Output Summary (Last 24 hours) at 12/22/2023 1001 Last data filed at 12/21/2023 2100 Gross per 24 hour  Intake 2100 ml  Output --  Net 2100 ml    Exam  Awake Alert, No new F.N deficits,    Albert Hill.AT,PERRAL Supple Neck,   Symmetrical Chest wall movement, Good air movement bilaterally, CTAB RRR,No Gallops,   +ve B.Sounds,  Abd Soft, Non tender,  No Cyanosis, Clubbing or edema    Data Review   Recent Labs  Lab 12/19/23 1948 12/20/23 0550  WBC 12.8* 12.1*  HGB 14.8 13.7  HCT 42.7 40.3  PLT 277 226  MCV 90.3 91.6  MCH 31.3 31.1  MCHC 34.7 34.0  RDW 13.0 13.1  LYMPHSABS 1.4  --   MONOABS 0.4  --   EOSABS 0.0  --   BASOSABS 0.0  --     Recent Labs  Lab 12/20/23 0550 12/21/23 0504 12/22/23 0405  NA 137  --   --   K 4.1  --   --   CL 102  --   --   CO2 26  --   --   ANIONGAP 9  --   --   GLUCOSE 100*  --   --   BUN 15  --   --   CREATININE 0.69  --   --   AST 18  --   --   ALT 17  --   --   ALKPHOS 76  --   --   BILITOT 0.3  --   --   ALBUMIN 3.5  --   --   MG  --  2.0 2.1  CALCIUM 9.0  --   --     Total Time in preparing paper work, data evaluation and todays exam - 35 minutes  Signature  -    Albert Hill M.D on 12/22/2023 at 10:01 AM   -  To page go to [email or web address]

## 2023-12-22 NOTE — Discharge Instructions (Addendum)
 Keep your right sided chest tube site clean and dry at all times.    Follow with Primary MD  in 2-3 days   Get CBC, CMP, 2 view Chest X ray -  checked next visit with your primary MD   Activity: As tolerated with Full fall precautions use walker/cane & assistance as needed  Disposition Home    Diet: Heart Healthy   Special Instructions: If you have smoked or chewed Tobacco  in the last 2 yrs please stop smoking, stop any regular Alcohol  and or any Recreational drug use.  On your next visit with your primary care physician please Get Medicines reviewed and adjusted.  Please request your Prim.MD to go over all Hospital Tests and Procedure/Radiological results at the follow up, please get all Hospital records sent to your Prim MD by signing hospital release before you go home.  If you experience worsening of your admission symptoms, develop shortness of breath, life threatening emergency, suicidal or homicidal thoughts you must seek medical attention immediately by calling 911 or calling your MD immediately  if symptoms less severe.  You Must read complete instructions/literature along with all the possible adverse reactions/side effects for all the Medicines you take and that have been prescribed to you. Take any new Medicines after you have completely understood and accpet all the possible adverse reactions/side effects.   Do not drive when taking Pain medications.  Do not take more than prescribed Pain, Sleep and Anxiety Medications  Wear Seat belts while driving.

## 2023-12-22 NOTE — Plan of Care (Signed)

## 2023-12-24 ENCOUNTER — Encounter: Payer: Self-pay | Admitting: Family

## 2023-12-24 ENCOUNTER — Ambulatory Visit (INDEPENDENT_AMBULATORY_CARE_PROVIDER_SITE_OTHER): Admitting: Family

## 2023-12-24 VITALS — BP 126/89 | HR 95 | Temp 98.7°F | Resp 16 | Ht 70.0 in | Wt 154.6 lb

## 2023-12-24 DIAGNOSIS — Z720 Tobacco use: Secondary | ICD-10-CM

## 2023-12-24 DIAGNOSIS — Z139 Encounter for screening, unspecified: Secondary | ICD-10-CM

## 2023-12-24 DIAGNOSIS — N529 Male erectile dysfunction, unspecified: Secondary | ICD-10-CM | POA: Diagnosis not present

## 2023-12-24 DIAGNOSIS — F17210 Nicotine dependence, cigarettes, uncomplicated: Secondary | ICD-10-CM

## 2023-12-24 DIAGNOSIS — K219 Gastro-esophageal reflux disease without esophagitis: Secondary | ICD-10-CM | POA: Diagnosis not present

## 2023-12-24 DIAGNOSIS — I10 Essential (primary) hypertension: Secondary | ICD-10-CM

## 2023-12-24 DIAGNOSIS — M109 Gout, unspecified: Secondary | ICD-10-CM

## 2023-12-24 DIAGNOSIS — F418 Other specified anxiety disorders: Secondary | ICD-10-CM

## 2023-12-24 DIAGNOSIS — Z09 Encounter for follow-up examination after completed treatment for conditions other than malignant neoplasm: Secondary | ICD-10-CM

## 2023-12-24 DIAGNOSIS — J939 Pneumothorax, unspecified: Secondary | ICD-10-CM

## 2023-12-24 DIAGNOSIS — Z23 Encounter for immunization: Secondary | ICD-10-CM | POA: Diagnosis not present

## 2023-12-24 DIAGNOSIS — Z7689 Persons encountering health services in other specified circumstances: Secondary | ICD-10-CM

## 2023-12-24 DIAGNOSIS — F419 Anxiety disorder, unspecified: Secondary | ICD-10-CM

## 2023-12-24 MED ORDER — LISINOPRIL 20 MG PO TABS
20.0000 mg | ORAL_TABLET | Freq: Every day | ORAL | 0 refills | Status: DC
Start: 2023-12-24 — End: 2024-01-15

## 2023-12-24 MED ORDER — SILDENAFIL CITRATE 100 MG PO TABS: ORAL_TABLET | ORAL | 0 refills | Status: AC

## 2023-12-24 MED ORDER — TRAZODONE HCL 50 MG PO TABS: 25.0000 mg | ORAL_TABLET | Freq: Every evening | ORAL | 0 refills | Status: DC | PRN

## 2023-12-24 MED ORDER — ALLOPURINOL 300 MG PO TABS: 300.0000 mg | ORAL_TABLET | Freq: Every day | ORAL | 0 refills | Status: DC

## 2023-12-24 MED ORDER — GABAPENTIN 300 MG PO CAPS: 300.0000 mg | ORAL_CAPSULE | Freq: Every day | ORAL | 0 refills | Status: DC

## 2023-12-24 MED ORDER — OMEPRAZOLE 20 MG PO CPDR: 20.0000 mg | DELAYED_RELEASE_CAPSULE | Freq: Every day | ORAL | 0 refills | Status: DC

## 2023-12-24 MED ORDER — VENTOLIN HFA 108 (90 BASE) MCG/ACT IN AERS: 2.0000 | INHALATION_SPRAY | RESPIRATORY_TRACT | 2 refills | Status: DC

## 2023-12-24 NOTE — Progress Notes (Signed)
 Subjective:    Albert Hill - 48 y.o. male MRN [id number]  Date of birth: 06-09-1976  HPI  Albert Hill is to establish care and hospital discharge follow-up.  Current issues and/or concerns: - Patient seen on 12/19/2023 - 12/22/2023 at [facility] for pneumothorax, tobacco use, primary hypertension, and GERD. States feeling improved since hospital discharge. Denies red flag symptoms. States he has an appointment with Cardiothoracic on 01/09/2024. He has not scheduled an appointment with Pulmonology as of present. States he is no longer smoking. - Doing well on Lisinopril , no issues/concerns. He does not complain of red flag symptoms such as but not limited to chest pain, shortness of breath, worst headache of life, nausea/vomiting.  - Doing well on Omeprazole , no issues/concerns. - Anxiety depression related to work-life balance. States difficulty sleeping. He would like referral to a therapist. He denies thoughts of self-harm, suicidal ideations, homicidal ideations. - Doing well on Allopurinol  and Gabapentin  for gout. Denies red flag symptoms. - Doing well on Sildenafil  for erectile dysfunction.  - No further issues/concerns for discussion today.  ROS per HPI    Health Maintenance:  Health Maintenance Due  Topic Date Due   Hepatitis C Screening  Never done     Past Medical History: Patient Active Problem List   Diagnosis Date Noted   Pneumothorax 12/19/2023   Non-recurrent bilateral inguinal hernia without obstruction or gangrene    S/P lumbar discectomy 09/16/2021   Asthma 07/22/2021   Nausea 12/23/2020   Abdominal pain, epigastric 12/23/2020   Loss of weight 12/23/2020   Colon cancer screening 12/23/2020   Gout of ankle 04/11/2018   Penile lesion 04/11/2018   Essential hypertension 01/31/2018      Social History   reports that he has been smoking cigarettes. He has a 60 pack-year smoking history. He has been exposed to tobacco smoke. His  smokeless tobacco use includes chew. He reports that he does not currently use alcohol. He reports current drug use. Drug: Marijuana.   Family History  family history includes Asthma in his son; Lung cancer in his father.   Medications: reviewed and updated   Objective:   Physical Exam BP 126/89   Pulse 95   Temp 98.7 F (37.1 C) (Oral)   Resp 16   Ht 5' 10 (1.778 m)   Wt 154 lb 9.6 oz (70.1 kg)   SpO2 94%   BMI 22.18 kg/m   Physical Exam HENT:     Head: Normocephalic and atraumatic.     Nose: Nose normal.     Mouth/Throat:     Mouth: Mucous membranes are moist.     Pharynx: Oropharynx is clear.  Eyes:     Extraocular Movements: Extraocular movements intact.     Conjunctiva/sclera: Conjunctivae normal.     Pupils: Pupils are equal, round, and reactive to light.  Cardiovascular:     Rate and Rhythm: Normal rate and regular rhythm.     Pulses: Normal pulses.     Heart sounds: Normal heart sounds.  Pulmonary:     Effort: Pulmonary effort is normal.     Breath sounds: Normal breath sounds.  Chest:     Comments: Surgical site right chest clean/dry/intact. Musculoskeletal:        General: Normal range of motion.     Cervical back: Normal range of motion and neck supple.  Neurological:     General: No focal deficit present.     Mental Status: He is alert and oriented to  person, place, and time.  Psychiatric:        Mood and Affect: Mood normal.        Behavior: Behavior normal.     Assessment & Plan:  1. Encounter to establish care (Primary) 2. Hospital discharge follow-up - Reviewed hospital course, current medications, ensured proper follow-up in place, and addressed concerns.  - Routine screening. Patient plans to schedule lab only appointment at later date for lab collection. - Basic Metabolic Panel; Future - CBC; Future  3. Pneumothorax, unspecified type - Patient today in office with no cardiopulmonary/acute distress.  - Continue present management. -  Continue Ventolin  HFA inhaler as prescribed. Counseled on medication adherence/adverse effects.  - Keep all scheduled appointments with Cardiothoracic Surgery. - Referral to Pulmonology for evaluation/management.  - Follow-up with primary provider as scheduled. - Diagnostic chest xray for evaluation. Patient plans to schedule an xray only appointment at later date. - Ambulatory referral to Pulmonology - VENTOLIN  HFA 108 (90 Base) MCG/ACT inhaler; Inhale 2 puffs into the lungs See admin instructions. Every 4-6 hours prn  Dispense: 18 g; Refill: 2 - DG Chest 2 View; Future  4. Tobacco use - Counseled to quit. - Continue present management. No refills needed as of present.  5. Primary hypertension - Continue Lisinopril  as prescribed.  - Counseled on blood pressure goal of less than 130/80, low-sodium, DASH diet, medication compliance, and 150 minutes of moderate intensity exercise per week as tolerated. Counseled on medication adherence and adverse effects. - Follow-up with primary provider in 3 months or sooner if needed.  - lisinopril  (ZESTRIL ) 20 MG tablet; Take 1 tablet (20 mg total) by mouth daily.  Dispense: 90 tablet; Refill: 0  6. Gastroesophageal reflux disease, unspecified whether esophagitis present - Continue Omeprazole  as prescribed. Counseled on medication adherence/adverse effects.  - Follow-up with primary provider in 3 months or sooner if needed.  - omeprazole  (PRILOSEC) 20 MG capsule; Take 1 capsule (20 mg total) by mouth daily.  Dispense: 90 capsule; Refill: 0  7. Anxiety and depression - Patient denies thoughts of self-harm, suicidal ideations, homicidal ideations. - Trazodone  as prescribed. Counseled on medication adherence/adverse effects.  - Referral to Psychiatry for evaluation/management.  - Follow-up with primary provider as scheduled. - Ambulatory referral to Psychiatry - traZODone  (DESYREL ) 50 MG tablet; Take 0.5-1 tablets (25-50 mg total) by mouth at bedtime as  needed for sleep.  Dispense: 90 tablet; Refill: 0  8. Gout, unspecified cause, unspecified chronicity, unspecified site - Continue Allopurinol  and Gabapentin  as prescribed. Counseled on medication adherence/adverse effects.  - Follow-up with primary provider in 3 months or sooner if needed.  - allopurinol  (ZYLOPRIM ) 300 MG tablet; Take 1 tablet (300 mg total) by mouth daily.  Dispense: 90 tablet; Refill: 0 - gabapentin  (NEURONTIN ) 300 MG capsule; Take 1 capsule (300 mg total) by mouth daily.  Dispense: 90 capsule; Refill: 0  9. Erectile dysfunction, unspecified erectile dysfunction type - Continue Sildenafil  as prescribed. Counseled on medication adherence/adverse effects.  - Follow-up with primary provider in 3 months or sooner if needed.  - sildenafil  (VIAGRA ) 100 MG tablet; Take 1 tablet 1/2 hour to 1 hour prior to intercourse as needed. Limit use to 1/2 tablet or 1 tablet per 24 hours.  Dispense: 90 tablet; Refill: 0  10. Encounter for screening involving social determinants of health (SDoH) - Referral to [facility] Care Management for community resources. - AMB Referral [facility] Care Management  11. Immunization due - Administered. - Tdap vaccine greater than or equal to  7yo IM   Patient was given clear instructions to go to Emergency Department or return to medical center if symptoms don't improve, worsen, or new problems develop.The patient verbalized understanding.  I discussed the assessment and treatment plan with the patient. The patient was provided an opportunity to ask questions and all were answered. The patient agreed with the plan and demonstrated an understanding of the instructions.   The patient was advised to call back or seek an in-person evaluation if the symptoms worsen or if the condition fails to improve as anticipated.    Greig Drones, NP 12/24/2023, 4:00 PM Primary Care at [facility]

## 2023-12-24 NOTE — Progress Notes (Signed)
 Patient was release from the hospital  last Tuesday with a collapsed lung, patient needs a emergency inhaler.  Patient PHQ-9 scored a 16.Patient scored a GAD-7  20

## 2023-12-27 ENCOUNTER — Telehealth: Payer: Self-pay

## 2023-12-27 NOTE — Progress Notes (Signed)
   Telephone encounter was:  Unsuccessful.  12/27/2023 Name: Albert Hill MRN: [id number] DOB: 12/09/1975  Unsuccessful outbound call made today to assist with:  Food Insecurity and Financial Difficulties related to financial strain  Outreach Attempt:  1st Attempt  No answer and unable to leave a message    Jon Colt [facility]  [facility] Guide, Phone: [phone] Fax: [phone] Website: [email or web address]

## 2023-12-29 ENCOUNTER — Emergency Department (HOSPITAL_COMMUNITY)

## 2023-12-29 ENCOUNTER — Encounter (HOSPITAL_COMMUNITY): Payer: Self-pay | Admitting: Emergency Medicine

## 2023-12-29 ENCOUNTER — Inpatient Hospital Stay (HOSPITAL_COMMUNITY)
Admission: EM | Admit: 2023-12-29 | Discharge: 2024-01-15 | DRG: 163 | Disposition: A | Discharge status: Home Health | Attending: Student | Admitting: Student

## 2023-12-29 DIAGNOSIS — J449 Chronic obstructive pulmonary disease, unspecified: Secondary | ICD-10-CM | POA: Diagnosis not present

## 2023-12-29 DIAGNOSIS — Z7951 Long term (current) use of inhaled steroids: Secondary | ICD-10-CM | POA: Diagnosis not present

## 2023-12-29 DIAGNOSIS — N179 Acute kidney failure, unspecified: Secondary | ICD-10-CM | POA: Diagnosis not present

## 2023-12-29 DIAGNOSIS — R918 Other nonspecific abnormal finding of lung field: Secondary | ICD-10-CM | POA: Diagnosis not present

## 2023-12-29 DIAGNOSIS — Z801 Family history of malignant neoplasm of trachea, bronchus and lung: Secondary | ICD-10-CM | POA: Diagnosis not present

## 2023-12-29 DIAGNOSIS — J439 Emphysema, unspecified: Secondary | ICD-10-CM | POA: Diagnosis not present

## 2023-12-29 DIAGNOSIS — J440 Chronic obstructive pulmonary disease with acute lower respiratory infection: Secondary | ICD-10-CM | POA: Diagnosis not present

## 2023-12-29 DIAGNOSIS — T39395A Adverse effect of other nonsteroidal anti-inflammatory drugs [NSAID], initial encounter: Secondary | ICD-10-CM | POA: Diagnosis present

## 2023-12-29 DIAGNOSIS — J948 Other specified pleural conditions: Secondary | ICD-10-CM | POA: Diagnosis not present

## 2023-12-29 DIAGNOSIS — J189 Pneumonia, unspecified organism: Secondary | ICD-10-CM | POA: Diagnosis not present

## 2023-12-29 DIAGNOSIS — J431 Panlobular emphysema: Secondary | ICD-10-CM

## 2023-12-29 DIAGNOSIS — R9389 Abnormal findings on diagnostic imaging of other specified body structures: Secondary | ICD-10-CM | POA: Diagnosis not present

## 2023-12-29 DIAGNOSIS — K219 Gastro-esophageal reflux disease without esophagitis: Secondary | ICD-10-CM | POA: Diagnosis present

## 2023-12-29 DIAGNOSIS — J9383 Other pneumothorax: Secondary | ICD-10-CM | POA: Diagnosis not present

## 2023-12-29 DIAGNOSIS — G4733 Obstructive sleep apnea (adult) (pediatric): Secondary | ICD-10-CM | POA: Diagnosis present

## 2023-12-29 DIAGNOSIS — J9311 Primary spontaneous pneumothorax: Secondary | ICD-10-CM | POA: Diagnosis not present

## 2023-12-29 DIAGNOSIS — I10 Essential (primary) hypertension: Secondary | ICD-10-CM | POA: Diagnosis not present

## 2023-12-29 DIAGNOSIS — R0989 Other specified symptoms and signs involving the circulatory and respiratory systems: Secondary | ICD-10-CM | POA: Diagnosis not present

## 2023-12-29 DIAGNOSIS — J9382 Other air leak: Secondary | ICD-10-CM | POA: Diagnosis not present

## 2023-12-29 DIAGNOSIS — F17210 Nicotine dependence, cigarettes, uncomplicated: Secondary | ICD-10-CM | POA: Diagnosis not present

## 2023-12-29 DIAGNOSIS — J90 Pleural effusion, not elsewhere classified: Secondary | ICD-10-CM | POA: Diagnosis not present

## 2023-12-29 DIAGNOSIS — Z720 Tobacco use: Secondary | ICD-10-CM

## 2023-12-29 DIAGNOSIS — Z6823 Body mass index (BMI) 23.0-23.9, adult: Secondary | ICD-10-CM | POA: Diagnosis not present

## 2023-12-29 DIAGNOSIS — E43 Unspecified severe protein-calorie malnutrition: Secondary | ICD-10-CM | POA: Diagnosis present

## 2023-12-29 DIAGNOSIS — Z4682 Encounter for fitting and adjustment of non-vascular catheter: Secondary | ICD-10-CM | POA: Diagnosis not present

## 2023-12-29 DIAGNOSIS — I959 Hypotension, unspecified: Secondary | ICD-10-CM | POA: Diagnosis not present

## 2023-12-29 DIAGNOSIS — J181 Lobar pneumonia, unspecified organism: Secondary | ICD-10-CM | POA: Diagnosis not present

## 2023-12-29 DIAGNOSIS — F1210 Cannabis abuse, uncomplicated: Secondary | ICD-10-CM | POA: Diagnosis not present

## 2023-12-29 DIAGNOSIS — Z419 Encounter for procedure for purposes other than remedying health state, unspecified: Secondary | ICD-10-CM | POA: Diagnosis not present

## 2023-12-29 DIAGNOSIS — Z79899 Other long term (current) drug therapy: Secondary | ICD-10-CM | POA: Diagnosis not present

## 2023-12-29 DIAGNOSIS — J9819 Other pulmonary collapse: Secondary | ICD-10-CM | POA: Diagnosis not present

## 2023-12-29 DIAGNOSIS — Z716 Tobacco abuse counseling: Secondary | ICD-10-CM

## 2023-12-29 DIAGNOSIS — J9601 Acute respiratory failure with hypoxia: Secondary | ICD-10-CM | POA: Diagnosis not present

## 2023-12-29 DIAGNOSIS — L03313 Cellulitis of chest wall: Secondary | ICD-10-CM | POA: Diagnosis not present

## 2023-12-29 DIAGNOSIS — J939 Pneumothorax, unspecified: Secondary | ICD-10-CM | POA: Diagnosis not present

## 2023-12-29 DIAGNOSIS — Z825 Family history of asthma and other chronic lower respiratory diseases: Secondary | ICD-10-CM | POA: Diagnosis not present

## 2023-12-29 DIAGNOSIS — J930 Spontaneous tension pneumothorax: Secondary | ICD-10-CM | POA: Diagnosis not present

## 2023-12-29 DIAGNOSIS — R0602 Shortness of breath: Secondary | ICD-10-CM | POA: Diagnosis not present

## 2023-12-29 DIAGNOSIS — J9811 Atelectasis: Secondary | ICD-10-CM | POA: Diagnosis not present

## 2023-12-29 DIAGNOSIS — M109 Gout, unspecified: Secondary | ICD-10-CM | POA: Diagnosis present

## 2023-12-29 DIAGNOSIS — R609 Edema, unspecified: Secondary | ICD-10-CM | POA: Diagnosis not present

## 2023-12-29 DIAGNOSIS — I700 Atherosclerosis of aorta: Secondary | ICD-10-CM | POA: Diagnosis not present

## 2023-12-29 LAB — BASIC METABOLIC PANEL WITH GFR
Anion gap: 3 — ABNORMAL LOW (ref 5–15)
BUN: 24 mg/dL — ABNORMAL HIGH (ref 6–20)
CO2: 30 mmol/L (ref 22–32)
Calcium: 9.1 mg/dL (ref 8.9–10.3)
Chloride: 104 mmol/L (ref 98–111)
Creatinine, Ser: 0.94 mg/dL (ref 0.61–1.24)
GFR, Estimated: >60 mL/min (ref >60)
Glucose, Bld: 120 mg/dL — ABNORMAL HIGH (ref 70–99)
Potassium: 4.2 mmol/L (ref 3.5–5.1)
Sodium: 137 mmol/L (ref 135–145)

## 2023-12-29 LAB — TROPONIN I (HIGH SENSITIVITY)
Troponin I (High Sensitivity): 8 ng/L (ref <18)
Troponin I (High Sensitivity): 9 ng/L (ref <18)

## 2023-12-29 LAB — CBC
HCT: 40.3 % (ref 39.0–52.0)
Hemoglobin: 13.8 g/dL (ref 13.0–17.0)
MCH: 31 pg (ref 26.0–34.0)
MCHC: 34.2 g/dL (ref 30.0–36.0)
MCV: 90.6 fL (ref 80.0–100.0)
Platelets: 335 K/uL (ref 150–400)
RBC: 4.45 MIL/uL (ref 4.22–5.81)
RDW: 12.8 % (ref 11.5–15.5)
WBC: 12 K/uL — ABNORMAL HIGH (ref 4.0–10.5)
nRBC: 0 % (ref 0.0–0.2)

## 2023-12-29 MED ORDER — DIAZEPAM 5 MG/ML IJ SOLN
5.0000 mg | Freq: Once | INTRAMUSCULAR | Status: AC
  Administered 2023-12-29: 5 mg via INTRAVENOUS

## 2023-12-29 MED ORDER — FENTANYL CITRATE PF 50 MCG/ML IJ SOSY
50.0000 ug | PREFILLED_SYRINGE | Freq: Once | INTRAMUSCULAR | Status: AC
  Administered 2023-12-29: 50 ug via INTRAVENOUS
  Filled 2023-12-29: qty 1

## 2023-12-29 MED ORDER — ONDANSETRON HCL 4 MG/2ML IJ SOLN: 4.0000 mg | Freq: Four times a day (QID) | INTRAMUSCULAR | Status: DC | PRN

## 2023-12-29 MED ORDER — ACETAMINOPHEN 650 MG RE SUPP: 650.0000 mg | Freq: Four times a day (QID) | RECTAL | Status: DC | PRN

## 2023-12-29 MED ORDER — LIDOCAINE HCL 2 % IJ SOLN
10.0000 mL | Freq: Once | INTRAMUSCULAR | Status: AC
  Administered 2023-12-29: 200 mg
  Filled 2023-12-29: qty 20

## 2023-12-29 MED ORDER — HYDROMORPHONE HCL 1 MG/ML IJ SOLN
1.0000 mg | Freq: Once | INTRAMUSCULAR | Status: AC
  Administered 2023-12-29: 1 mg via INTRAVENOUS
  Filled 2023-12-29: qty 1

## 2023-12-29 MED ORDER — SODIUM CHLORIDE 0.9 % IV BOLUS
1000.0000 mL | Freq: Once | INTRAVENOUS | Status: AC
  Administered 2023-12-29: 1000 mL via INTRAVENOUS

## 2023-12-29 MED ORDER — MORPHINE SULFATE (PF) 4 MG/ML IV SOLN: 4.0000 mg | Freq: Once | INTRAVENOUS | Status: DC

## 2023-12-29 MED ORDER — ONDANSETRON HCL 4 MG PO TABS: 4.0000 mg | ORAL_TABLET | Freq: Four times a day (QID) | ORAL | Status: DC | PRN

## 2023-12-29 MED ORDER — HYDROMORPHONE HCL 1 MG/ML IJ SOLN
0.5000 mg | INTRAMUSCULAR | Status: DC | PRN
  Administered 2023-12-29 – 2023-12-30 (×3): 0.5 mg via INTRAVENOUS
  Filled 2023-12-29 (×3): qty 0.5
  Filled 2023-12-29: qty 1

## 2023-12-29 MED ORDER — ACETAMINOPHEN 325 MG PO TABS
650.0000 mg | ORAL_TABLET | Freq: Four times a day (QID) | ORAL | Status: DC | PRN
  Administered 2023-12-30: 650 mg via ORAL
  Filled 2023-12-29: qty 2

## 2023-12-29 MED ORDER — HYDROMORPHONE HCL 1 MG/ML IJ SOLN
0.5000 mg | Freq: Once | INTRAMUSCULAR | Status: AC
  Administered 2023-12-29: 0.5 mg via INTRAVENOUS
  Filled 2023-12-29: qty 0.5

## 2023-12-29 MED ORDER — LACTATED RINGERS IV SOLN: INTRAVENOUS | Status: AC

## 2023-12-29 MED ORDER — TRAZODONE HCL 50 MG PO TABS
25.0000 mg | ORAL_TABLET | Freq: Every evening | ORAL | Status: AC | PRN
  Administered 2023-12-29 – 2024-01-03 (×3): 25 mg via ORAL
  Filled 2023-12-29 (×3): qty 1

## 2023-12-29 MED ORDER — DIAZEPAM 5 MG/ML IJ SOLN
2.5000 mg | Freq: Once | INTRAMUSCULAR | Status: AC
  Administered 2023-12-29: 2.5 mg via INTRAVENOUS

## 2023-12-29 MED ORDER — DIAZEPAM 5 MG/ML IJ SOLN
2.5000 mg | Freq: Once | INTRAMUSCULAR | Status: AC
  Administered 2023-12-29: 2.5 mg via INTRAVENOUS
  Filled 2023-12-29: qty 2

## 2023-12-29 NOTE — ED Provider Notes (Signed)
 [place] EMERGENCY DEPARTMENT AT [facility] Provider Note   CSN: [id number] Arrival date & time: 12/29/23  1649   Patient presents with: Shortness of Breath   Albert Hill is a 48 y.o. male with PMHx of HTN, tobacco use, and recent R-sided pneumothorax for which he was admitted 3 week ago. Patient states he was feeling well since discharge until suddenly today around 12pm while sitting down talking with someone, when he felt abruptly short of breath. Denies any recent falls/trauma. States he had essentially quit smoking during recent admission and since discharge has smoked only 2 cigarettes, inluding half of one yesterday. He went to get his albuterol  inhaler for his sudden SOB but could not find it, and was having worsening chest tightness, so he drove himself to the ED. Denies any recent fevers, cough, N/V, or preceding chest pain prior to onset of the SOB. He is not on O2 at home and doesn't has a mild COPD diagnosis for which he only has a rescue albuterol  inhaler.   Prior to Admission medications   Medication Sig Start Date End Date Taking? Authorizing Provider  albuterol  (PROVENTIL ) (2.5 MG/3ML) 0.083% nebulizer solution Take 2.5 mg by nebulization every 6 (six) hours as needed for shortness of breath or wheezing.   Yes [provider]  allopurinol  (ZYLOPRIM ) 300 MG tablet Take 1 tablet (300 mg total) by mouth daily. 12/24/23  Yes Lorren, Amy J, NP  gabapentin  (NEURONTIN ) 300 MG capsule Take 1 capsule (300 mg total) by mouth daily. 12/24/23  Yes Lorren, Amy J, NP  lisinopril  (ZESTRIL ) 20 MG tablet Take 1 tablet (20 mg total) by mouth daily. 12/24/23  Yes Lorren, Amy J, NP  omeprazole  (PRILOSEC) 20 MG capsule Take 1 capsule (20 mg total) by mouth daily. 12/24/23 03/23/24 Yes Lorren, Amy J, NP  sildenafil  (VIAGRA ) 100 MG tablet Take 1 tablet 1/2 hour to 1 hour prior to intercourse as needed. Limit use to 1/2 tablet or 1 tablet per 24 hours. 12/24/23  Yes Lorren, Amy  J, NP  traZODone  (DESYREL ) 50 MG tablet Take 0.5-1 tablets (25-50 mg total) by mouth at bedtime as needed for sleep. 12/24/23  Yes Lorren, Amy J, NP  VENTOLIN  HFA 108 (90 Base) MCG/ACT inhaler Inhale 2 puffs into the lungs See admin instructions. Every 4-6 hours prn 12/24/23  Yes Lorren, Amy J, NP  acetaminophen  (TYLENOL ) 500 MG tablet Take 1 tablet (500 mg total) by mouth every 6 (six) hours as needed for mild pain (pain score 1-3) or moderate pain (pain score 4-6). Patient not taking: Reported on 12/29/2023 12/22/23   Singh, Prashant K, MD  ibuprofen  (ADVIL ) 600 MG tablet Take 1 tablet (600 mg total) by mouth every 8 (eight) hours as needed for moderate pain (pain score 4-6). 12/22/23   Singh, Prashant K, MD    Allergies: Patient has no known allergies.     Updated Vital Signs BP 123/86 (BP Location: Right Arm)   Pulse 76   Temp 97.6 F (36.4 C) (Oral)   Resp 20   SpO2 98%   Physical Exam Vitals reviewed.  Constitutional:      Appearance: He is not ill-appearing or diaphoretic.  HENT:     Head: Normocephalic and atraumatic.     Nose: Nose normal. No rhinorrhea.     Mouth/Throat:     Mouth: Mucous membranes are moist.     Pharynx: Oropharynx is clear.  Eyes:     Extraocular Movements: Extraocular movements intact.  Conjunctiva/sclera: Conjunctivae normal.  Pulmonary:     Effort: Respiratory distress (mild) present. No accessory muscle usage or retractions.     Breath sounds: Examination of the right-middle field reveals decreased breath sounds. Examination of the right-lower field reveals decreased breath sounds. Decreased breath sounds present. No wheezing or rhonchi.  Chest:     Chest wall: No tenderness or crepitus.  Abdominal:     General: Abdomen is flat.     Palpations: Abdomen is soft.     Tenderness: There is no abdominal tenderness. There is no guarding.  Musculoskeletal:        General: Normal range of motion.     Cervical back: Normal range of motion and neck  supple. No tenderness.     Right lower leg: No edema.     Left lower leg: No edema.  Skin:    General: Skin is warm and dry.     Coloration: Skin is not pale.  Neurological:     Mental Status: He is alert and oriented to person, place, and time.     (all labs ordered are listed, but only abnormal results are displayed) Labs Reviewed  BASIC METABOLIC PANEL WITH GFR - Abnormal; Notable for the following components:      Result Value   Glucose, Bld 120 (*)    BUN 24 (*)    Anion gap 3 (*)    All other components within normal limits  CBC - Abnormal; Notable for the following components:   WBC 12.0 (*)    All other components within normal limits  BASIC METABOLIC PANEL WITH GFR - Abnormal; Notable for the following components:   Sodium 134 (*)    Glucose, Bld 102 (*)    Calcium 8.5 (*)    All other components within normal limits  CBC  TROPONIN I (HIGH SENSITIVITY)  TROPONIN I (HIGH SENSITIVITY)    EKG: EKG Interpretation Date/Time:  Saturday December 29 2023 16:56:02 EDT Ventricular Rate:  120 PR Interval:  128 QRS Duration:  82 QT Interval:  322 QTC Calculation: 455 R Axis:   199  Text Interpretation: Normal sinus rhythm Right superior axis deviation Pulmonary disease pattern Abnormal ECG When compared with ECG of 04-Jan-2022 14:01, PREVIOUS ECG IS PRESENT poor baseline, no obvious STEMI, difficult to compare to previous Confirmed by Patt Alm DEL [phone]) on 12/29/2023 5:01:06 PM  Radiology: [facility] CHEST PORT 1 VIEW Result Date: 12/30/2023 EXAM: 1 VIEW XRAY OF THE CHEST 12/30/2023 09:49:58 AM COMPARISON: 1 view chest x-ray 12/29/2023. CLINICAL HISTORY: Pneumothorax. FINDINGS: LUNGS AND PLEURA: A small apical pneumothorax is present. Right basilar airspace opacity and effusion is noted. The left lung is clear. HEART AND MEDIASTINUM: No acute abnormality of the cardiac and mediastinal silhouettes. BONES AND SOFT TISSUES: No acute osseous abnormality. LINES AND TUBES: The pigtail  catheter is stable in position. IMPRESSION: 1. Small apical pneumothorax. 2. Right basilar airspace opacity and effusion. 3. Stable pigtail catheter position. Electronically signed by: Lonni Necessary MD 12/30/2023 10:31 AM EDT RP Workstation: [id number]   DG Chest Portable 1 View Result Date: 12/29/2023 CLINICAL DATA:  Chest tube placement.  Right-sided pneumothorax. EXAM: PORTABLE CHEST 1 VIEW COMPARISON:  12/29/2023 at 5:54 p.m. FINDINGS: Pigtail right pleural drainage catheter placed with substantial re-expansion of the right lung. Suspected moderate residual right apical pneumothorax at about 20% of right hemithoracic volume. Previous tension component has resolved. Bandlike atelectasis at the right lung base. Left lung remains clear. Cardiac and mediastinal margins appear normal. IMPRESSION: 1.  Pigtail right pleural drainage catheter placed with substantial re-expansion of the right lung. Suspected moderate residual right apical pneumothorax at about 20% of right hemithoracic volume. Previous tension component has resolved. 2. Bandlike atelectasis at the right lung base. Electronically Signed   By: Ryan Salvage M.D.   On: 12/29/2023 18:46   DG Chest Port 1 View Result Date: 12/29/2023 CLINICAL DATA:  Shortness of breath, recent pneumothorax EXAM: PORTABLE CHEST 1 VIEW COMPARISON:  12/22/2023 FINDINGS: Large right pneumothorax with near complete collapse of the right lung. There are some signs of tension physiology with leftward mediastinal shift. The left lung is clear. Stable heart size. No displaced rib fracture. IMPRESSION: Large right pneumothorax with signs of tension physiology. Critical Value/emergent results were called by telephone at the time of interpretation on 12/29/2023 at 6:17 pm to provider DAVID YAO , who verbally acknowledged these results. Electronically Signed   By: Norman Gatlin M.D.   On: 12/29/2023 18:24     CHEST TUBE INSERTION  Date/Time: 12/29/2023 6:51  PM  Performed by: Raoul Rake, MD Authorized by: Patt Alm Macho, MD   Consent:    Consent obtained:  Written   Consent given by:  Patient   Risks, benefits, and alternatives were discussed: yes     Risks discussed:  Bleeding, damage to surrounding structures, infection and pain   Alternatives discussed:  No treatment Pre-procedure details:    Skin preparation:  Povidone-iodine Sedation:    Sedation type:  Anxiolysis Anesthesia:    Anesthesia method:  Local infiltration   Local anesthetic:  Lidocaine  1% WITH epi Procedure details:    Placement location:  R lateral   Scalpel size:  11   Tube size (Fr):  8 (pigtail)   Ultrasound guidance: no     Tube connected to:  Suction   Drainage characteristics:  Clear (trace clear drainage, otherwise mostly air)   Suture material:  2-0 silk   Dressing: tegaderm dressing. Post-procedure details:    Post-insertion x-ray findings: tube in good position     Procedure completion:  Tolerated well, no immediate complications    Medications Ordered in the ED  acetaminophen  (TYLENOL ) tablet 650 mg (650 mg Oral Given 12/30/23 0909)    Or  acetaminophen  (TYLENOL ) suppository 650 mg ( Rectal See Alternative 12/30/23 0909)  ondansetron  (ZOFRAN ) tablet 4 mg (has no administration in time range)    Or  ondansetron  (ZOFRAN ) injection 4 mg (has no administration in time range)  lactated ringers  infusion ( Intravenous Rate/Dose Change 12/30/23 0945)  traZODone  (DESYREL ) tablet 25 mg (25 mg Oral Given 12/29/23 2235)  HYDROmorphone  (DILAUDID ) injection 1 mg (1 mg Intravenous Given 12/30/23 0742)  oxyCODONE  (Oxy IR/ROXICODONE ) immediate release tablet 10 mg (10 mg Oral Given 12/30/23 0909)  LORazepam  (ATIVAN ) injection 1 mg (1 mg Intravenous Given 12/30/23 0943)  ipratropium-albuterol  (DUONEB) 0.5-2.5 (3) MG/3ML nebulizer solution 3 mL (has no administration in time range)  diazepam  (VALIUM ) injection 2.5 mg (2.5 mg Intravenous Given 12/29/23 1810)   lidocaine  (XYLOCAINE ) 2 % (with pres) injection 200 mg (200 mg Infiltration Given by Other 12/29/23 1812)  sodium chloride  0.9 % bolus 1,000 mL (1,000 mLs Intravenous New Bag/Given 12/29/23 1811)  fentaNYL  (SUBLIMAZE ) injection 50 mcg (50 mcg Intravenous Given 12/29/23 1811)  diazepam  (VALIUM ) injection 2.5 mg (2.5 mg Intravenous Given 12/29/23 1812)  diazepam  (VALIUM ) injection 5 mg (5 mg Intravenous Given 12/29/23 1822)  fentaNYL  (SUBLIMAZE ) injection 50 mcg (50 mcg Intravenous Given 12/29/23 1842)  sodium chloride  0.9 % bolus 1,000  mL (1,000 mLs Intravenous New Bag/Given 12/29/23 2054)  HYDROmorphone  (DILAUDID ) injection 1 mg (1 mg Intravenous Given 12/29/23 2054)  HYDROmorphone  (DILAUDID ) injection 0.5 mg (0.5 mg Intravenous Given 12/29/23 2235)  HYDROmorphone  (DILAUDID ) injection 0.5 mg (0.5 mg Intravenous Given 12/30/23 0343)    Medical Decision Making Patient with the above history including recent spontaneous R pneumothorax ~1 month ago who presents with sudden onset SOB while sitting down earlier today. Physical exam significant for mild respiratory distress, desats to 90-92% on RA, and globally diminished/near absent R-sided lung sounds. Presentation most concerning for recurrent R pneumothorax, will get stat CXR. Placed patient on 2L [place] for mild hypoxia iso probable PTX.  Initially normotensive but while waiting for CXR, patient became hypotensive to 80-90/50s, and given his chest discomfort that has progressed since his SOB has worsened, increased concern for tension pneumothorax. He was started on IV fluid bolus. Also will check EKG, trops x2, and basic labs.   As above, patient's CXR was notable for large R pneumothorax with tension physiology. Patient was given valium  and fentanyl  and was promptly prepped for right chest tube insertion. Post-chest tube XR showed substantial re-expansion of the R lung with trace right apical PTX and resolved tension findings. Patient's BP stabilized s/p fluids  and chest tube insertion. Patient was given additional dose of analgesia for pain after chest tube insertion. CT surgery was consulted for management of chest tube and patient was then admitted to hospitalist team for further inpatient evaluation and management.  Amount and/or Complexity of Data Reviewed Labs: ordered. Radiology: ordered.  Risk Prescription drug management. Decision regarding hospitalization.    Final diagnoses:  Recurrent spontaneous pneumothorax       Raoul Rake, MD 12/30/23 1216    Patt Alm Macho, MD 12/30/23 1537    Patt Alm Macho, MD 01/29/24 2300

## 2023-12-29 NOTE — H&P (Signed)
 History and Physical    Patient: Albert Hill FMW:[id number] DOB: 03-13-1976 DOA: 12/29/2023 DOS: the patient was seen and examined on 12/29/2023 PCP: Lorren Greig PARAS, NP  Patient coming from: Home  Chief Complaint:  Chief Complaint  Patient presents with   Shortness of Breath   HPI: Albert Hill is a 48 y.o. male with medical history significant for Ptx 3 weeks ago, htn, and tobacco abuse presents with sudden onset sob at 12:30. He was sitting talking to someone when he felt it.  He left and went to his car to get his inhaler.  It wasn't there and his sob was not getting better so he drove himself to the ED.  His follow up appt with CT surgery is scheduled in another week.  The patient was breathing fine and in his usual state of health prior to 1230 today.  He quit smoking after his last hospitalization.  He says he has smoked 2 cigarettes total in the last 3 weeks but he did smoke one half of a cigarette yesterday. In the emergency department the patient was found to have a pneumothorax on the right with some tension features.  Chest tube was placed by the ED resident.  CT surgery was called and the patient will be admitted to the hospitalist service with CT surgery consulting.  Review of Systems: As mentioned in the history of present illness. All other systems reviewed and are negative. Past Medical History:  Diagnosis Date   Asthma    COPD (chronic obstructive pulmonary disease) (HCC)    GERD (gastroesophageal reflux disease)    Gout    Hypertension    Sleep apnea    Umbilical hernia without obstruction and without gangrene    Past Surgical History:  Procedure Laterality Date   CYST EXCISION     Patient states he was 48 years old, on his back   LUMBAR LAMINECTOMY/DECOMPRESSION MICRODISCECTOMY Left 09/16/2021   Procedure: LEFT L5-S1 MICRODISCECTOMY;  Surgeon: Clois Fret, MD;  Location: [facility] ORS;  Service: Neurosurgery;  Laterality: Left;   UMBILICAL HERNIA  REPAIR N/A 07/26/2018   Procedure: OPEN HERNIA REPAIR UMBILICAL ADULT WITH MESH;  Surgeon: Nicholaus Selinda Birmingham, MD;  Location: [facility] ORS;  Service: General;  Laterality: N/A;   XI ROBOTIC ASSISTED INGUINAL HERNIA REPAIR WITH MESH Bilateral 01/31/2022   Procedure: XI ROBOTIC ASSISTED INGUINAL HERNIA REPAIR WITH MESH;  Surgeon: Desiderio Schanz, MD;  Location: [facility] ORS;  Service: General;  Laterality: Bilateral;   Social History:  reports that he has been smoking cigarettes. He has a 60 pack-year smoking history. He has been exposed to tobacco smoke. His smokeless tobacco use includes chew. He reports that he does not currently use alcohol. He reports current drug use. Drug: Marijuana.  No Known Allergies  Family History  Problem Relation Age of Onset   Lung cancer Father    Asthma Son    Colon cancer Neg Hx    Esophageal cancer Neg Hx    Rectal cancer Neg Hx    Stomach cancer Neg Hx     Prior to Admission medications   Medication Sig Start Date End Date Taking? Authorizing Provider  acetaminophen  (TYLENOL ) 500 MG tablet Take 1 tablet (500 mg total) by mouth every 6 (six) hours as needed for mild pain (pain score 1-3) or moderate pain (pain score 4-6). 12/22/23   Dennise Lavada POUR, MD  albuterol  (PROVENTIL ) (2.5 MG/3ML) 0.083% nebulizer solution Take 2.5 mg by nebulization every 6 (six) hours as needed  for shortness of breath or wheezing.    [provider]  allopurinol  (ZYLOPRIM ) 300 MG tablet Take 1 tablet (300 mg total) by mouth daily. 12/24/23   Lorren Greig PARAS, NP  gabapentin  (NEURONTIN ) 300 MG capsule Take 1 capsule (300 mg total) by mouth daily. 12/24/23   Lorren Greig PARAS, NP  ibuprofen  (ADVIL ) 600 MG tablet Take 1 tablet (600 mg total) by mouth every 8 (eight) hours as needed for moderate pain (pain score 4-6). 12/22/23   Dennise Lavada POUR, MD  lisinopril  (ZESTRIL ) 20 MG tablet Take 1 tablet (20 mg total) by mouth daily. 12/24/23   Lorren Greig PARAS, NP  omeprazole  (PRILOSEC) 20 MG capsule  Take 1 capsule (20 mg total) by mouth daily. 12/24/23 03/23/24  Lorren Greig PARAS, NP  sildenafil  (VIAGRA ) 100 MG tablet Take 1 tablet 1/2 hour to 1 hour prior to intercourse as needed. Limit use to 1/2 tablet or 1 tablet per 24 hours. 12/24/23   Lorren Greig PARAS, NP  traZODone  (DESYREL ) 50 MG tablet Take 0.5-1 tablets (25-50 mg total) by mouth at bedtime as needed for sleep. 12/24/23   Lorren Greig PARAS, NP  VENTOLIN  HFA 108 (90 Base) MCG/ACT inhaler Inhale 2 puffs into the lungs See admin instructions. Every 4-6 hours prn 12/24/23   Lorren Greig PARAS, NP    Physical Exam: Vitals:   12/29/23 1828 12/29/23 1830 12/29/23 1845 12/29/23 1900  BP: 100/61 97/62 98/63  92/60  Pulse: 96 96 95 95  Resp: (!) 21 17 18 15   Temp:      SpO2: 95% 93% 94% 97%   Physical Exam:  General: writhing in pain, well developed, well nourished HEENT: Normocephalic, atraumatic, PERRL Cardiovascular: Normal rate and rhythm. Distal pulses intact. Pulmonary: Normal pulmonary effort, chest tube in right upper side Gastrointestinal: Nondistended abdomen, soft, non-tender, normoactive bowel sounds Musculoskeletal:Normal ROM, no lower ext edema Lymphadenopathy: No cervical LAD. Skin: Skin is warm and dry. Neuro: No focal deficits noted, AAOx3. PSYCH: Attentive and cooperative  Data Reviewed:  Results for orders placed or performed during the hospital encounter of 12/29/23 (from the past 24 hours)  Basic metabolic panel     Status: Abnormal   Collection Time: 12/29/23  4:59 PM  Result Value Ref Range   Sodium 137 135 - 145 mmol/L   Potassium 4.2 3.5 - 5.1 mmol/L   Chloride 104 98 - 111 mmol/L   CO2 30 22 - 32 mmol/L   Glucose, Bld 120 (H) 70 - 99 mg/dL   BUN 24 (H) 6 - 20 mg/dL   Creatinine, Ser 9.05 0.61 - 1.24 mg/dL   Calcium 9.1 8.9 - 89.6 mg/dL   GFR, Estimated >39 >39 mL/min   Anion gap 3 (L) 5 - 15  CBC     Status: Abnormal   Collection Time: 12/29/23  4:59 PM  Result Value Ref Range   WBC 12.0 (H) 4.0 - 10.5 K/uL    RBC 4.45 4.22 - 5.81 MIL/uL   Hemoglobin 13.8 13.0 - 17.0 g/dL   HCT 59.6 60.9 - 47.9 %   MCV 90.6 80.0 - 100.0 fL   MCH 31.0 26.0 - 34.0 pg   MCHC 34.2 30.0 - 36.0 g/dL   RDW 87.1 88.4 - 84.4 %   Platelets 335 150 - 400 K/uL   nRBC 0.0 0.0 - 0.2 %  Troponin I (High Sensitivity)     Status: None   Collection Time: 12/29/23  4:59 PM  Result Value Ref Range   Troponin  I (High Sensitivity) 8 <18 ng/L     Assessment and Plan: Acute pneumothorax, recurrent - Chest tube in place - Pain control - CT surgery to manage and see in a.m.  2. COPD without exacerbation - scheduled nebs At baseline the patient uses only albuterol  as needed.  3.  Hypertension -the patient is on Zestril  at baseline but he is slightly hypotensive at this time so his medication will be held.  4. Tobacco abuse - He is in the process of quiting. Continued counseling.    Advance Care Planning:   Code Status: Prior  The patient names his mother as his surrogate decision maker and he wants to be full code.  Consults: CT surgery  Family Communication: none  Severity of Illness: The appropriate patient status for this patient is INPATIENT. Inpatient status is judged to be reasonable and necessary in order to provide the required intensity of service to ensure the patient's safety. The patient's presenting symptoms, physical exam findings, and initial radiographic and laboratory data in the context of their chronic comorbidities is felt to place them at high risk for further clinical deterioration. Furthermore, it is not anticipated that the patient will be medically stable for discharge from the hospital within 2 midnights of admission.   * I certify that at the point of admission it is my clinical judgment that the patient will require inpatient hospital care spanning beyond 2 midnights from the point of admission due to high intensity of service, high risk for further deterioration and high frequency of  surveillance required.*  Author: ARTHEA CHILD, MD 12/29/2023 7:22 PM  For on call review [email or web address].

## 2023-12-29 NOTE — ED Triage Notes (Signed)
 Pt here from home with /c o sob that started about 12 pm today , pt was just recently in the hospital for treatment of right side pneumo

## 2023-12-30 ENCOUNTER — Inpatient Hospital Stay (HOSPITAL_COMMUNITY)

## 2023-12-30 DIAGNOSIS — F1210 Cannabis abuse, uncomplicated: Secondary | ICD-10-CM

## 2023-12-30 DIAGNOSIS — K219 Gastro-esophageal reflux disease without esophagitis: Secondary | ICD-10-CM | POA: Diagnosis not present

## 2023-12-30 DIAGNOSIS — Z720 Tobacco use: Secondary | ICD-10-CM

## 2023-12-30 DIAGNOSIS — J9383 Other pneumothorax: Secondary | ICD-10-CM | POA: Diagnosis not present

## 2023-12-30 DIAGNOSIS — J9311 Primary spontaneous pneumothorax: Secondary | ICD-10-CM | POA: Diagnosis not present

## 2023-12-30 DIAGNOSIS — M109 Gout, unspecified: Secondary | ICD-10-CM | POA: Diagnosis not present

## 2023-12-30 DIAGNOSIS — J930 Spontaneous tension pneumothorax: Secondary | ICD-10-CM | POA: Diagnosis not present

## 2023-12-30 DIAGNOSIS — I10 Essential (primary) hypertension: Secondary | ICD-10-CM | POA: Diagnosis not present

## 2023-12-30 DIAGNOSIS — J449 Chronic obstructive pulmonary disease, unspecified: Secondary | ICD-10-CM | POA: Diagnosis not present

## 2023-12-30 LAB — PROTIME-INR
INR: 1 (ref 0.8–1.2)
Prothrombin Time: 14.2 s (ref 11.4–15.2)

## 2023-12-30 LAB — CBC
HCT: 39.2 % (ref 39.0–52.0)
HCT: 34.6 % — ABNORMAL LOW (ref 39.0–52.0)
Hemoglobin: 13.3 g/dL (ref 13.0–17.0)
Hemoglobin: 11.7 g/dL — ABNORMAL LOW (ref 13.0–17.0)
MCH: 31.4 pg (ref 26.0–34.0)
MCH: 30.6 pg (ref 26.0–34.0)
MCHC: 33.9 g/dL (ref 30.0–36.0)
MCHC: 33.8 g/dL (ref 30.0–36.0)
MCV: 92.5 fL (ref 80.0–100.0)
MCV: 90.6 fL (ref 80.0–100.0)
Platelets: 256 K/uL (ref 150–400)
Platelets: 223 K/uL (ref 150–400)
RBC: 4.24 MIL/uL (ref 4.22–5.81)
RBC: 3.82 MIL/uL — ABNORMAL LOW (ref 4.22–5.81)
RDW: 13 % (ref 11.5–15.5)
RDW: 12.8 % (ref 11.5–15.5)
WBC: 9.6 K/uL (ref 4.0–10.5)
WBC: 10.2 K/uL (ref 4.0–10.5)
nRBC: 0 % (ref 0.0–0.2)
nRBC: 0 % (ref 0.0–0.2)

## 2023-12-30 LAB — COMPREHENSIVE METABOLIC PANEL WITH GFR
ALT: 12 U/L (ref 0–44)
AST: 12 U/L — ABNORMAL LOW (ref 15–41)
Albumin: 2.9 g/dL — ABNORMAL LOW (ref 3.5–5.0)
Alkaline Phosphatase: 75 U/L (ref 38–126)
Anion gap: 6 (ref 5–15)
BUN: 15 mg/dL (ref 6–20)
CO2: 27 mmol/L (ref 22–32)
Calcium: 8.5 mg/dL — ABNORMAL LOW (ref 8.9–10.3)
Chloride: 104 mmol/L (ref 98–111)
Creatinine, Ser: 0.76 mg/dL (ref 0.61–1.24)
GFR, Estimated: >60 mL/min (ref >60)
Glucose, Bld: 97 mg/dL (ref 70–99)
Potassium: 4.4 mmol/L (ref 3.5–5.1)
Sodium: 137 mmol/L (ref 135–145)
Total Bilirubin: 0.5 mg/dL (ref 0.0–1.2)
Total Protein: 5.7 g/dL — ABNORMAL LOW (ref 6.5–8.1)

## 2023-12-30 LAB — TYPE AND SCREEN
ABO/RH(D): O POS
Antibody Screen: NEGATIVE

## 2023-12-30 LAB — BASIC METABOLIC PANEL WITH GFR
Anion gap: 6 (ref 5–15)
BUN: 14 mg/dL (ref 6–20)
CO2: 26 mmol/L (ref 22–32)
Calcium: 8.5 mg/dL — ABNORMAL LOW (ref 8.9–10.3)
Chloride: 102 mmol/L (ref 98–111)
Creatinine, Ser: 0.72 mg/dL (ref 0.61–1.24)
GFR, Estimated: >60 mL/min (ref >60)
Glucose, Bld: 102 mg/dL — ABNORMAL HIGH (ref 70–99)
Potassium: 4.1 mmol/L (ref 3.5–5.1)
Sodium: 134 mmol/L — ABNORMAL LOW (ref 135–145)

## 2023-12-30 LAB — APTT: aPTT: 33 s (ref 24–36)

## 2023-12-30 MED ORDER — KETOROLAC TROMETHAMINE 15 MG/ML IJ SOLN
15.0000 mg | Freq: Four times a day (QID) | INTRAMUSCULAR | Status: DC
  Administered 2023-12-30 – 2023-12-31 (×3): 15 mg via INTRAVENOUS
  Filled 2023-12-30 (×3): qty 1

## 2023-12-30 MED ORDER — SENNOSIDES-DOCUSATE SODIUM 8.6-50 MG PO TABS
1.0000 | ORAL_TABLET | Freq: Two times a day (BID) | ORAL | Status: DC
  Administered 2023-12-30 – 2024-01-15 (×22): 1 via ORAL
  Filled 2023-12-30 (×27): qty 1

## 2023-12-30 MED ORDER — POLYETHYLENE GLYCOL 3350 17 G PO PACK
17.0000 g | PACK | Freq: Every day | ORAL | Status: DC
  Administered 2023-12-30 – 2024-01-12 (×5): 17 g via ORAL
  Filled 2023-12-30 (×9): qty 1

## 2023-12-30 MED ORDER — HYDROMORPHONE HCL 1 MG/ML IJ SOLN
0.5000 mg | Freq: Once | INTRAMUSCULAR | Status: AC
  Administered 2023-12-30: 0.5 mg via INTRAVENOUS
  Filled 2023-12-30: qty 0.5

## 2023-12-30 MED ORDER — OXYCODONE HCL 5 MG PO TABS
10.0000 mg | ORAL_TABLET | ORAL | Status: DC | PRN
  Administered 2023-12-30 – 2024-01-08 (×18): 10 mg via ORAL
  Filled 2023-12-30 (×2): qty 2
  Filled 2023-12-30: qty 1
  Filled 2023-12-30 (×17): qty 2

## 2023-12-30 MED ORDER — ACETAMINOPHEN 500 MG PO TABS
1000.0000 mg | ORAL_TABLET | Freq: Four times a day (QID) | ORAL | Status: DC
  Administered 2023-12-30 (×2): 1000 mg via ORAL
  Filled 2023-12-30 (×2): qty 2

## 2023-12-30 MED ORDER — HYDROMORPHONE HCL 1 MG/ML IJ SOLN
1.0000 mg | INTRAMUSCULAR | Status: DC | PRN
  Administered 2023-12-30 (×3): 1 mg via INTRAVENOUS
  Filled 2023-12-30 (×3): qty 1

## 2023-12-30 MED ORDER — CEFAZOLIN SODIUM-DEXTROSE 2-4 GM/100ML-% IV SOLN
2.0000 g | INTRAVENOUS | Status: AC
  Administered 2023-12-31: 2 g via INTRAVENOUS
  Filled 2023-12-30: qty 100

## 2023-12-30 MED ORDER — LORAZEPAM 2 MG/ML IJ SOLN
1.0000 mg | Freq: Four times a day (QID) | INTRAMUSCULAR | Status: DC | PRN
  Administered 2023-12-30 (×3): 1 mg via INTRAVENOUS
  Filled 2023-12-30 (×3): qty 1

## 2023-12-30 MED ORDER — OXYCODONE HCL 5 MG PO TABS
5.0000 mg | ORAL_TABLET | ORAL | Status: DC | PRN
  Administered 2023-12-30: 5 mg via ORAL
  Filled 2023-12-30: qty 1

## 2023-12-30 MED ORDER — HYDROMORPHONE HCL 1 MG/ML IJ SOLN
1.0000 mg | INTRAMUSCULAR | Status: DC | PRN
  Administered 2023-12-30 – 2024-01-03 (×14): 1 mg via INTRAVENOUS
  Filled 2023-12-30 (×16): qty 1

## 2023-12-30 MED ORDER — OXYCODONE HCL 5 MG PO TABS
10.0000 mg | ORAL_TABLET | Freq: Four times a day (QID) | ORAL | Status: DC | PRN
  Administered 2023-12-30 (×2): 10 mg via ORAL
  Filled 2023-12-30 (×2): qty 2

## 2023-12-30 MED ORDER — IPRATROPIUM-ALBUTEROL 0.5-2.5 (3) MG/3ML IN SOLN
3.0000 mL | Freq: Four times a day (QID) | RESPIRATORY_TRACT | Status: DC | PRN
  Administered 2023-12-30: 3 mL via RESPIRATORY_TRACT
  Filled 2023-12-30: qty 3

## 2023-12-30 NOTE — Progress Notes (Addendum)
 Pt anxious and in hunched over in 10/10 pain.  Oxy 10mg  and tylenol  given. MD Amrit notified and assessed pt at bedside. KUB ordered Prn Ativan  1mg  ordered and administered. Patient appears more calm & reports pain is manageable.

## 2023-12-30 NOTE — Plan of Care (Signed)

## 2023-12-30 NOTE — Consult Note (Addendum)
 [address]       [place] [address]             [phone]        [name] [facility] Medical Record #[id number] Date of Birth: 11-04-1975  Referring:  Jillian Buttery, MD Primary Care: Lorren Greig PARAS, NP Primary Cardiologist:None  Reason for consult: Evaluation for potential surgical management of recurrent right pneumothorax.   History of Present Illness:     [name] is a 48 year old male who was referred to us  about 2 weeks ago for first occurrence of right spontaneous pneumothorax. He has a past medical history notable for longstanding (60-pack-year) tobacco use and marijuana use.  He has hypertension managed with lisinopril  and also has a history of obstructive sleep apnea but has never followed through with obtaining a CPAP apparatus.  He was admitted on 12/19/2023 for spontaneous right tension pneumothorax that was managed with an 8.5 French chest tube.  We were consulted after he was transferred from [facility] to [facility].  The patient on the was seen by Dr. Lucas for chest tube management.  After the airleak resolved, chest tube was removed and a follow-up chest x-ray showed no recurrent pneumothorax.  He was discharged home.  Albert Hill presented to [facility] emergency room last evening after developing sudden onset shortness of breath around noon yesterday.  In the emergency room, he was found to have a recurrent right facial pneumothorax.  Chest tube was placed in the emergency room resulting in full expansion of the right lung.  We have again been asked to see Albert Hill for chest tube management and for consideration of surgical intervention for his recurrent pneumothorax.  He continues to smoke but is trying to quit.  His dad died from lung cancer.  Currently, Albert Hill is sitting on side of the bed.  His oxygen  tubing was on the floor beside the bed.  Oxygen  saturation is 87% on room air.  The O2  sat improved to 95% after his oxygen  was replaced.  He complains of pain at the chest tube insertion site.     Current Activity/ Functional Status: Patient is independent with mobility/ambulation, transfers, ADL's, IADL's.   Zubrod Score: At the time of surgery this patient's most appropriate activity status/level should be described as: []     0    Normal activity, no symptoms []     1    Restricted in physical strenuous activity but ambulatory, able to do out light work []     2    Ambulatory and capable of self care, unable to do work activities, up and about                 more than 50%  Of the time                            []     3    Only limited self care, in bed greater than 50% of waking hours []     4    Completely disabled, no self care, confined to bed or chair []     5    Moribund  Past Medical History:  Diagnosis Date   Asthma    COPD (chronic obstructive pulmonary disease) (HCC)    GERD (gastroesophageal reflux disease)    Gout    Hypertension    Sleep apnea  Umbilical hernia without obstruction and without gangrene     Past Surgical History:  Procedure Laterality Date   CYST EXCISION     Patient states he was 48 years old, on his back   LUMBAR LAMINECTOMY/DECOMPRESSION MICRODISCECTOMY Left 09/16/2021   Procedure: LEFT L5-S1 MICRODISCECTOMY;  Surgeon: Clois Fret, MD;  Location: [facility] ORS;  Service: Neurosurgery;  Laterality: Left;   UMBILICAL HERNIA REPAIR N/A 07/26/2018   Procedure: OPEN HERNIA REPAIR UMBILICAL ADULT WITH MESH;  Surgeon: Nicholaus Selinda Birmingham, MD;  Location: [facility] ORS;  Service: General;  Laterality: N/A;   XI ROBOTIC ASSISTED INGUINAL HERNIA REPAIR WITH MESH Bilateral 01/31/2022   Procedure: XI ROBOTIC ASSISTED INGUINAL HERNIA REPAIR WITH MESH;  Surgeon: Desiderio Schanz, MD;  Location: [facility] ORS;  Service: General;  Laterality: Bilateral;    Social History   Tobacco Use  Smoking Status Every Day   Current packs/day: 2.00   Average packs/day: 2.0  packs/day for 30.0 years (60.0 ttl pk-yrs)   Types: Cigarettes   Passive exposure: Current  Smokeless Tobacco Current   Types: Chew  Tobacco Comments   Smokes a ppd.  Wants to quit.  08/10/2021.    Social History   Substance and Sexual Activity  Alcohol Use Not Currently   Comment: drank age [age], quit     No Known Allergies  Current Facility-Administered Medications  Medication Dose Route Frequency Provider Last Rate Last Admin   acetaminophen  (TYLENOL ) tablet 650 mg  650 mg Oral Q6H PRN Arthea Child, MD   650 mg at 12/30/23 9090   Or   acetaminophen  (TYLENOL ) suppository 650 mg  650 mg Rectal Q6H PRN Arthea Child, MD       HYDROmorphone  (DILAUDID ) injection 1 mg  1 mg Intravenous Q4H PRN Adhikari, Amrit, MD   1 mg at 12/30/23 9257   lactated ringers  infusion   Intravenous Continuous Jillian Buttery, MD 50 mL/hr at 12/30/23 0945 Rate Change at 12/30/23 0945   LORazepam  (ATIVAN ) injection 1 mg  1 mg Intravenous Q6H PRN Adhikari, Amrit, MD   1 mg at 12/30/23 0943   ondansetron  (ZOFRAN ) tablet 4 mg  4 mg Oral Q6H PRN Arthea Child, MD       Or   ondansetron  (ZOFRAN ) injection 4 mg  4 mg Intravenous Q6H PRN Arthea Child, MD       oxyCODONE  (Oxy IR/ROXICODONE ) immediate release tablet 10 mg  10 mg Oral Q6H PRN Jillian Buttery, MD   10 mg at 12/30/23 9090   traZODone  (DESYREL ) tablet 25 mg  25 mg Oral QHS PRN Chavez, Abigail, NP   25 mg at 12/29/23 2235    Medications Prior to Admission  Medication Sig Dispense Refill Last Dose/Taking   albuterol  (PROVENTIL ) (2.5 MG/3ML) 0.083% nebulizer solution Take 2.5 mg by nebulization every 6 (six) hours as needed for shortness of breath or wheezing.   12/29/2023 Evening   allopurinol  (ZYLOPRIM ) 300 MG tablet Take 1 tablet (300 mg total) by mouth daily. 90 tablet 0 12/29/2023 Morning   gabapentin  (NEURONTIN ) 300 MG capsule Take 1 capsule (300 mg total) by mouth daily. 90 capsule 0 12/29/2023 Morning   lisinopril  (ZESTRIL ) 20  MG tablet Take 1 tablet (20 mg total) by mouth daily. 90 tablet 0 12/29/2023 Morning   omeprazole  (PRILOSEC) 20 MG capsule Take 1 capsule (20 mg total) by mouth daily. 90 capsule 0 12/29/2023 Morning   sildenafil  (VIAGRA ) 100 MG tablet Take 1 tablet 1/2 hour to 1 hour prior to intercourse as needed. Limit use to  1/2 tablet or 1 tablet per 24 hours. 90 tablet 0 Taking   traZODone  (DESYREL ) 50 MG tablet Take 0.5-1 tablets (25-50 mg total) by mouth at bedtime as needed for sleep. 90 tablet 0 12/28/2023 Bedtime   VENTOLIN  HFA 108 (90 Base) MCG/ACT inhaler Inhale 2 puffs into the lungs See admin instructions. Every 4-6 hours prn 18 g 2 12/29/2023 Evening   acetaminophen  (TYLENOL ) 500 MG tablet Take 1 tablet (500 mg total) by mouth every 6 (six) hours as needed for mild pain (pain score 1-3) or moderate pain (pain score 4-6). (Patient not taking: Reported on 12/29/2023) 20 tablet 0 Not Taking   ibuprofen  (ADVIL ) 600 MG tablet Take 1 tablet (600 mg total) by mouth every 8 (eight) hours as needed for moderate pain (pain score 4-6). 20 tablet 0     Family History  Problem Relation Age of Onset   Lung cancer Father    Asthma Son    Colon cancer Neg Hx    Esophageal cancer Neg Hx    Rectal cancer Neg Hx    Stomach cancer Neg Hx      Review of Systems:       Cardiac Review of Systems: Y or  [    ]= no  Chest Pain [yes, at chest tube insertion site ]   Pedal Edema [   ]    Palpitations [  ] Syncope  [  ]   Presyncope [   ]  General Review of Systems: [Y] = yes [  ]=no Constitional: recent weight change [  ]; anorexia [  ]; fatigue [  ]; nausea [  ]; night sweats [  ]; fever [  ]; or chills [  ]                                                                Eye : blurred vision [  ]; diplopia [   ]; vision changes [  ];  Amaurosis fugax[  ]; Resp: cough [ x ];  wheezing[ x ];  hemoptysis[  ]; shortness of breath[ x ]; paroxysmal nocturnal dyspnea[  ]; dyspnea on exertion[  ]; or orthopnea[  ];  GI:   gallstones[  ], vomiting[  ];  dysphagia[  ]; melena[  ];  hematochezia [  ]; heartburn[  ];   Hx of  Colonoscopy[  ]; GU: kidney stones [  ]; hematuria[  ];   dysuria [  ];  nocturia[  ];  history of     obstruction [  ]; urinary frequency [  ]             Skin: rash, swelling[  ];, hair loss[  ];  peripheral edema[  ];  or itching[  ]; Musculosketetal: myalgias[  ];  joint swelling[  ];  joint erythema[  ];  joint pain[  ];  back pain[  ];  Heme/Lymph: bruising[  ];  bleeding[  ];  anemia[  ];  Neuro: TIA[  ];  headaches[  ];  stroke[  ];  vertigo[  ];  seizures[  ];   paresthesias[  ];  difficulty walking[  ];  Psych:depression[  ]; anxiety[  ];  Endocrine: diabetes[  ];  thyroid dysfunction[  ];  Physical Exam: BP 123/86 (BP Location: Right Arm)   Pulse 76   Temp 97.6 F (36.4 C) (Oral)   Resp 20   SpO2 98%    General appearance: alert, cooperative, and moderate distress Head: Normocephalic, without obvious abnormality, atraumatic Neck: no adenopathy, no carotid bruit, no JVD, and supple, symmetrical, trachea midline Lymph nodes: No cervical or clavicular adenopathy Resp: Inspiratory and expiratory wheezes bilaterally.  The right pigtail catheter is secured the and stopcock is positioning appropriately.  There is no airleak and minimal drainage In the Pleur-evac. Cardio: Regular rate rhythm GI: Soft, no tenderness Extremities: Warm and well-perfused, no peripheral edema Neurologic: Sleepy after just having IV pain medication.  No gross focal deficits and mental status is otherwise appropriate.  Diagnostic Studies & Laboratory data:     Recent Radiology Findings:   EXAM: 1 VIEW XRAY OF THE CHEST 12/30/2023 09:49:58 AM   COMPARISON: 1 view chest x-ray 12/29/2023.   CLINICAL HISTORY: Pneumothorax.   FINDINGS:   LUNGS AND PLEURA: A small apical pneumothorax is present. Right basilar airspace opacity and effusion is noted. The left lung is clear.   HEART AND  MEDIASTINUM: No acute abnormality of the cardiac and mediastinal silhouettes.   BONES AND SOFT TISSUES: No acute osseous abnormality.   LINES AND TUBES: The pigtail catheter is stable in position.   IMPRESSION: 1. Small apical pneumothorax. 2. Right basilar airspace opacity and effusion. 3. Stable pigtail catheter position.   Electronically signed by: Lonni Necessary MD 12/30/2023 10:31 AM EDT RP Workstation: [id number]    DG Chest Portable 1 View Result Date: 12/29/2023 CLINICAL DATA:  Chest tube placement.  Right-sided pneumothorax. EXAM: PORTABLE CHEST 1 VIEW COMPARISON:  12/29/2023 at 5:54 p.m. FINDINGS: Pigtail right pleural drainage catheter placed with substantial re-expansion of the right lung. Suspected moderate residual right apical pneumothorax at about 20% of right hemithoracic volume. Previous tension component has resolved. Bandlike atelectasis at the right lung base. Left lung remains clear. Cardiac and mediastinal margins appear normal. IMPRESSION: 1. Pigtail right pleural drainage catheter placed with substantial re-expansion of the right lung. Suspected moderate residual right apical pneumothorax at about 20% of right hemithoracic volume. Previous tension component has resolved. 2. Bandlike atelectasis at the right lung base. Electronically Signed   By: Ryan Salvage M.D.   On: 12/29/2023 18:46   DG Chest Port 1 View Result Date: 12/29/2023 CLINICAL DATA:  Shortness of breath, recent pneumothorax EXAM: PORTABLE CHEST 1 VIEW COMPARISON:  12/22/2023 FINDINGS: Large right pneumothorax with near complete collapse of the right lung. There are some signs of tension physiology with leftward mediastinal shift. The left lung is clear. Stable heart size. No displaced rib fracture. IMPRESSION: Large right pneumothorax with signs of tension physiology. Critical Value/emergent results were called by telephone at the time of interpretation on 12/29/2023 at 6:17 pm to provider DAVID  YAO , who verbally acknowledged these results. Electronically Signed   By: Norman Gatlin M.D.   On: 12/29/2023 18:24     I have independently reviewed the above radiologic studies and discussed with the patient   Recent Lab Findings: Lab Results  Component Value Date   WBC 9.6 12/30/2023   HGB 13.3 12/30/2023   HCT 39.2 12/30/2023   PLT 256 12/30/2023   GLUCOSE 120 (H) 12/29/2023   CHOL 170 01/22/2019   TRIG 211 (H) 01/22/2019   HDL 35 (L) 01/22/2019   LDLCALC 93 01/22/2019   ALT 17 12/20/2023   AST 18 12/20/2023   NA  137 12/29/2023   K 4.2 12/29/2023   CL 104 12/29/2023   CREATININE 0.94 12/29/2023   BUN 24 (H) 12/29/2023   CO2 30 12/29/2023   TSH 1.40 07/18/2021   HGBA1C 5.4 06/06/2018      Assessment / Plan:      - 48 year old male with second occurrence of right spontaneous pneumothorax with tension physiology on both occasions.  On the previous admission 2 weeks ago he was treated with chest tube placement with subsequent resolution of the pneumothorax.  He had no significant airleak so the chest tube was removed and he was discharged home within a couple of days.  He was readmitted last evening with a recurrent tension pneumothorax.  A new pigtail catheter was placed in the emergency room with reexpansion of the right lung.  The chest tube is on waterseal and he currently has no airleak on exam.  He has bilateral wheezing and was hypoxic when I initially saw him but his O2 saturation improved from 87 to 95% when his nasal cannula oxygen  was replaced.  I explained that surgical intervention with possible bleb resection and pleural abrasion is now indicated since there is a high likelihood of recurrence of the pneumothorax.  Will need CT scanning of the chest to evaluate the anatomy.  Further discussion regarding timing of surgery will follow.  He would benefit from bronchodilator therapy for his wheezing and suspected COPD.  Will defer to the primary team.  The importance  of smoking cessation was again stressed to Albert Hill.   I  spent 20 minutes counseling the patient face to face.   Myron G. Roddenberry, PA-C  12/30/2023 10:12 AM  Albert Hill is a 48 year old man with a history of tobacco and marijuana abuse, COPD, reflux, obstructive sleep apnea, hypertension, and gout.  He was hospitalized 2 weeks ago with a right spontaneous pneumothorax.  Treated conservatively.  His airleak resolved and he was discharged home.  Presented back yesterday with acute chest pain and shortness of breath.  Found to have a large recurrent right pneumothorax.  Had a pigtail catheter placed with good reexpansion of the lung.  Surgical intervention is indicated in the setting of recurrent pneumothorax.  Recommend robotic assisted right VATS for bleb resection and pleural abrasion.  I discussed the general nature of the procedure, including the need for general anesthesia, the incisions to be used, and the use of a drainage tube postoperatively with Albert Hill.  We discussed the expected hospital stay, overall recovery and short and long term outcomes. I informed him of the indications, risks, benefits and alternatives.   He understands the risks include, but are not limited to death, stroke, MI, DVT/PE, bleeding, possible need for transfusion, infections, cardiac arrhythmias, persistent air leak, as well as possibility of other unforeseeable complications.  Discussed importance of smoking cessation.  Needs a CT chest without contrast for surgical planning.  Has been ordered but not yet completed.  Tentatively plan surgery by Dr. Shyrl tomorrow as second case.  He is having significant pain.  I increased the frequency of his oxycodone  and Dilaudid  as needed orders.  Made Tylenol  1000 mg every 6 hours standing order.  Added ketorolac  15 mg IV every 6 hours x 4 doses.  Dr. Shyrl to see tomorrow  Elspeth BROCKS. Kerrin, MD [facility] [phone]

## 2023-12-30 NOTE — Progress Notes (Signed)
 PROGRESS NOTE  Albert Hill  FMW:[id number] DOB: 1976/05/01 DOA: 12/29/2023 PCP: Lorren Greig PARAS, NP   Brief Narrative: Patient is a 48 year old male with history of pneumothorax about 3 weeks ago, hypertension, tobacco use.  He was recently admitted here and was discharged on 12/22/2023 after he was managed for right-sided pneumothorax.  Patient developed sudden onset of shortness of breath on 7/12 and presented himself to the emergency department.  On presentation, he was hemodynamically stable.  Chest imaging showed right-sided pneumothorax.  Chest tube placed by ED resident.  CT surgery contacted.  Planning for surgical intervention.  Assessment & Plan:  Principal Problem:   Pneumothorax    Recurrent right-sided pneumothorax: He was recently admitted here and was discharged on 12/22/2023 after he was managed for right-sided pneumothorax.  History of undiagnosed underlying COPD/emphysema.  He underwent chest tube placement on the right side and cardiothoracic surgery was following.  Chest tube discontinued on 7/5, repeat chest x-ray was stable and was discharged home.   Developed shortness of breath at home.  Chest imaging showed recurrent right-sided pneumothorax.  Chest tube placed in the emergency department.  CT surgery consulted. Complains of severe pain on the chest tube insertion site today.  Chest x-ray showed Small apical pneumothorax,right basilar airspace opacity and effusion  Acute hypoxic respiratory failure:Currently requiring 2 L.  Not on oxygen  at home.  Likely from underlying pneumothorax, right-sided effusion, underlying COPD  History of COPD/tobacco use: Continue nicotine  patch.  Bronchodilators as needed.Patient states he is motivated to quit smoking  History of hypertension: On Zestril  at home.  Patient was slightly hypotensive on presentation.  Currently blood pressure medications on hold.    GERD: Continue PPI        DVT prophylaxis:SCDs Start: 12/29/23  1944     Code Status: Full Code  Family Communication: None at  bedside  Patient status:Inpatient  Patient is from :Home  Anticipated discharge un:Ynfz  Estimated DC date:2-3 days,after CT surgery clearance   Consultants: CT surgery  Procedures:Chest tube placement  Antimicrobials:  Anti-infectives (From admission, onward)    None       Subjective: Patient seen and examined at bedside today.  Very uncomfortable.  Sitting at the edge of the bed.  Complains of severe pain in the chest tube insertion site.  Currently requiring 2 L of oxygen  per minute   Objective: Vitals:   12/29/23 1945 12/29/23 2000 12/29/23 2139 12/30/23 0510  BP: (!) 90/54 (!) 87/55 (!) 120/96 102/69  Pulse: 89 80 78 74  Resp: 18 14 16 16   Temp:   97.6 F (36.4 C) (!) 97.4 F (36.3 C)  TempSrc:   Oral Oral  SpO2: 97% 99% 93% 96%   No intake or output data in the 24 hours ending 12/30/23 0817 There were no vitals filed for this visit.  Examination:  General exam: In moderate distress due to chest pain HEENT: PERRL Respiratory system:  no wheezes or crackles, mild diminished sounds on the right side, right-sided chest tube Cardiovascular system: S1 & S2 heard, RRR.  Gastrointestinal system: Abdomen is nondistended, soft and nontender. Central nervous system: Alert and oriented Extremities: No edema, no clubbing ,no cyanosis Skin: No rashes, no ulcers,no icterus     Data Reviewed: I have personally reviewed following labs and imaging studies  CBC: Recent Labs  Lab 12/29/23 1659  WBC 12.0*  HGB 13.8  HCT 40.3  MCV 90.6  PLT 335   Basic Metabolic Panel: Recent Labs  Lab 12/29/23  1659  NA 137  K 4.2  CL 104  CO2 30  GLUCOSE 120*  BUN 24*  CREATININE 0.94  CALCIUM 9.1     No results found for this or any previous visit (from the past 240 hours).   Radiology Studies: DG Chest Portable 1 View Result Date: 12/29/2023 CLINICAL DATA:  Chest tube placement.  Right-sided  pneumothorax. EXAM: PORTABLE CHEST 1 VIEW COMPARISON:  12/29/2023 at 5:54 p.m. FINDINGS: Pigtail right pleural drainage catheter placed with substantial re-expansion of the right lung. Suspected moderate residual right apical pneumothorax at about 20% of right hemithoracic volume. Previous tension component has resolved. Bandlike atelectasis at the right lung base. Left lung remains clear. Cardiac and mediastinal margins appear normal. IMPRESSION: 1. Pigtail right pleural drainage catheter placed with substantial re-expansion of the right lung. Suspected moderate residual right apical pneumothorax at about 20% of right hemithoracic volume. Previous tension component has resolved. 2. Bandlike atelectasis at the right lung base. Electronically Signed   By: Ryan Salvage M.D.   On: 12/29/2023 18:46   DG Chest Port 1 View Result Date: 12/29/2023 CLINICAL DATA:  Shortness of breath, recent pneumothorax EXAM: PORTABLE CHEST 1 VIEW COMPARISON:  12/22/2023 FINDINGS: Large right pneumothorax with near complete collapse of the right lung. There are some signs of tension physiology with leftward mediastinal shift. The left lung is clear. Stable heart size. No displaced rib fracture. IMPRESSION: Large right pneumothorax with signs of tension physiology. Critical Value/emergent results were called by telephone at the time of interpretation on 12/29/2023 at 6:17 pm to provider DAVID YAO , who verbally acknowledged these results. Electronically Signed   By: Norman Gatlin M.D.   On: 12/29/2023 18:24    Scheduled Meds: Continuous Infusions:  lactated ringers  125 mL/hr at 12/30/23 0657     LOS: 1 day   Ivonne Mustache, MD [facility] P7/13/2025, 8:17 AM

## 2023-12-30 NOTE — Progress Notes (Addendum)
 Upon walking into pt room, pt in agony d/t pain, MD Amrit notified.  Pain regimen adjusted.  1mg  Dilaudid  given, pt reports relief.  On 2L 02 via nasal canula satting 88%, increase to 3L O2 satting 92%

## 2023-12-31 ENCOUNTER — Inpatient Hospital Stay (HOSPITAL_COMMUNITY)

## 2023-12-31 ENCOUNTER — Encounter (HOSPITAL_COMMUNITY): Payer: Self-pay | Admitting: Internal Medicine

## 2023-12-31 ENCOUNTER — Encounter (HOSPITAL_COMMUNITY)
Admission: EM | Disposition: A | Discharge status: Home Health | Payer: Self-pay | Source: Home / Self Care | Attending: Student

## 2023-12-31 ENCOUNTER — Inpatient Hospital Stay (HOSPITAL_COMMUNITY): Admitting: Certified Registered Nurse Anesthetist

## 2023-12-31 ENCOUNTER — Telehealth: Payer: Self-pay

## 2023-12-31 ENCOUNTER — Other Ambulatory Visit: Payer: Self-pay

## 2023-12-31 DIAGNOSIS — J449 Chronic obstructive pulmonary disease, unspecified: Secondary | ICD-10-CM

## 2023-12-31 DIAGNOSIS — Z902 Acquired absence of lung [part of]: Secondary | ICD-10-CM

## 2023-12-31 DIAGNOSIS — I10 Essential (primary) hypertension: Secondary | ICD-10-CM

## 2023-12-31 DIAGNOSIS — J9383 Other pneumothorax: Secondary | ICD-10-CM

## 2023-12-31 DIAGNOSIS — F17210 Nicotine dependence, cigarettes, uncomplicated: Secondary | ICD-10-CM

## 2023-12-31 DIAGNOSIS — J9311 Primary spontaneous pneumothorax: Secondary | ICD-10-CM | POA: Diagnosis not present

## 2023-12-31 HISTORY — PX: THORACOSCOPY, ROBOT-ASSISTED: SHX7645

## 2023-12-31 HISTORY — PX: INTERCOSTAL NERVE BLOCK: SHX5021

## 2023-12-31 LAB — POCT I-STAT 7, (LYTES, BLD GAS, ICA,H+H)
Acid-Base Excess: 0 mmol/L (ref 0.0–2.0)
Acid-base deficit: 3 mmol/L — ABNORMAL HIGH (ref 0.0–2.0)
Acid-base deficit: 2 mmol/L (ref 0.0–2.0)
Acid-base deficit: 1 mmol/L (ref 0.0–2.0)
Bicarbonate: 26.9 mmol/L (ref 20.0–28.0)
Bicarbonate: 28.2 mmol/L — ABNORMAL HIGH (ref 20.0–28.0)
Bicarbonate: 27.3 mmol/L (ref 20.0–28.0)
Bicarbonate: 25.6 mmol/L (ref 20.0–28.0)
Calcium, Ion: 1.3 mmol/L (ref 1.15–1.40)
Calcium, Ion: 1.26 mmol/L (ref 1.15–1.40)
Calcium, Ion: 1.25 mmol/L (ref 1.15–1.40)
Calcium, Ion: 1.2 mmol/L (ref 1.15–1.40)
HCT: 37 % — ABNORMAL LOW (ref 39.0–52.0)
HCT: 36 % — ABNORMAL LOW (ref 39.0–52.0)
HCT: 34 % — ABNORMAL LOW (ref 39.0–52.0)
HCT: 33 % — ABNORMAL LOW (ref 39.0–52.0)
Hemoglobin: 12.6 g/dL — ABNORMAL LOW (ref 13.0–17.0)
Hemoglobin: 12.2 g/dL — ABNORMAL LOW (ref 13.0–17.0)
Hemoglobin: 11.6 g/dL — ABNORMAL LOW (ref 13.0–17.0)
Hemoglobin: 11.2 g/dL — ABNORMAL LOW (ref 13.0–17.0)
O2 Saturation: 84 %
O2 Saturation: 100 %
O2 Saturation: 84 %
O2 Saturation: 99 %
Patient temperature: 37.9
Patient temperature: 98.7
Patient temperature: 98.2
Potassium: 4.5 mmol/L (ref 3.5–5.1)
Potassium: 4.6 mmol/L (ref 3.5–5.1)
Potassium: 4.6 mmol/L (ref 3.5–5.1)
Potassium: 4.8 mmol/L (ref 3.5–5.1)
Sodium: 138 mmol/L (ref 135–145)
Sodium: 137 mmol/L (ref 135–145)
Sodium: 139 mmol/L (ref 135–145)
Sodium: 138 mmol/L (ref 135–145)
TCO2: 29 mmol/L (ref 22–32)
TCO2: 31 mmol/L (ref 22–32)
TCO2: 29 mmol/L (ref 22–32)
TCO2: 27 mmol/L (ref 22–32)
pCO2 arterial: 79.1 mmHg (ref 32–48)
pCO2 arterial: 75.4 mmHg (ref 32–48)
pCO2 arterial: 60.5 mmHg — ABNORMAL HIGH (ref 32–48)
pCO2 arterial: 42 mmHg (ref 32–48)
pH, Arterial: 7.145 — CL (ref 7.35–7.45)
pH, Arterial: 7.181 — CL (ref 7.35–7.45)
pH, Arterial: 7.263 — ABNORMAL LOW (ref 7.35–7.45)
pH, Arterial: 7.392 (ref 7.35–7.45)
pO2, Arterial: 68 mmHg — ABNORMAL LOW (ref 83–108)
pO2, Arterial: 365 mmHg — ABNORMAL HIGH (ref 83–108)
pO2, Arterial: 58 mmHg — ABNORMAL LOW (ref 83–108)
pO2, Arterial: 124 mmHg — ABNORMAL HIGH (ref 83–108)

## 2023-12-31 LAB — SURGICAL PCR SCREEN
MRSA, PCR: NEGATIVE
Staphylococcus aureus: POSITIVE — AB

## 2023-12-31 LAB — GLUCOSE, CAPILLARY: Glucose-Capillary: 106 mg/dL — ABNORMAL HIGH (ref 70–99)

## 2023-12-31 SURGERY — THORACOSCOPY, ROBOT-ASSISTED
Anesthesia: General | Site: Chest | Laterality: Right

## 2023-12-31 MED ORDER — LIDOCAINE 2% (20 MG/ML) 5 ML SYRINGE
INTRAMUSCULAR | Status: DC | PRN
  Administered 2023-12-31: 60 mg via INTRAVENOUS

## 2023-12-31 MED ORDER — CHLORHEXIDINE GLUCONATE 0.12 % MT SOLN
OROMUCOSAL | Status: AC
  Administered 2023-12-31: 15 mL via OROMUCOSAL
  Filled 2023-12-31: qty 15

## 2023-12-31 MED ORDER — ALBUTEROL SULFATE HFA 108 (90 BASE) MCG/ACT IN AERS
INHALATION_SPRAY | RESPIRATORY_TRACT | Status: AC
  Filled 2023-12-31: qty 6.7

## 2023-12-31 MED ORDER — BUPIVACAINE LIPOSOME 1.3 % IJ SUSP
INTRAMUSCULAR | Status: AC
  Filled 2023-12-31: qty 20

## 2023-12-31 MED ORDER — FENTANYL 2500MCG IN NS 250ML (10MCG/ML) PREMIX INFUSION
0.0000 ug/h | INTRAVENOUS | Status: DC
  Administered 2023-12-31: 25 ug/h via INTRAVENOUS
  Administered 2024-01-01: 100 ug/h via INTRAVENOUS
  Filled 2023-12-31 (×2): qty 250

## 2023-12-31 MED ORDER — DEXAMETHASONE SODIUM PHOSPHATE 10 MG/ML IJ SOLN
INTRAMUSCULAR | Status: AC
Start: 2023-12-31 — End: 2023-12-31
  Filled 2023-12-31: qty 1

## 2023-12-31 MED ORDER — FENTANYL CITRATE (PF) 250 MCG/5ML IJ SOLN
INTRAMUSCULAR | Status: DC | PRN
  Administered 2023-12-31: 150 ug via INTRAVENOUS
  Administered 2023-12-31 (×2): 50 ug via INTRAVENOUS

## 2023-12-31 MED ORDER — TRAMADOL HCL 50 MG PO TABS
50.0000 mg | ORAL_TABLET | Freq: Four times a day (QID) | ORAL | Status: DC | PRN
  Administered 2024-01-02 – 2024-01-12 (×11): 100 mg via ORAL
  Administered 2024-01-12: 50 mg via ORAL
  Administered 2024-01-13 – 2024-01-15 (×7): 100 mg via ORAL
  Filled 2023-12-31 (×2): qty 2
  Filled 2023-12-31: qty 1
  Filled 2023-12-31 (×18): qty 2

## 2023-12-31 MED ORDER — ORAL CARE MOUTH RINSE
15.0000 mL | OROMUCOSAL | Status: DC
  Administered 2023-12-31 – 2024-01-03 (×27): 15 mL via OROMUCOSAL

## 2023-12-31 MED ORDER — BISACODYL 5 MG PO TBEC
10.0000 mg | DELAYED_RELEASE_TABLET | Freq: Every day | ORAL | Status: DC
  Administered 2024-01-01 – 2024-01-15 (×13): 10 mg via ORAL
  Filled 2023-12-31 (×14): qty 2

## 2023-12-31 MED ORDER — ALBUTEROL SULFATE HFA 108 (90 BASE) MCG/ACT IN AERS
INHALATION_SPRAY | RESPIRATORY_TRACT | Status: DC | PRN
Start: 2023-12-31 — End: 2023-12-31
  Administered 2023-12-31: 10 via RESPIRATORY_TRACT

## 2023-12-31 MED ORDER — PHENYLEPHRINE HCL-NACL 20-0.9 MG/250ML-% IV SOLN
10.0000 ug/min | INTRAVENOUS | Status: DC
  Administered 2024-01-01: 20 ug/min via INTRAVENOUS
  Administered 2024-01-02: 35 ug/min via INTRAVENOUS
  Administered 2024-01-02: 60 ug/min via INTRAVENOUS
  Filled 2023-12-31 (×4): qty 250

## 2023-12-31 MED ORDER — ORAL CARE MOUTH RINSE
15.0000 mL | Freq: Once | OROMUCOSAL | Status: AC
Start: 2023-12-31 — End: 2023-12-31

## 2023-12-31 MED ORDER — PROPOFOL 10 MG/ML IV BOLUS
INTRAVENOUS | Status: AC
  Filled 2023-12-31: qty 20

## 2023-12-31 MED ORDER — CHLORHEXIDINE GLUCONATE 0.12 % MT SOLN: 15.0000 mL | Freq: Once | OROMUCOSAL | Status: AC

## 2023-12-31 MED ORDER — PROPOFOL 500 MG/50ML IV EMUL
INTRAVENOUS | Status: DC | PRN
  Administered 2023-12-31: 50 ug/kg/min via INTRAVENOUS

## 2023-12-31 MED ORDER — BUPIVACAINE LIPOSOME 1.3 % IJ SUSP
INTRAMUSCULAR | Status: DC | PRN
  Administered 2023-12-31: 100 mL

## 2023-12-31 MED ORDER — 0.9 % SODIUM CHLORIDE (POUR BTL) OPTIME
TOPICAL | Status: DC | PRN
  Administered 2023-12-31: 2000 mL

## 2023-12-31 MED ORDER — FENTANYL CITRATE (PF) 100 MCG/2ML IJ SOLN
INTRAMUSCULAR | Status: AC
  Administered 2023-12-31: 50 ug via INTRAVENOUS
  Filled 2023-12-31: qty 2

## 2023-12-31 MED ORDER — FENTANYL BOLUS VIA INFUSION
25.0000 ug | INTRAVENOUS | Status: DC | PRN
  Administered 2024-01-01: 100 ug via INTRAVENOUS
  Administered 2024-01-01: 50 ug via INTRAVENOUS
  Administered 2024-01-01 (×4): 100 ug via INTRAVENOUS

## 2023-12-31 MED ORDER — ALLOPURINOL 300 MG PO TABS
300.0000 mg | ORAL_TABLET | Freq: Every day | ORAL | Status: DC
  Administered 2024-01-01 – 2024-01-15 (×15): 300 mg via ORAL
  Filled 2023-12-31 (×15): qty 1

## 2023-12-31 MED ORDER — POLYETHYLENE GLYCOL 3350 17 G PO PACK
17.0000 g | PACK | Freq: Every day | ORAL | Status: DC
  Administered 2024-01-01: 17 g

## 2023-12-31 MED ORDER — KETOROLAC TROMETHAMINE 15 MG/ML IJ SOLN
15.0000 mg | Freq: Four times a day (QID) | INTRAMUSCULAR | Status: AC
  Administered 2023-12-31 – 2024-01-02 (×8): 15 mg via INTRAVENOUS
  Filled 2023-12-31 (×8): qty 1

## 2023-12-31 MED ORDER — ACETAMINOPHEN 500 MG PO TABS
1000.0000 mg | ORAL_TABLET | Freq: Four times a day (QID) | ORAL | Status: AC
  Administered 2024-01-02 – 2024-01-05 (×10): 1000 mg via ORAL
  Filled 2023-12-31 (×10): qty 2

## 2023-12-31 MED ORDER — MIDAZOLAM HCL 2 MG/2ML IJ SOLN
INTRAMUSCULAR | Status: DC | PRN
  Administered 2023-12-31: 2 mg via INTRAVENOUS

## 2023-12-31 MED ORDER — FENTANYL CITRATE (PF) 100 MCG/2ML IJ SOLN: 50.0000 ug | Freq: Once | INTRAMUSCULAR | Status: AC

## 2023-12-31 MED ORDER — PHENYLEPHRINE 80 MCG/ML (10ML) SYRINGE FOR IV PUSH (FOR BLOOD PRESSURE SUPPORT)
PREFILLED_SYRINGE | INTRAVENOUS | Status: DC | PRN
  Administered 2023-12-31: 160 ug via INTRAVENOUS
  Administered 2023-12-31 (×4): 80 ug via INTRAVENOUS

## 2023-12-31 MED ORDER — LACTATED RINGERS IV SOLN: INTRAVENOUS | Status: DC

## 2023-12-31 MED ORDER — CHLORHEXIDINE GLUCONATE 0.12 % MT SOLN
15.0000 mL | Freq: Once | OROMUCOSAL | Status: AC
  Administered 2023-12-31: 15 mL via OROMUCOSAL
  Filled 2023-12-31: qty 15

## 2023-12-31 MED ORDER — DOCUSATE SODIUM 50 MG/5ML PO LIQD
100.0000 mg | Freq: Two times a day (BID) | ORAL | Status: DC
  Administered 2023-12-31 – 2024-01-01 (×3): 100 mg
  Filled 2023-12-31 (×3): qty 10

## 2023-12-31 MED ORDER — ROCURONIUM BROMIDE 10 MG/ML (PF) SYRINGE
PREFILLED_SYRINGE | INTRAVENOUS | Status: DC | PRN
  Administered 2023-12-31: 30 mg via INTRAVENOUS
  Administered 2023-12-31 (×2): 50 mg via INTRAVENOUS

## 2023-12-31 MED ORDER — PROPOFOL 10 MG/ML IV BOLUS
INTRAVENOUS | Status: DC | PRN
  Administered 2023-12-31: 150 mg via INTRAVENOUS

## 2023-12-31 MED ORDER — ORAL CARE MOUTH RINSE
15.0000 mL | OROMUCOSAL | Status: DC | PRN
Start: 2023-12-31 — End: 2024-01-03

## 2023-12-31 MED ORDER — SODIUM CHLORIDE 0.9 % IV SOLN
250.0000 mL | INTRAVENOUS | Status: AC
  Administered 2023-12-31: 250 mL via INTRAVENOUS

## 2023-12-31 MED ORDER — HYDROMORPHONE HCL 1 MG/ML IJ SOLN
INTRAMUSCULAR | Status: AC
  Administered 2023-12-31: 0.5 mg via INTRAVENOUS
  Filled 2023-12-31: qty 1

## 2023-12-31 MED ORDER — FENTANYL CITRATE PF 50 MCG/ML IJ SOSY: 50.0000 ug | PREFILLED_SYRINGE | INTRAMUSCULAR | Status: DC | PRN

## 2023-12-31 MED ORDER — ONDANSETRON HCL 4 MG/2ML IJ SOLN
INTRAMUSCULAR | Status: AC
  Filled 2023-12-31: qty 2

## 2023-12-31 MED ORDER — CHLORHEXIDINE GLUCONATE CLOTH 2 % EX PADS
6.0000 | MEDICATED_PAD | Freq: Every day | CUTANEOUS | Status: AC
  Administered 2023-12-31 – 2024-01-04 (×5): 6 via TOPICAL

## 2023-12-31 MED ORDER — FENTANYL CITRATE (PF) 250 MCG/5ML IJ SOLN
INTRAMUSCULAR | Status: AC
  Filled 2023-12-31: qty 5

## 2023-12-31 MED ORDER — FENTANYL CITRATE PF 50 MCG/ML IJ SOSY: 25.0000 ug | PREFILLED_SYRINGE | Freq: Once | INTRAMUSCULAR | Status: AC

## 2023-12-31 MED ORDER — GABAPENTIN 300 MG PO CAPS
300.0000 mg | ORAL_CAPSULE | Freq: Every day | ORAL | Status: DC
  Administered 2024-01-01 – 2024-01-02 (×2): 300 mg via ORAL
  Filled 2023-12-31 (×2): qty 1

## 2023-12-31 MED ORDER — SUGAMMADEX SODIUM 200 MG/2ML IV SOLN
INTRAVENOUS | Status: AC
  Filled 2023-12-31: qty 2

## 2023-12-31 MED ORDER — PROPOFOL 1000 MG/100ML IV EMUL
0.0000 ug/kg/min | INTRAVENOUS | Status: DC
  Administered 2023-12-31: 70 ug/kg/min via INTRAVENOUS
  Administered 2023-12-31: 80 ug/kg/min via INTRAVENOUS
  Administered 2023-12-31: 70 ug/kg/min via INTRAVENOUS
  Administered 2024-01-01 (×2): 60 ug/kg/min via INTRAVENOUS
  Administered 2024-01-01: 45 ug/kg/min via INTRAVENOUS
  Administered 2024-01-01: 55 ug/kg/min via INTRAVENOUS
  Administered 2024-01-01 – 2024-01-02 (×4): 70 ug/kg/min via INTRAVENOUS
  Filled 2023-12-31 (×2): qty 100
  Filled 2023-12-31: qty 200
  Filled 2023-12-31: qty 100
  Filled 2023-12-31: qty 200
  Filled 2023-12-31 (×4): qty 100

## 2023-12-31 MED ORDER — FENTANYL CITRATE PF 50 MCG/ML IJ SOSY
50.0000 ug | PREFILLED_SYRINGE | INTRAMUSCULAR | Status: DC | PRN
  Administered 2023-12-31 (×3): 100 ug via INTRAVENOUS
  Administered 2023-12-31: 50 ug via INTRAVENOUS
  Administered 2024-01-01: 100 ug via INTRAVENOUS
  Administered 2024-01-01: 200 ug via INTRAVENOUS
  Administered 2024-01-01 – 2024-01-02 (×3): 100 ug via INTRAVENOUS
  Administered 2024-01-02: 200 ug via INTRAVENOUS
  Filled 2023-12-31: qty 4
  Filled 2023-12-31: qty 2

## 2023-12-31 MED ORDER — MIDAZOLAM HCL 2 MG/2ML IJ SOLN
INTRAMUSCULAR | Status: AC
  Filled 2023-12-31: qty 2

## 2023-12-31 MED ORDER — ACETAMINOPHEN 160 MG/5ML PO SOLN
1000.0000 mg | Freq: Four times a day (QID) | ORAL | Status: AC
  Administered 2023-12-31 – 2024-01-04 (×7): 1000 mg via ORAL
  Filled 2023-12-31 (×7): qty 40.6

## 2023-12-31 MED ORDER — MUPIROCIN 2 % EX OINT
1.0000 | TOPICAL_OINTMENT | Freq: Two times a day (BID) | CUTANEOUS | Status: AC
  Administered 2023-12-31 – 2024-01-05 (×10): 1 via NASAL
  Filled 2023-12-31 (×2): qty 22

## 2023-12-31 MED ORDER — DEXAMETHASONE SODIUM PHOSPHATE 10 MG/ML IJ SOLN
INTRAMUSCULAR | Status: DC | PRN
  Administered 2023-12-31: 10 mg via INTRAVENOUS

## 2023-12-31 MED ORDER — SODIUM CHLORIDE (PF) 0.9 % IJ SOLN
INTRAMUSCULAR | Status: AC
Start: 2023-12-31 — End: 2023-12-31
  Filled 2023-12-31: qty 50

## 2023-12-31 MED ORDER — HYDROMORPHONE HCL 1 MG/ML IJ SOLN: 0.5000 mg | Freq: Once | INTRAMUSCULAR | Status: AC

## 2023-12-31 MED ORDER — ONDANSETRON HCL 4 MG/2ML IJ SOLN
INTRAMUSCULAR | Status: DC | PRN
  Administered 2023-12-31: 4 mg via INTRAVENOUS

## 2023-12-31 MED ORDER — BUPIVACAINE HCL (PF) 0.5 % IJ SOLN
INTRAMUSCULAR | Status: AC
  Filled 2023-12-31: qty 30

## 2023-12-31 MED ORDER — CEFAZOLIN SODIUM-DEXTROSE 2-4 GM/100ML-% IV SOLN
2.0000 g | Freq: Three times a day (TID) | INTRAVENOUS | Status: AC
  Administered 2023-12-31 (×2): 2 g via INTRAVENOUS
  Filled 2023-12-31 (×2): qty 100

## 2023-12-31 MED ORDER — PANTOPRAZOLE SODIUM 40 MG PO TBEC
40.0000 mg | DELAYED_RELEASE_TABLET | Freq: Every day | ORAL | Status: DC
  Administered 2024-01-02 – 2024-01-15 (×14): 40 mg via ORAL
  Filled 2023-12-31 (×14): qty 1

## 2023-12-31 SURGICAL SUPPLY — 61 items
BLADE CLIPPER SURG (BLADE) ×1 IMPLANT
CANISTER SUCTION 3000ML PPV (SUCTIONS) ×2 IMPLANT
CANNULA REDUCER 12-8 DVNC XI (CANNULA) ×2 IMPLANT
CATH THORACIC 28FR (CATHETERS) IMPLANT
CHLORAPREP W/TINT 26 (MISCELLANEOUS) ×1 IMPLANT
CLEANER TIP ELECTROSURG 2X2 (MISCELLANEOUS) IMPLANT
CNTNR URN SCR LID CUP LEK RST (MISCELLANEOUS) ×2 IMPLANT
CONN ST 1/4X3/8 BEN (MISCELLANEOUS) IMPLANT
DEFOGGER SCOPE WARM SEASHARP (MISCELLANEOUS) ×1 IMPLANT
DERMABOND ADVANCED .7 DNX12 (GAUZE/BANDAGES/DRESSINGS) ×1 IMPLANT
DRAIN CHANNEL 28F RND 3/8 FF (WOUND CARE) IMPLANT
DRAPE ARM DVNC X/XI (DISPOSABLE) ×4 IMPLANT
DRAPE COLUMN DVNC XI (DISPOSABLE) ×1 IMPLANT
DRAPE CV SPLIT W-CLR ANES SCRN (DRAPES) ×1 IMPLANT
DRAPE SURG ORHT 6 SPLT 77X108 (DRAPES) ×1 IMPLANT
DRSG TEGADERM 2-3/8X2-3/4 SM (GAUZE/BANDAGES/DRESSINGS) IMPLANT
ELECTRODE REM PT RTRN 9FT ADLT (ELECTROSURGICAL) ×1 IMPLANT
FORCEPS BPLR LNG DVNC XI (INSTRUMENTS) IMPLANT
FORCEPS CADIERE DVNC XI (FORCEP) IMPLANT
GAUZE KITTNER 4X5 RF (MISCELLANEOUS) ×1 IMPLANT
GAUZE SPONGE 4X4 12PLY STRL (GAUZE/BANDAGES/DRESSINGS) IMPLANT
GLOVE BIO SURGEON STRL SZ7.5 (GLOVE) ×2 IMPLANT
GOWN STRL REUS W/ TWL LRG LVL3 (GOWN DISPOSABLE) ×2 IMPLANT
GOWN STRL REUS W/ TWL XL LVL3 (GOWN DISPOSABLE) ×2 IMPLANT
GOWN STRL REUS W/TWL 2XL LVL3 (GOWN DISPOSABLE) ×1 IMPLANT
GRASPER TIP-UP FEN DVNC XI (INSTRUMENTS) IMPLANT
HEMOSTAT SURGICEL 2X14 (HEMOSTASIS) ×3 IMPLANT
IRRIGATOR SUCT 8 DISP DVNC XI (IRRIGATION / IRRIGATOR) IMPLANT
KIT BASIN OR (CUSTOM PROCEDURE TRAY) ×1 IMPLANT
KIT TURNOVER KIT B (KITS) ×1 IMPLANT
NDL 22X1.5 STRL (OR ONLY) (MISCELLANEOUS) ×1 IMPLANT
NEEDLE 22X1.5 STRL (OR ONLY) (MISCELLANEOUS) ×1 IMPLANT
NS IRRIG 1000ML POUR BTL (IV SOLUTION) ×3 IMPLANT
PACK CHEST (CUSTOM PROCEDURE TRAY) ×1 IMPLANT
PAD ARMBOARD POSITIONER FOAM (MISCELLANEOUS) ×5 IMPLANT
PORT ACCESS TROCAR AIRSEAL 12 (TROCAR) ×1 IMPLANT
RELOAD STAPLE 45 3.5 BLU DVNC (STAPLE) IMPLANT
RELOAD STAPLER 3.5X45 BLU DVNC (STAPLE) ×5 IMPLANT
SEAL UNIV 5-12 XI (MISCELLANEOUS) ×4 IMPLANT
SET TRI-LUMEN FLTR TB AIRSEAL (TUBING) ×1 IMPLANT
SOLUTION ELECTROSURG ANTI STCK (MISCELLANEOUS) IMPLANT
STAPLER 45 SUREFORM DVNC (STAPLE) IMPLANT
STOPCOCK 4 WAY LG BORE MALE ST (IV SETS) ×1 IMPLANT
SUT SILK 1 MH (SUTURE) ×1 IMPLANT
SUT SILK 2 0 SH (SUTURE) IMPLANT
SUT SILK 2 0SH CR/8 30 (SUTURE) IMPLANT
SUT VIC AB 1 CTX36XBRD ANBCTR (SUTURE) IMPLANT
SUT VIC AB 2-0 CT1 TAPERPNT 27 (SUTURE) ×1 IMPLANT
SUT VIC AB 3-0 SH 27X BRD (SUTURE) ×2 IMPLANT
SUT VICRYL 0 TIES 12 18 (SUTURE) ×1 IMPLANT
SUT VICRYL 0 UR6 27IN ABS (SUTURE) ×2 IMPLANT
SYR 10ML LL (SYRINGE) ×1 IMPLANT
SYR 20ML LL LF (SYRINGE) ×1 IMPLANT
SYR 50ML LL SCALE MARK (SYRINGE) ×1 IMPLANT
SYSTEM SAHARA CHEST DRAIN ATS (WOUND CARE) ×1 IMPLANT
TAPE CLOTH 4X10 WHT NS (GAUZE/BANDAGES/DRESSINGS) ×1 IMPLANT
TAPE CLOTH SURG 4X10 WHT LF (GAUZE/BANDAGES/DRESSINGS) IMPLANT
TOWEL GREEN STERILE (TOWEL DISPOSABLE) ×1 IMPLANT
TRAY FOLEY MTR SLVR 16FR STAT (SET/KITS/TRAYS/PACK) ×1 IMPLANT
TUBING EXTENTION W/L.L. (IV SETS) ×1 IMPLANT
WATER STERILE IRR 1000ML POUR (IV SOLUTION) ×1 IMPLANT

## 2023-12-31 NOTE — Brief Op Note (Signed)
 12/29/2023 - 12/31/2023  1:36 PM  PATIENT:  Albert Hill  48 y.o. male  PRE-OPERATIVE DIAGNOSIS:  Recurrent right spontaneous pneumothorax  POST-OPERATIVE DIAGNOSIS:  Recurrent right spontaneous pneumothorax  PROCEDURE: ROBOT-ASSISTED RIGHT  THORACOSCOPY for WEDGE of RUL ( APICAL BLEB RESECTION), RIGHT PLEURECTOMY, and PLEURAL ABRASION  SURGEON:  Surgeons and Role:    Shyrl Linnie KIDD, MD - Primary  PHYSICIAN ASSISTANT: Kyla Donald PA-C  ANESTHESIA:   general  EBL: Minimal  BLOOD ADMINISTERED:none  DRAINS: Chest tube placed in the right pleural space   LOCAL MEDICATIONS USED:  OTHER Exparel   SPECIMEN:  Source of Specimen:  Wedge RUL  DISPOSITION OF SPECIMEN:  PATHOLOGY  COUNTS CORRECT:  YES  DICTATION: .Dragon Dictation  PLAN OF CARE: Admit to inpatient   PATIENT DISPOSITION:  PACU - hemodynamically stable.   Delay start of Pharmacological VTE agent (>24hrs) due to surgical blood loss or risk of bleeding: no

## 2023-12-31 NOTE — Progress Notes (Signed)
   Telephone encounter was:  Unsuccessful.  12/31/2023 Name: JUSTINE COSSIN MRN: [id number] DOB: 1976-04-15  Unsuccessful outbound call made today to assist with:  Food Insecurity and Financial Difficulties related to financial strain  Outreach Attempt:  2nd Attempt  No answer and unable to leave a message    Jon Colt [facility]  [facility] Guide, Phone: [phone] Fax: [phone] Website: [email or web address]

## 2023-12-31 NOTE — Plan of Care (Signed)
  Problem: Education: Goal: Knowledge of General Education information will improve Description: Including pain rating scale, medication(s)/side effects and non-pharmacologic comfort measures Outcome: Progressing   Problem: Clinical Measurements: Goal: Will remain free from infection Outcome: Progressing   Problem: Nutrition: Goal: Adequate nutrition will be maintained Outcome: Progressing   Problem: Coping: Goal: Level of anxiety will decrease Outcome: Progressing   Problem: Elimination: Goal: Will not experience complications related to bowel motility Outcome: Progressing   Problem: Safety: Goal: Ability to remain free from injury will improve Outcome: Progressing   Problem: Skin Integrity: Goal: Risk for impaired skin integrity will decrease Outcome: Progressing   Problem: Pain Managment: Goal: General experience of comfort will improve and/or be controlled Outcome: Not Progressing

## 2023-12-31 NOTE — Op Note (Signed)
      [address]       [place] [address]             [phone]        12/31/2023  Patient:  Albert Hill Pre-Op Dx: recurrent right spontaneous pneumothorax   Post-op Dx:  same Procedure: - Robotic assisted right video thoracoscopy - Wedge resection of the right upper lobe - Apical pleurectomy - Mechanical pleurodesis - Intercostal nerve block  Surgeon and Role:      * Albert Linnie KIDD, MD - Primary Assistant: D.Zimmerman, PA-C An experienced assistant was required given the complexity of this surgery and the standard of surgical care. The assistant was needed for exposure, dissection, suctioning, retraction of delicate tissues and sutures, instrument exchange and for overall help during this procedure.  Anesthesia  general EBL:  15ml Blood Administration: none Specimen:  Wedge resection of the right upper lobe.  Apical pleura.  Drains: 30 F argyle chest tube in right chest Counts: correct   Indications: 48yo male with multiple medical problems present with a recurrent right pneumothorax.  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   Findings: Bullous emphysema Murky pleural effusion  Operative Technique: After the risks, benefits and alternatives were thoroughly discussed, the patient was brought to the operative theatre.  Anesthesia was induced, and the patient was then placed in a left lateral decubitus position and was prepped and draped in normal sterile fashion.  An appropriate surgical pause was performed, and pre-operative antibiotics were dosed accordingly.  We began by placing our 3 robotic ports in the 7th intercostal space targeting the hilum of the lung.  The robot was then docked and all instruments were passed under direct visualization.    The lung was then retracted superiorly, and the inferior pulmonary ligament was divided.  The lung was freed of all pleural adhesions.  Wedge resections of the right upper lobe were performed.   The apical parietal pleural was removed with a combination of cautery and blunt dissection. The chest was irrigated, and an air leak test was performed.  An intercostal nerve block was performed under direct visualization.  A mechanical pleurodesis was then performed.  A 15F chest with then placed, and we watch the remaining lobes re-expand.  The skin and soft tissue were closed with absorbable suture.    The patient tolerated the procedure without any immediate complications, and was transferred to the PACU in stable condition.  Albert Hill Albert

## 2023-12-31 NOTE — Progress Notes (Signed)
 Pt c/o of pain 10/10 on pain scale shortly after IV dilaudid  and toradol  administration.  Pt states the pain is unbearable and I can't take this anymore. Overnight Provider Blondie was notified and an early dose of iv dilaudid  was given. Upon reassessment of pain, pt reports pain relief at this time. Will continue to monitor.

## 2023-12-31 NOTE — Anesthesia Procedure Notes (Addendum)
 Procedure Name: Intubation Date/Time: 12/31/2023 2:12 PM  Performed by: Catheline Terrall NOVAK, RNPre-anesthesia Checklist: Patient identified, Emergency Drugs available, Suction available and Patient being monitored Patient Re-evaluated:Patient Re-evaluated prior to induction Oxygen  Delivery Method: Circle system utilized Preoxygenation: Pre-oxygenation with 100% oxygen  Ventilation: Mask ventilation without difficulty Tube type: Oral Tube size: 8.0 mm Number of attempts: 1 Airway Equipment and Method: Stylet and Oral airway (Tube exchanger utilized) Placement Confirmation: ETT inserted through vocal cords under direct vision, positive ETCO2 and breath sounds checked- equal and bilateral Secured at: 23 cm Tube secured with: Tape Dental Injury: Teeth and Oropharynx as per pre-operative assessment

## 2023-12-31 NOTE — Anesthesia Procedure Notes (Addendum)
 Procedure Name: Intubation Date/Time: 12/31/2023 12:20 PM  Performed by: Catheline Terrall NOVAK, RNPre-anesthesia Checklist: Patient identified, Emergency Drugs available, Suction available and Patient being monitored Patient Re-evaluated:Patient Re-evaluated prior to induction Oxygen  Delivery Method: Circle system utilized Preoxygenation: Pre-oxygenation with 100% oxygen  Induction Type: IV induction Ventilation: Mask ventilation without difficulty Laryngoscope Size: Mac and 4 Grade View: Grade I Tube type: Oral Endobronchial tube: Double lumen EBT and 41 Fr Number of attempts: 1 Airway Equipment and Method: Stylet Placement Confirmation: ETT inserted through vocal cords under direct vision, positive ETCO2 and breath sounds checked- equal and bilateral Tube secured with: Tape Dental Injury: Teeth and Oropharynx as per pre-operative assessment  Comments: SRNA intubation

## 2023-12-31 NOTE — Anesthesia Procedure Notes (Signed)
 Arterial Line Insertion Start/End7/14/2025 10:40 AM, 12/31/2023 10:45 AM Performed by: Claudene Arlin LABOR, CRNA, CRNA  Patient location: Pre-op. Preanesthetic checklist: patient identified, IV checked, site marked, risks and benefits discussed, surgical consent, monitors and equipment checked, pre-op evaluation, timeout performed and anesthesia consent Lidocaine  1% used for infiltration Left, radial was placed Catheter size: 20 G Hand hygiene performed  and maximum sterile barriers used  Allen's test indicative of satisfactory collateral circulation Attempts: 1 Procedure performed without using ultrasound guided technique. Following insertion, dressing applied and Biopatch. Post procedure assessment: normal  Patient tolerated the procedure well with no immediate complications.

## 2023-12-31 NOTE — Progress Notes (Signed)
 PROGRESS NOTE  Albert Hill  FMW:[id number] DOB: February 09, 1976 DOA: 12/29/2023 PCP: Lorren Greig PARAS, NP   Brief Narrative: Patient is a 48 year old male with history of pneumothorax about 3 weeks ago, hypertension, tobacco use.  He was recently admitted here and was discharged on 12/22/2023 after he was managed for right-sided pneumothorax.  Patient developed sudden onset of shortness of breath on 7/12 and presented himself to the emergency department.  On presentation, he was hemodynamically stable.  Chest imaging showed right-sided pneumothorax.  Chest tube placed by ED resident.  CT surgery contacted.  Planning for robotic assisted right VATS for bleb resection and pleural abrasion.  Assessment & Plan:  Principal Problem:   Pneumothorax    Recurrent right-sided pneumothorax: He was recently admitted here and was discharged on 12/22/2023 after he was managed for right-sided pneumothorax.  History of undiagnosed underlying COPD/emphysema.  He underwent chest tube placement on the right side and cardiothoracic surgery was following.  Chest tube discontinued on 7/5, repeat chest x-ray was stable and was discharged home.   Developed shortness of breath at home.  Chest imaging showed recurrent right-sided pneumothorax.  Chest tube placed in the emergency department.  CT surgery consulted.  CT chest done for follow-up this morning showed tiny residual right pneumothorax Still complaining of severe pain on the chest tube insertion site today.   CT surgery operating on him today  Acute hypoxic respiratory failure:Likely from underlying pneumothorax, right-sided effusion, underlying COPD.  Now resolved, he is on room air today  History of COPD/tobacco use: Continue nicotine  patch.  Bronchodilators as needed.Patient states he is motivated to quit smoking  History of hypertension: On Zestril  at home.  Patient was slightly hypotensive on presentation.  Currently blood pressure medications on hold.     GERD: Continue PPI        DVT prophylaxis:Pneumatic SCD boots to accompany all patients to O.R. Start: 12/30/23 1858 SCDs Start: 12/29/23 1944     Code Status: Full Code  Family Communication: None at  bedside  Patient status:Inpatient  Patient is from :Home  Anticipated discharge un:Ynfz  Estimated DC date:2-3 days,after CT surgery clearance   Consultants: CT surgery  Procedures:Chest tube placement  Antimicrobials:  Anti-infectives (From admission, onward)    Start     Dose/Rate Route Frequency Ordered Stop   12/30/23 1858  ceFAZolin  (ANCEF ) IVPB 2g/100 mL premix        2 g 200 mL/hr over 30 Minutes Intravenous 30 min pre-op 12/30/23 1858         Subjective: Patient seen and examined at bedside today.  Hemodynamically stable.  He still has severe discomfort on the x-ray chest tube.  He is being prepared to be taken to OR  Objective: Vitals:   12/31/23 0420 12/31/23 0734 12/31/23 0749 12/31/23 0912  BP: (!) 137/97 114/68  126/76  Pulse: 86 83  79  Resp: 16 16  17   Temp: 98.9 F (37.2 C) 97.9 F (36.6 C)  98.4 F (36.9 C)  TempSrc: Oral Oral  Oral  SpO2: 99% 90%  91%  Weight:    69.9 kg  Height:   5' 10 (1.778 m) 5' 10 (1.778 m)    Intake/Output Summary (Last 24 hours) at 12/31/2023 1032 Last data filed at 12/30/2023 2338 Gross per 24 hour  Intake 240 ml  Output 720 ml  Net -480 ml   Filed Weights   12/31/23 0912  Weight: 69.9 kg    Examination:   General exam: Overall comfortable,  not in distress HEENT: PERRL Respiratory system: Mildly diminished breath sounds on the right side, right-sided chest tube Cardiovascular system: S1 & S2 heard, RRR.  Gastrointestinal system: Abdomen is nondistended, soft and nontender. Central nervous system: Alert and oriented Extremities: No edema, no clubbing ,no cyanosis Skin: No rashes, no ulcers,no icterus       Data Reviewed: I have personally reviewed following labs and imaging  studies  CBC: Recent Labs  Lab 12/29/23 1659 12/30/23 0856 12/30/23 2205  WBC 12.0* 9.6 10.2  HGB 13.8 13.3 11.7*  HCT 40.3 39.2 34.6*  MCV 90.6 92.5 90.6  PLT 335 256 223   Basic Metabolic Panel: Recent Labs  Lab 12/29/23 1659 12/30/23 0856 12/30/23 2205  NA 137 134* 137  K 4.2 4.1 4.4  CL 104 102 104  CO2 30 26 27   GLUCOSE 120* 102* 97  BUN 24* 14 15  CREATININE 0.94 0.72 0.76  CALCIUM 9.1 8.5* 8.5*     No results found for this or any previous visit (from the past 240 hours).   Radiology Studies: CT CHEST WO CONTRAST Result Date: 12/31/2023 CLINICAL DATA:  Follow-up pneumothorax EXAM: CT CHEST WITHOUT CONTRAST TECHNIQUE: Multidetector CT imaging of the chest was performed following the standard protocol without IV contrast. RADIATION DOSE REDUCTION: This exam was performed according to the departmental dose-optimization program which includes automated exposure control, adjustment of the mA and/or kV according to patient size and/or use of iterative reconstruction technique. COMPARISON:  Chest x-ray from the previous day. FINDINGS: Cardiovascular: Heart is not significantly enlarged in size. No aortic aneurysmal dilatation is seen. Mild atherosclerotic calcifications of the aorta are noted. Mediastinum/Nodes: Thoracic inlet is within normal limits. No sizable hilar or mediastinal adenopathy is noted. The esophagus as visualized is within normal limits. Lungs/Pleura: Significant emphysematous changes are identified. Tiny anterior right pneumothorax is seen. Pigtail catheter is noted on the right anteriorly. Some subcutaneous air is noted likely related to the recent placement. Small right-sided pleural effusion is noted. Scattered nodules are noted throughout both lungs the largest of which is seen in the right lower lobe measuring 4 mm in dimension best seen on image number 117 of series 4. Some mild left basilar atelectasis is seen. Upper Abdomen: No acute abnormality.  Musculoskeletal: No chest wall mass or suspicious bone lesions identified. IMPRESSION: Tiny residual right pneumothorax is noted. Scattered pulmonary nodules the largest of which measures 4 mm in the right lower lobe. No routine follow-up is recommended. Pigtail catheter noted in satisfactory position. Aortic Atherosclerosis (ICD10-I70.0) and Emphysema (ICD10-J43.9). Electronically Signed   By: Oneil Devonshire M.D.   On: 12/31/2023 01:23   DG CHEST PORT 1 VIEW Result Date: 12/30/2023 EXAM: 1 VIEW XRAY OF THE CHEST 12/30/2023 09:49:58 AM COMPARISON: 1 view chest x-ray 12/29/2023. CLINICAL HISTORY: Pneumothorax. FINDINGS: LUNGS AND PLEURA: A small apical pneumothorax is present. Right basilar airspace opacity and effusion is noted. The left lung is clear. HEART AND MEDIASTINUM: No acute abnormality of the cardiac and mediastinal silhouettes. BONES AND SOFT TISSUES: No acute osseous abnormality. LINES AND TUBES: The pigtail catheter is stable in position. IMPRESSION: 1. Small apical pneumothorax. 2. Right basilar airspace opacity and effusion. 3. Stable pigtail catheter position. Electronically signed by: Lonni Necessary MD 12/30/2023 10:31 AM EDT RP Workstation: [id number]   DG Chest Portable 1 View Result Date: 12/29/2023 CLINICAL DATA:  Chest tube placement.  Right-sided pneumothorax. EXAM: PORTABLE CHEST 1 VIEW COMPARISON:  12/29/2023 at 5:54 p.m. FINDINGS: Pigtail right pleural drainage catheter placed  with substantial re-expansion of the right lung. Suspected moderate residual right apical pneumothorax at about 20% of right hemithoracic volume. Previous tension component has resolved. Bandlike atelectasis at the right lung base. Left lung remains clear. Cardiac and mediastinal margins appear normal. IMPRESSION: 1. Pigtail right pleural drainage catheter placed with substantial re-expansion of the right lung. Suspected moderate residual right apical pneumothorax at about 20% of right hemithoracic volume.  Previous tension component has resolved. 2. Bandlike atelectasis at the right lung base. Electronically Signed   By: Ryan Salvage M.D.   On: 12/29/2023 18:46   DG Chest Port 1 View Result Date: 12/29/2023 CLINICAL DATA:  Shortness of breath, recent pneumothorax EXAM: PORTABLE CHEST 1 VIEW COMPARISON:  12/22/2023 FINDINGS: Large right pneumothorax with near complete collapse of the right lung. There are some signs of tension physiology with leftward mediastinal shift. The left lung is clear. Stable heart size. No displaced rib fracture. IMPRESSION: Large right pneumothorax with signs of tension physiology. Critical Value/emergent results were called by telephone at the time of interpretation on 12/29/2023 at 6:17 pm to provider DAVID YAO , who verbally acknowledged these results. Electronically Signed   By: Norman Gatlin M.D.   On: 12/29/2023 18:24    Scheduled Meds:  [MAR Hold] acetaminophen   1,000 mg Oral QID   [MAR Hold] ketorolac   15 mg Intravenous Q6H   [MAR Hold] polyethylene glycol  17 g Oral Daily   [MAR Hold] senna-docusate  1 tablet Oral BID   Continuous Infusions:   ceFAZolin  (ANCEF ) IV     lactated ringers  10 mL/hr at 12/31/23 0923     LOS: 2 days   Ivonne Mustache, MD [facility] P7/14/2025, 10:32 AM

## 2023-12-31 NOTE — Progress Notes (Signed)
 Nitric started at 20ppm per MD. Vitals are stable on the patient at this time. RN made aware.

## 2023-12-31 NOTE — Discharge Instructions (Addendum)
 Discharge Instructions:  You may shower, please wash incisions daily with soap and water and keep dry.  If you wish to cover wounds with dressing you may do so but please keep clean and change daily.  No tub baths or swimming until incisions have completely healed.  If your incisions become red or develop any drainage please call our office at [phone]  2. No Driving until cleared by Dr. Lang office and you are no longer using narcotic pain medications  3. Fever of 101.5 for at least 24 hours with no source, please contact our office at [phone]  4. Activity- up as tolerated, please walk at least 3 times per day.  Avoid strenuous activity until cleared by Dr. Lang office  5. If any questions or concerns arise, please do not hesitate to contact our office at [phone]  Robot-Assisted Chest (Thoracic) Surgery: What to Know After After robot-assisted chest surgery, it's common to have some pain and aches in the area of your cuts from surgery. You may also have: Pain when breathing in and when coughing. Tiredness. Trouble sleeping. Trouble pooping (constipation). Follow these instructions at home: Medicines Take your medicines only as told. If you were given antibiotics, take them as told. Do not stop taking them even if you start to feel better. Talk with your health care provider about safe and effective ways to manage pain after your surgery. How you manage pain should fit your specific health needs. Take pain medicine before pain becomes severe. Controlling your pain will make breathing easier for you. Ask your provider if it's safe to drive or use machines while taking your medicine. Eating and drinking Eat and drink as told. This will vary depending on what procedure you had. Your provider may recommend: A liquid diet or soft diet for the first few days. Meals that are smaller and more frequent. Limiting foods that are high in fat and processed sugars, such as  fried or sweet foods. You may also need to take these steps to help prevent or treat trouble pooping: Eat foods high in fiber, like beans, whole grains, and fresh fruits and vegetables. Drink more fluids as told. Incision care Take care of your cuts from surgery as told. Make sure you: Wash your hands with soap and water for at least 20 seconds before and after you change your bandage. If you can't use soap and water, use hand sanitizer. Change your bandage. Leave stitches or skin glue alone. Leave tape strips alone unless you're told to take them off. You may trim the edges of the tape strips if they curl up. Check the area around your cuts every day for signs of infection. Check for: Redness, swelling, or pain. Fluid or blood. Warmth. Pus or a bad smell. Activity Ask what things are safe for you to do at home. Ask when you can go back to work or school. Ask your provider when it's safe for you to drive. Do not lift anything heavier than 10 lb (4.5 kg) until you're told it's OK. Rest as told. Get up to take short walks at least every 2 hours during the day. This helps you breathe better and keeps your blood flowing. Ask for help if you feel weak or unsteady. Exercise as told. Pneumonia prevention  Do deep breathing exercises and cough regularly as told. This helps clear mucus and opens your lungs. Doing this helps prevent lung infection (pneumonia). If you were given an incentive spirometer, use it as told. An incentive  spirometer is a tool that measures how well you're filling your lungs with each breath. Do not smoke, vape, or use nicotine  or tobacco. Doing this can slow down healing. Avoid secondhand smoke. General instructions If you have a drainage tube: Follow instructions from your provider about how to take care of it. Do not travel by airplane after your tube is removed until your provider tells you it's safe. Your provider may give you more instructions. Make sure you know  what you can and cannot do. Contact a health care provider if: You have any signs of infection. You have a fever. You throw up each time you eat or drink. Your pain medicine isn't controlling your pain. Get help right away if: You have chest pain. Your heart is beating fast. You have trouble breathing. You have trouble speaking. You are confused. You feel weak or dizzy, or you faint. These symptoms may be an emergency. Call 911 right away. Do not wait to see if the symptoms will go away. Do not drive yourself to the hospital. This information is not intended to replace advice given to you by your health care provider. Make sure you discuss any questions you have with your health care provider. Document Revised: 11/09/2022 Document Reviewed: 11/09/2022 Elsevier Patient Education  2024 [employer].  [employer] Guide Inpatient Behavioral Health/Residential Substance Abuse Treatment - Adults The United Way's "211" is a great source of information about community services available.  Access by dialing 2-1-1 from anywhere in [place] , or by website -  PooledIncome.pl.    (Updated 09/2015)   Crisis Assistance 24 hours a day     Services Offered       Area [employer] 24-hour crisis assistance: (412) 302-6637 [place], [place]  [facility] 24-hour crisis assistance:(860)768-2386 [place], [place]  [facility] 24-hour crisis assistance: (984)773-5196 [place], [place]  [facility] Access to Care Line 24-hour crisis assistance; [phone] All  [facility] 24-hour crisis response line: 808-149-1690 All    Other Local Resources (Updated 06/2015)   Inpatient Behavioral Health/Residential Substance Abuse Treatment Programs     Services          Address and Phone Number  [facility] (Alcohol Drug Abuse Treatment Center)   14-day residential rehabilitation  [phone] 100 [address] [place], [place]  [facility]CA (Addiction  [facility])    Detox - private pay only 14-day residential rehabilitation -  Medicaid, insurance, private pay only [phone], or [phone] [address], [place], [place] [address]   [facility] only Multiple facilities [phone] admissions    [facility] ([facility])   90-day program Must be homeless to participate   [phone], or [phone] Daniel Mcalpine, [facility]  [facility] only [phone], or  [phone] [address] [place], [place] [address]  [facility] Residential Treatment Services Must make an appointment Accepts private pay, Ursula Mosses [facility] [phone]  [address] [place], [place] [address]   [facility] Females only Associated with the [facility] [phone] [address] [place], [place] [address]  [facility] only [phone], or [phone] [address] [place], [place]  [facility]     Detox Residential rehabilitation Private insurance only Multiple locations [phone] admissions  [facility]     Private pay Private insurance [phone] [address] [place], [place] [address]  [facility]     Males only  Fee required at time of admission [phone] [address] [place], [place] [address]  [facility]     Private pay only   [phone] [address] [place], [place]  RTS (Residential Firefighter)    Detox - private pay, Medicaid Residential rehabilitation for males  - Medicare, Medicaid, insurance, private pay [phone] [address] [place], [place]   [facility]              Walk-in interviews Monday - Saturday from 8 am - 4 pm Individuals with legal charges are not eligible [phone] [address] [place], [place] [address]  [facility]  Must be willing to work Must attend Alcoholics  Anonymous meetings [phone] [address] [place], [place]   [facility] [employer] [facility] [employer]    Faith-based program Private pay only [phone] [address] [place], [place]                                        Outpatient Substance Abuse  Treatment- uninsured  Narcotics Anonymous 24-HOUR HELPLINE Pre-recorded for Meeting Schedules [facility] [phone]  [email or web address] ALCOHOLICS ANONYMOUS  [place]   Answering Service [phone] Please Note: All [place] Meetings are Non-smoking FindSpice.es  Alcohol and Drug Services -  Insurance: Medicaid /State funding/private insurance Methadone, suboxone/Intensive outpatient  [place]   [phone] Fax: [phone] [address] [place], [place], [address] [place] [phone] Fax: [phone]    [address] [place], [place], [address] ([address] [place], [place], [place], [place], [place], [place], [place], [place]) [facility] [email or web address].[email or web address]/ Accepts State funding/Medicaid Transitional housing, Intensive Outpatient Treatment, Outpatient treatment, Veterans Services  Phone: [phone] Fax: [phone] Address: [address] [place] [address]   [employer] of Care (http://[email or web address].info/) Insurance: Medicaid Case Management, Administrator, arts, Medication Management, Outpatient Therapy, Psychosocial Rehabilitation, Substance Abuse Intensive Outpatient  Phone: [phone] Fax: [phone] [address], [place], [place], [address]  [facility] Medicaid, most private insurance providers Types of Program: Individual/Group Therapy, Substance Abuse Treatment  Phone: [place] [phone] Fax: [phone] [address], [place], [place], [address] [place] [phone] [address] [place], [place], [address]  [facility]  Medicaid Types of Program: SAIOP  Phone: [phone] Fax: [phone] [address] [place], [place], [place], [address] [facility] Medicaid/state funds Crisis line [phone] [facility] [phone] [place] [phone] [address] [place] [phone]  [facility] [address];  [place], [place] [address] [phone]  Substance Abuse Intensive Outpatient Program [facility] [address] [place], [place] [address] [phone]- Substance abuse treatment  Successful Transitions  Insurance: [facility], [address], sliding scale Types of Program: substance abuse treatment, transportation assistance Phone: [phone] Fax: [phone] Address: [address], [place] [address] [facility] (TrendSwap.ch) Insurance: UHC, [place], [place], [place] Program: addiction counseling, detoxification,  Phone: [phone]  Fax: [phone] Address: [address] [place], [place] [address]  [facility] (statewide facilities/programs) [address] (Medicaid/state funds) [place], [place] [address]                      http://barrett.com/ [phone] Daniel mcalpine- [phone] [place]- [phone] [facility] [place] (2 Locations) (Medicaid/state funds) --[phone]  [address]  walk in 8:30-12 and 1-2:30 [place], [id number]   [facility]- [phone] --[address] [place], [place] [address]  [phone] walk in 8:30-12 and 2-3:30  [facility] state funds/medicaid [address] [place], [place] [address] [phone] [facility] (Suboxone clinic) Medicaid/state funds  [address] [place], [place] [address] [phone]   [facility]  [address] [place], [place] [address]  [phone] (24 hours) [place]- [address] [place], [place] [address]  [phone] (24 hours) [place]- [address] [place] [phone] [place]- [address] [place]  [phone] [place] [address] Leita [place] [phone] [facility]- Medicaid and state funds  [place]- [address] [place], [place] [address] [phone] (24 hours) [place]- [address] [place], [place] [address] [phone] [facility]- [address] [place], [place] [address] [phone] (24 hours) [place] [address] [place], [place]  [address] [phone] - [address] [place] [phone]          Outpatient Substance Use Treatment Services       [facility]    Chemical Dependence Intensive Outpatient Program   [address]   [place], [place] [address]    [phone]   Private insurance, Medicare A&B, and [facility]       ADS (Alcohol and Drug Services)    [address]    [place], [place] [address]   [phone]   Medicaid, Self Pay       [facility]        [address]    [place], [place]   [phone]   Medicaid and [facility], Self Pay       [facility]   [address]    [place], [place]    [phone]   [facility], and Self Pay      [facility] [place]        [address]      [place], [place] [address]    [phone] or [phone]   Private Insurance Only                                                 [facility]   [address]    [place], [place]  [phone]   [phone]   Medicaid, Medicare, Private Insurance      [facility] Counseling Services at the [facility]   [address]    [place], [place] [address]   [phone]   Services are free or reduced      [facility]    [address]   [phone]    Self Pay only, sliding scale      [facility]    [address]    [place], [place] [address]   [phone]   (Open Door ministry)   Self Pay, Medicaid Only        [facility]   [address], [place] [address]   [phone]   Medicaid, Medicare, Private Insurance  Adolescent Substance Use Treatment Services          [facility]   [address]    [place], [place]    [phone]   Self Pay   Offer scholarships from the [facility] to help pay for treatment    Website: [email or web address].lambert.com/      [facility]   Adolescent Substance use Program   Males ages: 21-17   Adolescent Substance use Program   Females: 12-17      [facility]   [address]  [place], [place] [address]   (ph) [phone]  (fax) [phone]      [facility]    [address]  [place], [place] [address]   (ph) [phone]  (fax) [phone]      [facility]   [address]  [place], [place] [address]   (ph) [phone]  (fax) [phone]      [facility]   [address], [place], [place] [address]   (ph) [phone]   (fax) [phone]      Website: [email or web address].[email or web address]/                           [facility]   Substance Abuse Intensive Outpatient Program for Adolescents   Phone: [phone]   Address: [address], [place], [place]   Website: [email or web address].info/         Residential Substance Use Armed forces logistics/support/administrative officer ([facility]    [address]    [place], [place] [address]    [phone] or [phone]   Detox (Medicare, Medicaid, private insurance, and self pay)    Residential Rehab 14 days (Medicare, Medicaid, private insurance, and self pay)       RTS (Residential Treatment Services)    [address] [place], [place]    [phone]    Male and Male Detox (Self Pay and Medicaid limited availability)    Rehab only Male (Medicaid and self pay only)       [facility]ellowship [address]        [address]    [place], [place] [address]    [phone] or [phone]   Detox and Residential Treatment   Private Insurance Only       [facility] Facility    [address]    [place], [place] [address]    [phone]    Treatment Only, must make assessment appointment, and must be sober for assessment appointment.    Self Pay Only, Medicare A&B, [facility], [place] ID only!   *Transportation assistance offered from [place] on [employer]       [address]   [place], [place] [address]   Walk in interviews M-Sat 8-4p   No pending legal charges   [phone]               ADATC:  [facility]   Referral    [address]   [place], [place]   [phone]   (Self Pay, [facility])      [facility]   [address]   [place], [place] [address]   [phone]   Detox and Residential Treatment   Medicare and Private Insurance      [place]   [address]  [place], [place] [address]   28 Day Women's Facility: [phone]   28 Day Men's Facility: [phone]   Long-term Residential Program:    [phone] Males 25 and Over   (No Insurance, upfront fee)      [facility]    [address]   [place], [place] [address]   [phone]   Private Insurance with [employer], Private Pay     [facility]   [address]  [place], [place] [address]  Local?[phone]   Private Insurance Only      [facility]   [address]    [place], [place] [address]    [phone]   (Males, upfront fee)      [facility]   [address]    [address] [place], [address]   [phone]   Private Insurance                [place]  [facility]  Locations      [facility]    [address]    [place], [place]    [phone]   [place] Based Program for individuals experiencing homelessness   Self Pay, No insurance      [facility]    Men's program: [facility]   [address] [place], [place] [address]   [phone]      [facility]   Women's program: [facility]   [address]  [place], [place] [address]   [phone]   Christian Based Program for individuals experiencing homelessness   Self Pay, No insurance      [facility]'s [facility]   [address] [place], [place] [address]    [phone]   [place]le[place] Based Program for individuals experiencing homelessness   Self Pay, No insurance      [facility] Women's Division   [address] [place], [place] [address]   [phone]   [place] Based Program for individuals experiencing homelessness   Self Pay, No insurance      [facility]   [address] [place], [place]   [phone]   Christian Based Program for males experiencing homelessness   Self Pay, No insurance

## 2023-12-31 NOTE — Consult Note (Signed)
 NAME:  [place], MRN:  [id number], DOB:  1975-10-08, LOS: 2 ADMISSION DATE:  12/29/2023, CONSULTATION DATE:  12/31/23 REFERRING MD:  Shyrl, CHIEF COMPLAINT:  SOB   History of Present Illness:  48 year old man w/ hx of COPD, OSA, HTN who is presenting with recurrent right pneumothorax.  After discussion, underwent VATS w R apical bleb resection, pleural abrasion.  Pre-op noted to have significant hypoxemia and WOB so left intubated post op for a more slow wean.  On arrival to unit saturating in high 80s on 100% FiO2.  PCCM to assist with vent wean.  Pertinent  Medical History  COPD OSA  Significant Hospital Events: Including procedures, antibiotic start and stop dates in addition to other pertinent events   7/12 admit 7/14 VATS, blebectomy, abrasion/pleurodesis  Interim History / Subjective:  Consult  Objective    Blood pressure 126/76, pulse 79, temperature 98.4 F (36.9 C), temperature source Oral, resp. rate 17, height 5' 10 (1.778 m), weight 69.9 kg, SpO2 93%.    Vent Mode: PRVC FiO2 (%):  [100 %] 100 % Set Rate:  [22 bmp] 22 bmp Vt Set:  [580 mL] 580 mL PEEP:  [5 cmH20-8 cmH20] 5 cmH20 Delta P (Amplitude):  [91] 91 Plateau Pressure:  [20 cmH20] 20 cmH20   Intake/Output Summary (Last 24 hours) at 12/31/2023 1633 Last data filed at 12/31/2023 1458 Gross per 24 hour  Intake 500 ml  Output 45 ml  Net 455 ml   Filed Weights   12/31/23 0912  Weight: 69.9 kg    Examination: General: thin man on vent HENT: ETT minimal secretions Lungs: severely diminished Cardiovascular: bedside US  dilated RV with thickened walls c/w cor pulmonale, LV function fine Abdomen: soft, +BS Extremities: no edema + muscle wasting Neuro: heavily sedated GU: foley to be placed  Imaging and labs reviewed  Resolved problem list   Assessment and Plan  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  Has had breathing trouble for multiple years leading to  admission so baseline pulmonary status fragile at best.  Now s/p VATs with some expected de-recruitment and lung injury. - Chest tube activity per Dr. Shyrl - Heavily sedate tonight while we work on improving FiO2, I did add veletri to give us  some wiggle room and not have to crank up PEEP - Hopefully can wean in AM, pain control will be an issue per report - Check LE duplex for completeness sake  Best Practice (right click and Reselect all SmartList Selections daily)   Diet/type: NPO DVT prophylaxis SCD Pressure ulcer(s): N/A GI prophylaxis: PPI Lines: N/A Foley:  Yes, and it is still needed Code Status:  full code Last date of multidisciplinary goals of care discussion [family updated at bedside]  Labs   CBC: Recent Labs  Lab 12/29/23 1659 12/30/23 0856 12/30/23 2205  WBC 12.0* 9.6 10.2  HGB 13.8 13.3 11.7*  HCT 40.3 39.2 34.6*  MCV 90.6 92.5 90.6  PLT 335 256 223    Basic Metabolic Panel: Recent Labs  Lab 12/29/23 1659 12/30/23 0856 12/30/23 2205  NA 137 134* 137  K 4.2 4.1 4.4  CL 104 102 104  CO2 30 26 27   GLUCOSE 120* 102* 97  BUN 24* 14 15  CREATININE 0.94 0.72 0.76  CALCIUM 9.1 8.5* 8.5*   GFR: Estimated Creatinine Clearance: 110.4 mL/min (by C-G formula based on SCr of 0.76 mg/dL). Recent Labs  Lab 12/29/23 1659 12/30/23 0856 12/30/23 2205  WBC 12.0* 9.6  10.2    Liver Function Tests: Recent Labs  Lab 12/30/23 2205  AST 12*  ALT 12  ALKPHOS 75  BILITOT 0.5  PROT 5.7*  ALBUMIN  2.9*   No results for input(s): LIPASE, AMYLASE in the last 168 hours. No results for input(s): AMMONIA in the last 168 hours.  ABG No results found for: PHART, PCO2ART, PO2ART, HCO3, TCO2, ACIDBASEDEF, O2SAT   Coagulation Profile: Recent Labs  Lab 12/30/23 2205  INR 1.0    Cardiac Enzymes: No results for input(s): CKTOTAL, CKMB, CKMBINDEX, TROPONINI in the last 168 hours.  HbA1C: Hgb A1c MFr Bld  Date/Time Value Ref  Range Status  06/06/2018 07:29 PM 5.4 4.8 - 5.6 % Final    Comment:             Prediabetes: 5.7 - 6.4          Diabetes: >6.4          Glycemic control for adults with diabetes: <7.0     CBG: No results for input(s): GLUCAP in the last 168 hours.  Review of Systems:   Intubated/sedated  Past Medical History:  He,  has a past medical history of Asthma, COPD (chronic obstructive pulmonary disease) (HCC), GERD (gastroesophageal reflux disease), Gout, Hypertension, Sleep apnea, and Umbilical hernia without obstruction and without gangrene.   Surgical History:   Past Surgical History:  Procedure Laterality Date   CYST EXCISION     Patient states he was 48 years old, on his back   LUMBAR LAMINECTOMY/DECOMPRESSION MICRODISCECTOMY Left 09/16/2021   Procedure: LEFT L5-S1 MICRODISCECTOMY;  Surgeon: Clois Fret, MD;  Location: [facility] ORS;  Service: Neurosurgery;  Laterality: Left;   UMBILICAL HERNIA REPAIR N/A 07/26/2018   Procedure: OPEN HERNIA REPAIR UMBILICAL ADULT WITH MESH;  Surgeon: Nicholaus Selinda Birmingham, MD;  Location: [facility] ORS;  Service: General;  Laterality: N/A;   XI ROBOTIC ASSISTED INGUINAL HERNIA REPAIR WITH MESH Bilateral 01/31/2022   Procedure: XI ROBOTIC ASSISTED INGUINAL HERNIA REPAIR WITH MESH;  Surgeon: Desiderio Schanz, MD;  Location: [facility] ORS;  Service: General;  Laterality: Bilateral;     Social History:   reports that he has been smoking cigarettes. He has a 60 pack-year smoking history. He has been exposed to tobacco smoke. His smokeless tobacco use includes chew. He reports that he does not currently use alcohol. He reports current drug use. Drug: Marijuana.   Family History:  His family history includes Asthma in his son; Lung cancer in his father. There is no history of Colon cancer, Esophageal cancer, Rectal cancer, or Stomach cancer.   Allergies No Known Allergies   Home Medications  Prior to Admission medications   Medication Sig Start Date End Date Taking?  Authorizing Provider  albuterol  (PROVENTIL ) (2.5 MG/3ML) 0.083% nebulizer solution Take 2.5 mg by nebulization every 6 (six) hours as needed for shortness of breath or wheezing.   Yes [provider]  allopurinol  (ZYLOPRIM ) 300 MG tablet Take 1 tablet (300 mg total) by mouth daily. 12/24/23  Yes Lorren, Amy J, NP  gabapentin  (NEURONTIN ) 300 MG capsule Take 1 capsule (300 mg total) by mouth daily. 12/24/23  Yes Lorren, Amy J, NP  lisinopril  (ZESTRIL ) 20 MG tablet Take 1 tablet (20 mg total) by mouth daily. 12/24/23  Yes Lorren, Amy J, NP  omeprazole  (PRILOSEC) 20 MG capsule Take 1 capsule (20 mg total) by mouth daily. 12/24/23 03/23/24 Yes Lorren, Amy J, NP  sildenafil  (VIAGRA ) 100 MG tablet Take 1 tablet 1/2 hour to 1 hour prior  to intercourse as needed. Limit use to 1/2 tablet or 1 tablet per 24 hours. 12/24/23  Yes Lorren, Amy J, NP  traZODone  (DESYREL ) 50 MG tablet Take 0.5-1 tablets (25-50 mg total) by mouth at bedtime as needed for sleep. 12/24/23  Yes Lorren Greig PARAS, NP  VENTOLIN  HFA 108 (90 Base) MCG/ACT inhaler Inhale 2 puffs into the lungs See admin instructions. Every 4-6 hours prn 12/24/23  Yes Lorren, Amy J, NP  acetaminophen  (TYLENOL ) 500 MG tablet Take 1 tablet (500 mg total) by mouth every 6 (six) hours as needed for mild pain (pain score 1-3) or moderate pain (pain score 4-6). Patient not taking: Reported on 12/29/2023 12/22/23   Singh, Prashant K, MD  ibuprofen  (ADVIL ) 600 MG tablet Take 1 tablet (600 mg total) by mouth every 8 (eight) hours as needed for moderate pain (pain score 4-6). 12/22/23   Dennise Lavada POUR, MD     Critical care time: 31 mins

## 2023-12-31 NOTE — Progress Notes (Signed)
 Report called to short stay. Nurse verbalized understanding of report. Consent signed and in chart. CHG wipes complete. Pt NPO since MN

## 2023-12-31 NOTE — Progress Notes (Signed)
     [address]       [place] [address]             [phone]       No events Vitals:   12/31/23 0734 12/31/23 0912  BP: 114/68 126/76  Pulse: 83 79  Resp: 16 17  Temp: 97.9 F (36.6 C) 98.4 F (36.9 C)  SpO2: 90% 91%   Alert NAD Sinus Increase WOB  OR today for L RATS, wedge resection, apical pleurectomy, mechanical pleurodesis  Tanyah Debruyne O Ashtan Girtman

## 2023-12-31 NOTE — Progress Notes (Signed)
 Pt transported to procedure via bed by transportation staff

## 2023-12-31 NOTE — Anesthesia Preprocedure Evaluation (Signed)
 Anesthesia Evaluation  Patient identified by MRN, date of birth, ID band Patient awake    Reviewed: Allergy & Precautions, H&P , NPO status , Patient's Chart, lab work & pertinent test results  Airway Mallampati: II   Neck ROM: full    Dental   Pulmonary asthma , sleep apnea , COPD, Current Smoker and Patient abstained from smoking.   breath sounds clear to auscultation       Cardiovascular hypertension,  Rhythm:regular Rate:Normal     Neuro/Psych    GI/Hepatic ,GERD  ,,  Endo/Other    Renal/GU      Musculoskeletal  (+) Arthritis ,    Abdominal   Peds  Hematology   Anesthesia Other Findings   Reproductive/Obstetrics                              Anesthesia Physical Anesthesia Plan  ASA: 3  Anesthesia Plan: General   Post-op Pain Management:    Induction: Intravenous  PONV Risk Score and Plan: 1 and Ondansetron , Dexamethasone , Midazolam  and Treatment may vary due to age or medical condition  Airway Management Planned: Double Lumen EBT  Additional Equipment: Arterial line  Intra-op Plan:   Post-operative Plan: Extubation in OR  Informed Consent: I have reviewed the patients History and Physical, chart, labs and discussed the procedure including the risks, benefits and alternatives for the proposed anesthesia with the patient or authorized representative who has indicated his/her understanding and acceptance.     Dental advisory given  Plan Discussed with: CRNA, Anesthesiologist and Surgeon  Anesthesia Plan Comments:         Anesthesia Quick Evaluation

## 2023-12-31 NOTE — Transfer of Care (Signed)
 Immediate Anesthesia Transfer of Care Note  Patient: Albert Hill  Procedure(s) Performed: ROBOTIC ASSISTED RIGHT VIDEO ASSISTED THORACOSCOPIC SURGERY, BLEB RESECTION, PLEURAL ABRASION (Right: Chest) BLOCK, NERVE, INTERCOSTAL (Right: Chest)  Patient Location: PACU  Anesthesia Type:General  Level of Consciousness: Patient remains intubated per anesthesia plan  Airway & Oxygen  Therapy: Patient remains intubated per anesthesia plan  Post-op Assessment: Report given to RN and Post -op Vital signs reviewed and stable  Post vital signs: Reviewed and stable  Last Vitals:  Vitals Value Taken Time  BP 125/85   Temp 98   Pulse 89 12/31/23 15:07  Resp 22 12/31/23 15:07  SpO2 89 % 12/31/23 15:07  Vitals shown include unfiled device data.  Last Pain:  Vitals:   12/31/23 1016  TempSrc:   PainSc: 10-Worst pain ever         Complications: No notable events documented.

## 2024-01-01 ENCOUNTER — Inpatient Hospital Stay (HOSPITAL_COMMUNITY)

## 2024-01-01 ENCOUNTER — Telehealth: Payer: Self-pay

## 2024-01-01 ENCOUNTER — Encounter (HOSPITAL_COMMUNITY): Payer: Self-pay | Admitting: Thoracic Surgery (Cardiothoracic Vascular Surgery)

## 2024-01-01 ENCOUNTER — Other Ambulatory Visit

## 2024-01-01 DIAGNOSIS — R609 Edema, unspecified: Secondary | ICD-10-CM

## 2024-01-01 DIAGNOSIS — J9383 Other pneumothorax: Secondary | ICD-10-CM | POA: Diagnosis not present

## 2024-01-01 DIAGNOSIS — Z9911 Dependence on respirator [ventilator] status: Secondary | ICD-10-CM

## 2024-01-01 LAB — POCT I-STAT 7, (LYTES, BLD GAS, ICA,H+H)
Acid-Base Excess: 1 mmol/L (ref 0.0–2.0)
Acid-Base Excess: 2 mmol/L (ref 0.0–2.0)
Bicarbonate: 25 mmol/L (ref 20.0–28.0)
Bicarbonate: 26.7 mmol/L (ref 20.0–28.0)
Calcium, Ion: 1.18 mmol/L (ref 1.15–1.40)
Calcium, Ion: 1.18 mmol/L (ref 1.15–1.40)
HCT: 32 % — ABNORMAL LOW (ref 39.0–52.0)
HCT: 34 % — ABNORMAL LOW (ref 39.0–52.0)
Hemoglobin: 10.9 g/dL — ABNORMAL LOW (ref 13.0–17.0)
Hemoglobin: 11.6 g/dL — ABNORMAL LOW (ref 13.0–17.0)
O2 Saturation: 100 %
O2 Saturation: 100 %
Patient temperature: 36.6
Patient temperature: 37
Potassium: 4.1 mmol/L (ref 3.5–5.1)
Potassium: 3.7 mmol/L (ref 3.5–5.1)
Sodium: 138 mmol/L (ref 135–145)
Sodium: 138 mmol/L (ref 135–145)
TCO2: 26 mmol/L (ref 22–32)
TCO2: 28 mmol/L (ref 22–32)
pCO2 arterial: 34.2 mmHg (ref 32–48)
pCO2 arterial: 42.9 mmHg (ref 32–48)
pH, Arterial: 7.469 — ABNORMAL HIGH (ref 7.35–7.45)
pH, Arterial: 7.403 (ref 7.35–7.45)
pO2, Arterial: 286 mmHg — ABNORMAL HIGH (ref 83–108)
pO2, Arterial: 232 mmHg — ABNORMAL HIGH (ref 83–108)

## 2024-01-01 LAB — RAPID URINE DRUG SCREEN, HOSP PERFORMED
Amphetamines: POSITIVE — AB
Barbiturates: NOT DETECTED
Benzodiazepines: POSITIVE — AB
Cocaine: POSITIVE — AB
Opiates: POSITIVE — AB
Tetrahydrocannabinol: POSITIVE — AB

## 2024-01-01 LAB — CBC
HCT: 33.5 % — ABNORMAL LOW (ref 39.0–52.0)
Hemoglobin: 11.8 g/dL — ABNORMAL LOW (ref 13.0–17.0)
MCH: 31.5 pg (ref 26.0–34.0)
MCHC: 35.2 g/dL (ref 30.0–36.0)
MCV: 89.3 fL (ref 80.0–100.0)
Platelets: 285 K/uL (ref 150–400)
RBC: 3.75 MIL/uL — ABNORMAL LOW (ref 4.22–5.81)
RDW: 12.8 % (ref 11.5–15.5)
WBC: 17.5 K/uL — ABNORMAL HIGH (ref 4.0–10.5)
nRBC: 0 % (ref 0.0–0.2)

## 2024-01-01 LAB — BASIC METABOLIC PANEL WITH GFR
Anion gap: 9 (ref 5–15)
BUN: 20 mg/dL (ref 6–20)
CO2: 26 mmol/L (ref 22–32)
Calcium: 8.8 mg/dL — ABNORMAL LOW (ref 8.9–10.3)
Chloride: 102 mmol/L (ref 98–111)
Creatinine, Ser: 0.82 mg/dL (ref 0.61–1.24)
GFR, Estimated: >60 mL/min (ref >60)
Glucose, Bld: 121 mg/dL — ABNORMAL HIGH (ref 70–99)
Potassium: 4.2 mmol/L (ref 3.5–5.1)
Sodium: 137 mmol/L (ref 135–145)

## 2024-01-01 LAB — MAGNESIUM: Magnesium: 2.1 mg/dL (ref 1.7–2.4)

## 2024-01-01 LAB — TRIGLYCERIDES: Triglycerides: 118 mg/dL (ref <150)

## 2024-01-01 LAB — PHOSPHORUS: Phosphorus: 3.2 mg/dL (ref 2.5–4.6)

## 2024-01-01 LAB — GLUCOSE, CAPILLARY
Glucose-Capillary: 104 mg/dL — ABNORMAL HIGH (ref 70–99)
Glucose-Capillary: 108 mg/dL — ABNORMAL HIGH (ref 70–99)
Glucose-Capillary: 96 mg/dL (ref 70–99)

## 2024-01-01 MED ORDER — PIPERACILLIN-TAZOBACTAM 3.375 G IVPB
3.3750 g | Freq: Three times a day (TID) | INTRAVENOUS | Status: AC
  Administered 2024-01-01 – 2024-01-06 (×15): 3.375 g via INTRAVENOUS
  Filled 2024-01-01 (×15): qty 50

## 2024-01-01 MED ORDER — VANCOMYCIN HCL 1500 MG/300ML IV SOLN
1500.0000 mg | Freq: Two times a day (BID) | INTRAVENOUS | Status: AC
  Administered 2024-01-01 – 2024-01-05 (×10): 1500 mg via INTRAVENOUS
  Filled 2024-01-01 (×11): qty 300

## 2024-01-01 MED ORDER — PIPERACILLIN-TAZOBACTAM 3.375 G IVPB 30 MIN
3.3750 g | Freq: Once | INTRAVENOUS | Status: AC
  Administered 2024-01-01: 3.375 g via INTRAVENOUS
  Filled 2024-01-01: qty 50

## 2024-01-01 MED ORDER — VITAL 1.5 CAL PO LIQD: 1000.0000 mL | ORAL | Status: DC

## 2024-01-01 MED ORDER — VANCOMYCIN HCL 1500 MG/300ML IV SOLN
1500.0000 mg | Freq: Two times a day (BID) | INTRAVENOUS | Status: DC
  Filled 2024-01-01: qty 300

## 2024-01-01 MED ORDER — ENOXAPARIN SODIUM 40 MG/0.4ML IJ SOSY
40.0000 mg | PREFILLED_SYRINGE | Freq: Every day | INTRAMUSCULAR | Status: DC
  Administered 2024-01-01 – 2024-01-14 (×14): 40 mg via SUBCUTANEOUS
  Filled 2024-01-01 (×14): qty 0.4

## 2024-01-01 MED ORDER — ALBUMIN HUMAN 5 % IV SOLN
INTRAVENOUS | Status: AC
  Filled 2024-01-01: qty 250

## 2024-01-01 MED ORDER — PROSOURCE TF20 ENFIT COMPATIBL EN LIQD
60.0000 mL | Freq: Every day | ENTERAL | Status: DC
  Administered 2024-01-01: 60 mL
  Filled 2024-01-01: qty 60

## 2024-01-01 MED ORDER — ARFORMOTEROL TARTRATE 15 MCG/2ML IN NEBU
15.0000 ug | INHALATION_SOLUTION | Freq: Two times a day (BID) | RESPIRATORY_TRACT | Status: DC
  Administered 2024-01-01 – 2024-01-15 (×25): 15 ug via RESPIRATORY_TRACT
  Filled 2024-01-01 (×29): qty 2

## 2024-01-01 MED ORDER — THIAMINE MONONITRATE 100 MG PO TABS
100.0000 mg | ORAL_TABLET | Freq: Every day | ORAL | Status: DC
  Administered 2024-01-01: 100 mg
  Filled 2024-01-01: qty 1

## 2024-01-01 MED ORDER — REVEFENACIN 175 MCG/3ML IN SOLN
175.0000 ug | Freq: Every day | RESPIRATORY_TRACT | Status: DC
  Administered 2024-01-03 – 2024-01-15 (×12): 175 ug via RESPIRATORY_TRACT
  Filled 2024-01-01 (×16): qty 3

## 2024-01-01 NOTE — Progress Notes (Signed)
 Venous duplex lower ext  has been completed. Refer to [facility] under chart review to view preliminary results.   01/01/2024  11:16 AM Japji Kok, Ricka BIRCH

## 2024-01-01 NOTE — Progress Notes (Signed)
 Initial Nutrition Assessment  DOCUMENTATION CODES:   Severe malnutrition in context of acute illness/injury  INTERVENTION:  Initiate trickle TF via OGT: Vital 1.5 at 70ml/hr  Once tolerance established and able to advance to goal, recommend: Increasing Vital 1.5 by 10ml q8h to goal rate of 35ml/hr ( per day) 60ml ProSource TF20 once daily Provides 2060 kcal, 19g protein and free water daily  Monitor magnesium  and phosphorus daily x 4 occurrences, MD to replete as needed, as pt is at risk for refeeding syndrome given severe acute malnutrition.  Thiamine  100mg  daily x5 days  NUTRITION DIAGNOSIS:   Severe Malnutrition related to acute illness (recurrent pneumothorax) as evidenced by moderate fat depletion, moderate muscle depletion, severe muscle depletion  GOAL:   Patient will meet greater than or equal to 90% of their needs  MONITOR:   Vent status, Labs, Weight trends, TF tolerance  REASON FOR ASSESSMENT:   Ventilator    ASSESSMENT:   Pt admitted with sudden onset of SOB, found to have pneumothorax on admission. PMH significant for pneumothroax 3 weeks ago, HTN and tobacco abuse.  7/14: intubated; s/p VATS, blebectomy  Patient is currently intubated on ventilator support MV: 16.2 L/min Temp (24hrs), Avg:98.1 F (36.7 C), Min:97.5 F (36.4 C), Max:99.7 F (37.6 C) MAP (a-line): 83<-67<-97<-79<-77<-62  Noted plan for vent wean once able to wean off nitric to 50% O2.  Discussed nutrition with CCM. Amenable to initiation of trickles as pt will not likely wean/extubate today.   Given intubation, unable to obtain detailed nutrition related history from patient. No family/visitors present at bedside. Pt intermittently waking up and looking at RD during assessment. Made RN aware.   No weight history on file to review within the last year.  Current documented weight notably 79.1 kg. Will continue to monitor throughout admission.   Drains/lines: OGT (tip  in proximal stomach)- x24 hours R chest tube- x24 hours  Medications: dolcolax, colace, miralax , senna BID Drips: Neo @ 30mcg/min Propofol  @ 27.87ml/hr  Labs: reviewed  NUTRITION - FOCUSED PHYSICAL EXAM: Given pt with moderate to severe muscle and subcutaneous fat deficits, he meets criteria for malnutrition. Suspect this to be more chronic in nature given severity of deficits however this is likely exacerbated by acute recurrent pneumothorax. Will re-assess as appropriate once extubated and able to obtain more detailed nutrition/weight history.  Flowsheet Row Most Recent Value  Orbital Region Severe depletion  Upper Arm Region Moderate depletion  Thoracic and Lumbar Region Moderate depletion  Buccal Region Unable to assess  Temple Region Severe depletion  Clavicle Bone Region Severe depletion  Clavicle and Acromion Bone Region Moderate depletion  Scapular Bone Region Unable to assess  Dorsal Hand Unable to assess  [in handmits]  Patellar Region Moderate depletion  Anterior Thigh Region Moderate depletion  Posterior Calf Region Unable to assess  [prevalon boots]  Edema (RD Assessment) Mild  Hair Reviewed  Eyes Reviewed  Mouth Unable to assess  Skin Reviewed  Nails Unable to assess    Diet Order:   Diet Order     None       EDUCATION NEEDS:   No education needs have been identified at this time  Skin:  Skin Assessment: Reviewed RN Assessment  Last BM:  unknown/PTA; abdomen soft  Height:   Ht Readings from Last 1 Encounters:  01/01/24 5' 10 (1.778 m)    Weight:   Wt Readings from Last 1 Encounters:  01/01/24 79.1 kg   BMI:  Body mass index  is 25.02 kg/m.  Estimated Nutritional Needs:   Kcal:  2000-2200  Protein:  105-120g  Fluid:  >/=2L  Royce Maris, RDN, LDN Clinical Nutrition See AMiON for contact information.

## 2024-01-01 NOTE — Progress Notes (Signed)
 RT weaning NO2 per protocol. Patient is waking up intermittantly and nodding appropriately to instructions but raising his handsup towards ETT despite mittens, fentanyl  boluses. Soft bilateral wrist restraints applied to prevent self-extubation.

## 2024-01-01 NOTE — Progress Notes (Signed)
      [address]       [place] [address]             [phone]      POD # 1  Intubated, sedated  BP 94/63   Pulse (!) 53   Temp 98.2 F (36.8 C)   Resp (!) 28   Ht 5' 10 (1.778 m)   Wt 79.1 kg   SpO2 100%   BMI 25.02 kg/m  PRVC 28, 100%, 5 PEEP   Intake/Output Summary (Last 24 hours) at 01/01/2024 1824 Last data filed at 01/01/2024 1500 Gross per 24 hour  Intake 2187.06 ml  Output 1555 ml  Net 632.06 ml   Off NO Repeat ABG pending  Aubrionna Istre C. Kerrin, MD [facility] [phone]

## 2024-01-01 NOTE — Progress Notes (Signed)
 [address]       [place] [address]             [phone]                 1 Day Post-Op Procedure(s) (LRB): ROBOTIC ASSISTED RIGHT VIDEO ASSISTED THORACOSCOPIC SURGERY, BLEB RESECTION, PLEURAL ABRASION (Right) BLOCK, NERVE, INTERCOSTAL (Right)   Events: No events Remains intubated _______________________________________________________________ Vitals: BP (!) 142/97   Pulse (!) 52   Temp 97.7 F (36.5 C)   Resp (!) 28   Ht 5' 10 (1.778 m)   Wt 79.1 kg   SpO2 100%   BMI 25.02 kg/m  Filed Weights   12/31/23 0912 01/01/24 0500  Weight: 69.9 kg 79.1 kg     - Neuro: sedated  - Cardiovascular: sinus  Drips: neo 50.      - Pulm: on iNO Vent Mode: PRVC FiO2 (%):  [100 %] 100 % Set Rate:  [22 bmp-28 bmp] 28 bmp Vt Set:  [580 mL] 580 mL PEEP:  [5 cmH20-8 cmH20] 5 cmH20 Delta P (Amplitude):  [91] 91 Plateau Pressure:  [20 cmH20-21 cmH20] 20 cmH20  ABG    Component Value Date/Time   PHART 7.469 (H) 01/01/2024 0538   PCO2ART 34.2 01/01/2024 0538   PO2ART 286 (H) 01/01/2024 0538   HCO3 25.0 01/01/2024 0538   TCO2 26 01/01/2024 0538   ACIDBASEDEF 1.0 12/31/2023 1540   O2SAT 100 01/01/2024 0538    - Abd: ND - Extremity: warm  .Intake/Output      07/14 0701 07/15 0700 07/15 0701 07/16 0700   P.O.     I.V. (mL/kg) 1410.7 (17.8) 92.2 (1.2)   NG/GT 250    IV Piggyback 200.1 50   Total Intake(mL/kg) 1860.8 (23.5) 142.2 (1.8)   Urine (mL/kg/hr) 645 (0.3) 60 (0.2)   Emesis/NG output 550    Blood 25    Chest Tube 330    Total Output 1550 60   Net +310.8 +82.2           _______________________________________________________________ Labs:    Latest Ref Rng & Units 01/01/2024    5:38 AM 01/01/2024    5:23 AM 12/31/2023    5:11 PM  CBC  WBC 4.0 - 10.5 K/uL  17.5    Hemoglobin 13.0 - 17.0 g/dL 89.0  88.1  88.7   Hematocrit 39.0 - 52.0 % 32.0  33.5  33.0   Platelets 150 - 400 K/uL  285        Latest Ref Rng & Units 01/01/2024     5:38 AM 01/01/2024    5:23 AM 12/31/2023    5:11 PM  CMP  Glucose 70 - 99 mg/dL  878    BUN 6 - 20 mg/dL  20    Creatinine 9.38 - 1.24 mg/dL  9.17    Sodium 864 - 854 mmol/L 138  137  138   Potassium 3.5 - 5.1 mmol/L 4.1  4.2  4.8   Chloride 98 - 111 mmol/L  102    CO2 22 - 32 mmol/L  26    Calcium 8.9 - 10.3 mg/dL  8.8      CXR: No pneumoT  _______________________________________________________________  Assessment and Plan: POD 1 s/p R RATS wedge resection  Neuro: sedated while on vent CV: stable Pulm: weaning iNO Renal: creat stable GI: NPO Heme: stable ID: pan culture, broad spectrum abx Endo: SSI  Dispo: continue ICU care.   [place]  01/01/2024 10:33 AM

## 2024-01-01 NOTE — Progress Notes (Signed)
 Nitric weaned off at this time. ABG be be checked hour after.

## 2024-01-01 NOTE — Progress Notes (Signed)
   Telephone encounter was:  Unsuccessful.  01/01/2024 Name: Albert Hill MRN: [id number] DOB: 07-15-1975  Unsuccessful outbound call made today to assist with:  Food Insecurity and Financial Difficulties related to financial strain  Outreach Attempt:  3rd Attempt.  Referral closed unable to contact patient. No answer, No voicemail   Jon Colt [facility] Guide, Phone: [phone] Fax: [phone] Website: [email or web address]

## 2024-01-01 NOTE — Progress Notes (Signed)
   NAME:  Albert [place], MRN:  [id number], DOB:  24-Feb-1976, LOS: 3 ADMISSION DATE:  12/29/2023, CONSULTATION DATE:  12/31/23 REFERRING MD:  Shyrl, CHIEF COMPLAINT:  SOB   History of Present Illness:  48 year old man w/ hx of COPD, OSA, HTN who is presenting with recurrent right pneumothorax.  After discussion, underwent VATS w R apical bleb resection, pleural abrasion.  Pre-op noted to have significant hypoxemia and WOB so left intubated post op for a more slow wean.  On arrival to unit saturating in high 80s on 100% FiO2.  PCCM to assist with vent wean.  Pertinent  Medical History  COPD OSA  Significant Hospital Events: Including procedures, antibiotic start and stop dates in addition to other pertinent events   7/12 admit 7/14 VATS, blebectomy, abrasion/pleurodesis  Interim History / Subjective:  No events, settled out on nitric.  Objective    Blood pressure 114/80, pulse 63, temperature 98.2 F (36.8 C), resp. rate (!) 28, height 5' 10 (1.778 m), weight 79.1 kg, SpO2 100%.    Vent Mode: PRVC FiO2 (%):  [100 %] 100 % Set Rate:  [22 bmp-28 bmp] 28 bmp Vt Set:  [580 mL] 580 mL PEEP:  [5 cmH20-8 cmH20] 5 cmH20 Delta P (Amplitude):  [91] 91 Plateau Pressure:  [20 cmH20-21 cmH20] 21 cmH20   Intake/Output Summary (Last 24 hours) at 01/01/2024 9276 Last data filed at 01/01/2024 0700 Gross per 24 hour  Intake 1860.76 ml  Output 1550 ml  Net 310.76 ml   Filed Weights   12/31/23 0912 01/01/24 0500  Weight: 69.9 kg 79.1 kg    Examination: No distress Squeak on R lung, L more clear Passive on vent Chest tube with 1+ airleak More apical former chest tube site with some purulence now dressed  Oxygenation better BMP ok WBC up  Resolved problem list   Assessment and Plan  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  Has had breathing trouble for multiple years leading to admission so baseline pulmonary status fragile at best.  Now s/p VATs  with some expected de-recruitment and lung injury. - Chest tube activity per Dr. Shyrl - f/u duplex - Brovana  yupelri  - Empiric vanc/zosyn  x 5 days - Wean nitric then O2, once 50% or less can vent wean discussed with RT - Suspect has needed home O2 for some time and will need to be a little liberal with O2 goals  31 min cc time Rolan Sharps MD PCCM

## 2024-01-01 NOTE — Plan of Care (Signed)

## 2024-01-01 NOTE — Progress Notes (Signed)
 Pharmacy Antibiotic Note  MONTRE HARBOR is a 48 y.o. male with recurrent pneumothorax s/p VATs.  Pharmacy has been consulted for zosyn  and vancomycin  dosing for possible aspiration PNA    Plan: -zosyn  3,375gm IV q8h -vancomycin  1500mg  IV q12h; estimated AUC 526 -Will follow renal function, cultures and clinical progress   Height: 5' 10 (177.8 cm) Weight: 79.1 kg (174 lb 6.1 oz) IBW/kg (Calculated) : 73  Temp (24hrs), Avg:98.1 F (36.7 C), Min:97.7 F (36.5 C), Max:99.7 F (37.6 C)  Recent Labs  Lab 12/29/23 1659 12/30/23 0856 12/30/23 2205 01/01/24 0523  WBC 12.0* 9.6 10.2 17.5*  CREATININE 0.94 0.72 0.76 0.82    Estimated Creatinine Clearance: 112.5 mL/min (by C-G formula based on SCr of 0.82 mg/dL).    No Known Allergies  Antimicrobials this admission: 7/15 zosyn  7/15 vanc  Dose adjustments this admission:   Microbiology results: 7/15: MRSA PCR + for staph  Thank you for allowing pharmacy to be a part of this patient's care.  Prentice Poisson, PharmD Clinical Pharmacist **Pharmacist phone directory can now be found on [email or web address] ([email or web address]).  Listed under [facility].

## 2024-01-02 DIAGNOSIS — J9383 Other pneumothorax: Secondary | ICD-10-CM | POA: Diagnosis not present

## 2024-01-02 LAB — GLUCOSE, CAPILLARY
Glucose-Capillary: 101 mg/dL — ABNORMAL HIGH (ref 70–99)
Glucose-Capillary: 104 mg/dL — ABNORMAL HIGH (ref 70–99)
Glucose-Capillary: 138 mg/dL — ABNORMAL HIGH (ref 70–99)
Glucose-Capillary: 86 mg/dL (ref 70–99)
Glucose-Capillary: 87 mg/dL (ref 70–99)
Glucose-Capillary: 89 mg/dL (ref 70–99)

## 2024-01-02 LAB — COMPREHENSIVE METABOLIC PANEL WITH GFR
ALT: 34 U/L (ref 0–44)
AST: 45 U/L — ABNORMAL HIGH (ref 15–41)
Albumin: 2.3 g/dL — ABNORMAL LOW (ref 3.5–5.0)
Alkaline Phosphatase: 78 U/L (ref 38–126)
Anion gap: 13 (ref 5–15)
BUN: 21 mg/dL — ABNORMAL HIGH (ref 6–20)
CO2: 24 mmol/L (ref 22–32)
Calcium: 8.4 mg/dL — ABNORMAL LOW (ref 8.9–10.3)
Chloride: 102 mmol/L (ref 98–111)
Creatinine, Ser: 0.71 mg/dL (ref 0.61–1.24)
GFR, Estimated: >60 mL/min (ref >60)
Glucose, Bld: 97 mg/dL (ref 70–99)
Potassium: 3.8 mmol/L (ref 3.5–5.1)
Sodium: 139 mmol/L (ref 135–145)
Total Bilirubin: 0.7 mg/dL (ref 0.0–1.2)
Total Protein: 5.4 g/dL — ABNORMAL LOW (ref 6.5–8.1)

## 2024-01-02 LAB — POCT I-STAT 7, (LYTES, BLD GAS, ICA,H+H)
Acid-Base Excess: 1 mmol/L (ref 0.0–2.0)
Bicarbonate: 24.9 mmol/L (ref 20.0–28.0)
Calcium, Ion: 1.17 mmol/L (ref 1.15–1.40)
HCT: 32 % — ABNORMAL LOW (ref 39.0–52.0)
Hemoglobin: 10.9 g/dL — ABNORMAL LOW (ref 13.0–17.0)
O2 Saturation: 97 %
Patient temperature: 37.3
Potassium: 3.8 mmol/L (ref 3.5–5.1)
Sodium: 139 mmol/L (ref 135–145)
TCO2: 26 mmol/L (ref 22–32)
pCO2 arterial: 36.2 mmHg (ref 32–48)
pH, Arterial: 7.447 (ref 7.35–7.45)
pO2, Arterial: 84 mmHg (ref 83–108)

## 2024-01-02 LAB — PHOSPHORUS: Phosphorus: 2.9 mg/dL (ref 2.5–4.6)

## 2024-01-02 LAB — CBC
HCT: 33.2 % — ABNORMAL LOW (ref 39.0–52.0)
Hemoglobin: 11.5 g/dL — ABNORMAL LOW (ref 13.0–17.0)
MCH: 31.3 pg (ref 26.0–34.0)
MCHC: 34.6 g/dL (ref 30.0–36.0)
MCV: 90.5 fL (ref 80.0–100.0)
Platelets: 310 K/uL (ref 150–400)
RBC: 3.67 MIL/uL — ABNORMAL LOW (ref 4.22–5.81)
RDW: 12.7 % (ref 11.5–15.5)
WBC: 14.2 K/uL — ABNORMAL HIGH (ref 4.0–10.5)
nRBC: 0 % (ref 0.0–0.2)

## 2024-01-02 LAB — MAGNESIUM: Magnesium: 2.1 mg/dL (ref 1.7–2.4)

## 2024-01-02 LAB — SURGICAL PATHOLOGY

## 2024-01-02 MED ORDER — METHOCARBAMOL 500 MG PO TABS
500.0000 mg | ORAL_TABLET | Freq: Three times a day (TID) | ORAL | Status: DC
  Administered 2024-01-02 (×2): 500 mg via ORAL
  Filled 2024-01-02 (×2): qty 1

## 2024-01-02 MED ORDER — ORAL CARE MOUTH RINSE: 15.0000 mL | OROMUCOSAL | Status: DC | PRN

## 2024-01-02 MED ORDER — THIAMINE MONONITRATE 100 MG PO TABS
100.0000 mg | ORAL_TABLET | Freq: Every day | ORAL | Status: AC
  Administered 2024-01-02 – 2024-01-05 (×4): 100 mg via ORAL
  Filled 2024-01-02 (×4): qty 1

## 2024-01-02 MED ORDER — NICOTINE 21 MG/24HR TD PT24
21.0000 mg | MEDICATED_PATCH | Freq: Every day | TRANSDERMAL | Status: DC
  Administered 2024-01-02 – 2024-01-15 (×14): 21 mg via TRANSDERMAL
  Filled 2024-01-02 (×14): qty 1

## 2024-01-02 MED ORDER — TEMAZEPAM 15 MG PO CAPS
30.0000 mg | ORAL_CAPSULE | Freq: Every evening | ORAL | Status: DC | PRN
  Administered 2024-01-03 – 2024-01-14 (×6): 30 mg via ORAL
  Filled 2024-01-02 (×6): qty 2

## 2024-01-02 NOTE — Evaluation (Signed)
 Physical Therapy Evaluation Patient Details Name: Albert Hill MRN: [id number] DOB: 1975/11/07 Today's Date: 01/02/2024  History of Present Illness  48 year old man admitted 7/14  presenting with recurrent right pneumothorax.  After discussion, underwent VATS w R apical bleb resection, pleural abrasion.  Pre-op noted to have significant hypoxemia and WOB so left intubated post op for a more slow wean.  VDRF 7/14-7/16.  PMH: COPD, OSA, HTN  Clinical Impression  Pt admitted with above diagnosis. Pt was able to stand at EOB with mod assist of 2 for power up and then min assist of 2 for power up second attempt. Stood for up to 30 seconds second time. Pt with poor endurance and painful today. Will progress as pt tolerates.  Pt currently with functional limitations due to the deficits listed below (see PT Problem List). Pt will benefit from acute skilled PT to increase their independence and safety with mobility to allow discharge.           If plan is discharge home, recommend the following: A little help with bathing/dressing/bathroom;Assistance with cooking/housework;Help with stairs or ramp for entrance   Can travel by private vehicle        Equipment Recommendations Rollator (4 wheels);BSC/3in1  Recommendations for Other Services       Functional Status Assessment Patient has had a recent decline in their functional status and demonstrates the ability to make significant improvements in function in a reasonable and predictable amount of time.     Precautions / Restrictions Precautions Precautions: None Precaution/Restrictions Comments: chest tube Restrictions Weight Bearing Restrictions Per Provider Order: No      Mobility  Bed Mobility Overal bed mobility: Needs Assistance Bed Mobility: Rolling Rolling: Mod assist, Used rails         General bed mobility comments: Difficult and painful due to chest tube    Transfers Overall transfer level: Needs  assistance Equipment used: 2 person hand held assist Transfers: Sit to/from Stand Sit to Stand: Mod assist, +2 physical assistance, From elevated surface           General transfer comment: Progressively inclined bed to full Egress position and pt stood from the full Egress position the first time with mod assist of 2 personds and needed min assist 2 persons the second time.  Declined marching in place.    Ambulation/Gait                  Stairs            Wheelchair Mobility     Tilt Bed    Modified Rankin (Stroke Patients Only)       Balance Overall balance assessment: Needs assistance Sitting-balance support: No upper extremity supported, Feet supported Sitting balance-Leahy Scale: Fair     Standing balance support: Bilateral upper extremity supported, During functional activity Standing balance-Leahy Scale: Poor Standing balance comment: relies on bil UE support                             Pertinent Vitals/Pain Pain Assessment Pain Assessment: 0-10 Pain Score: 8  Pain Location: chest and right side Pain Descriptors / Indicators: Aching, Discomfort, Grimacing, Guarding Pain Intervention(s): Limited activity within patient's tolerance, Monitored during session, Repositioned    Home Living Family/patient expects to be discharged to:: Private residence Living Arrangements: Spouse/significant other Available Help at Discharge: Family;Available 24 hours/day Type of Home: Mobile home Home Access: Stairs to enter Entrance Stairs-Rails: Right;Left;Can  reach both Entrance Stairs-Number of Steps: 5   Home Layout: One level Home Equipment: None 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, full time    Prior Function Prior Level of Function : Independent/Modified Independent;Driving;Working/employed                     Extremity/Trunk Assessment   Upper Extremity Assessment Upper Extremity Assessment: Defer to OT evaluation     Lower Extremity Assessment Lower Extremity Assessment: Generalized weakness    Cervical / Trunk Assessment Cervical / Trunk Assessment: Kyphotic  Communication   Communication Communication: No apparent difficulties    Cognition Arousal: Alert Behavior During Therapy: Flat affect, Anxious   PT - Cognitive impairments: No apparent impairments                         Following commands: Intact       Cueing Cueing Techniques: Tactile cues     General Comments General comments (skin integrity, edema, etc.): 74 bpm, 143/80, 98%  4 LO2    Exercises General Exercises - Lower Extremity Ankle Circles/Pumps: AROM, Both, 5 reps, Seated Long Arc Quad: AROM, Both, 5 reps, Seated   Assessment/Plan    PT Assessment Patient needs continued PT services  PT Problem List Decreased activity tolerance;Decreased balance;Decreased mobility;Decreased knowledge of use of DME;Decreased safety awareness;Cardiopulmonary status limiting activity;Pain       PT Treatment Interventions Gait training;Functional mobility training;DME instruction;Therapeutic activities;Therapeutic exercise;Balance training;Patient/family education;Stair training    PT Goals (Current goals can be found in the Care Plan section)  Acute Rehab PT Goals Patient Stated Goal: to get better PT Goal Formulation: With patient Time For Goal Achievement: 01/16/24 Potential to Achieve Goals: Good    Frequency Min 2X/week     Co-evaluation               AM-PAC PT 6 Clicks Mobility  Outcome Measure Help needed turning from your back to your side while in a flat bed without using bedrails?: A Lot Help needed moving from lying on your back to sitting on the side of a flat bed without using bedrails?: A Lot Help needed moving to and from a bed to a chair (including a wheelchair)?: Total Help needed standing up from a chair using your arms (e.g., wheelchair or bedside chair)?: Total Help needed to walk in  hospital room?: Total Help needed climbing 3-5 steps with a railing? : Total 6 Click Score: 8    End of Session Equipment Utilized During Treatment: Gait belt;Oxygen  Activity Tolerance: Patient limited by fatigue Patient left: in bed;with call bell/phone within reach;with bed alarm set;with SCD's reapplied Nurse Communication: Mobility status PT Visit Diagnosis: Muscle weakness (generalized) (M62.81)    Time: 8977-8945 PT Time Calculation (min) (ACUTE ONLY): 32 min   Charges:   PT Evaluation $PT Eval Moderate Complexity: 1 Mod PT Treatments $Therapeutic Activity: 8-22 mins PT General Charges $$ ACUTE PT VISIT: 1 Visit         Katana Berthold M,PT Acute Rehab Services [phone]   Stephane JULIANNA Bevel 01/02/2024, 1:15 PM

## 2024-01-02 NOTE — TOC Initial Note (Signed)
 Transition of Care [facility]) - Initial/Assessment Note    Patient Details  Name: Albert Hill MRN: [id number] Date of Birth: 1975-09-26  [facility] of [facility]) CM/SW Contact:    Sudie Erminio Deems, RN Phone Number: 01/02/2024, 2:31 PM  Clinical Narrative:  Patient presented for shortness of breath-intubated and extubated 01-02-24. Case Manager spoke with his sister Michaelle Sharps this morning regarding disposition plans. PTA patient was from home in [place] with his girlfriend [place]. Sister states the patient will not be going back to [place]. Sister is attempting to work on getting the patient somewhere to stay. PT/OT recommendations are for [facility] PT at this time. Case Manager will continue to follow the patient for disposition needs as he progresses.              Expected Discharge Plan: Home w Home Health Services Barriers to Discharge: Continued Medical Work up   Patient Goals and CMS Choice Patient states their goals for this hospitalization and ongoing recovery are:: Unsure of disposition plan at this time.   Choice offered to / list presented to : NA      Expected Discharge Plan and Services In-house Referral: NA Discharge Planning Services: CM Consult   Living arrangements for the past 2 months: Single Family Home (Sister Michaelle states patient cannot return to [place] with his girlfriend)                   DME Agency: NA                  Prior Living Arrangements/Services Living arrangements for the past 2 months: Single Family Home (Sister Michaelle states patient cannot return to [place] with his girlfriend) Lives with:: Significant Other Patient language and need for interpreter reviewed:: Yes Do you feel safe going back to the place where you live?:  (family is working on Education officer, museum for this patient.)      Need for Family Participation in Patient Care: Yes (Comment) Care giver support system in place?: Yes (comment)   Criminal  Activity/Legal Involvement Pertinent to Current Situation/Hospitalization: No - Comment as needed  Activities of Daily Living   ADL Screening (condition at time of admission) Independently performs ADLs?: Yes (appropriate for developmental age) Is the patient deaf or have difficulty hearing?: No Does the patient have difficulty seeing, even when wearing glasses/contacts?: No Does the patient have difficulty concentrating, remembering, or making decisions?: No  Permission Sought/Granted Permission sought to share information with : Family Supports                Emotional Assessment Appearance:: Appears stated age         Psych Involvement: No (comment)  Admission diagnosis:  Pneumothorax [J93.9] Patient Active Problem List   Diagnosis Date Noted   Pneumothorax 12/19/2023   Non-recurrent bilateral inguinal hernia without obstruction or gangrene    S/P lumbar discectomy 09/16/2021   Asthma 07/22/2021   Nausea 12/23/2020   Abdominal pain, epigastric 12/23/2020   Loss of weight 12/23/2020   Colon cancer screening 12/23/2020   Gout of ankle 04/11/2018   Penile lesion 04/11/2018   Essential hypertension 01/31/2018   PCP:  Lorren Greig PARAS, NP Pharmacy:   [facility] [phone] - [place], [place] - [address] AT [facility] [address] [place] [address] Phone: [phone] Fax: [phone]     Social Drivers of Health (SDOH) Social History: SDOH Screenings   Food Insecurity: No Food Insecurity (12/30/2023)  Recent Concern: Food Insecurity - Food Insecurity Present (12/24/2023)  Housing: Low Risk  (12/30/2023)  Recent Concern: Housing - High Risk (12/24/2023)  Transportation Needs: No Transportation Needs (12/30/2023)  Recent Concern: Transportation Needs - Unmet Transportation Needs (12/24/2023)  Utilities: Not At Risk (12/30/2023)  Alcohol Screen: Low Risk  (12/24/2023)  Depression (PHQ2-9): High Risk (12/24/2023)  Financial Resource Strain: High Risk  (12/24/2023)  Physical Activity: Inactive (12/24/2023)  Social Connections: Moderately Integrated (12/24/2023)  Stress: Stress Concern Present (12/24/2023)  Tobacco Use: High Risk (12/31/2023)  Health Literacy: Adequate Health Literacy (12/24/2023)   SDOH Interventions:     Readmission Risk Interventions     No data to display

## 2024-01-02 NOTE — Progress Notes (Signed)
 [address]       [place] [address]             [phone]                 2 Days Post-Op Procedure(s) (LRB): ROBOTIC ASSISTED RIGHT VIDEO ASSISTED THORACOSCOPIC SURGERY, BLEB RESECTION, PLEURAL ABRASION (Right) BLOCK, NERVE, INTERCOSTAL (Right)   Events: extubated _______________________________________________________________ Vitals: BP 113/78   Pulse 77   Temp 100.2 F (37.9 C) (Rectal)   Resp 16   Ht 5' 10 (1.778 m)   Wt 79.7 kg   SpO2 94%   BMI 25.21 kg/m  Filed Weights   12/31/23 0912 01/01/24 0500 01/02/24 0500  Weight: 69.9 kg 79.1 kg 79.7 kg     - Neuro: alert NAD  - Cardiovascular: sinus  Drips: none    - Pulm: large air leak  ABG    Component Value Date/Time   PHART 7.447 01/02/2024 0601   PCO2ART 36.2 01/02/2024 0601   PO2ART 84 01/02/2024 0601   HCO3 24.9 01/02/2024 0601   TCO2 26 01/02/2024 0601   ACIDBASEDEF 1.0 12/31/2023 1540   O2SAT 97 01/02/2024 0601    - Abd: ND - Extremity: warm  .Intake/Output      07/15 0701 07/16 0700 07/16 0701 07/17 0700   I.V. (mL/kg) 1810.2 (22.7) 145.9 (1.8)   NG/GT 300    IV Piggyback 759.3 341.1   Total Intake(mL/kg) 2869.5 (36) 486.9 (6.1)   Urine (mL/kg/hr) 1015 (0.5) 470 (0.7)   Emesis/NG output 925    Stool 0    Blood     Chest Tube 270 70   Total Output 2210 540   Net +659.5 -53.1        Stool Occurrence 1 x       _______________________________________________________________ Labs:    Latest Ref Rng & Units 01/02/2024    6:01 AM 01/02/2024    4:50 AM 01/01/2024    7:05 PM  CBC  WBC 4.0 - 10.5 K/uL  14.2    Hemoglobin 13.0 - 17.0 g/dL 89.0  88.4  88.3   Hematocrit 39.0 - 52.0 % 32.0  33.2  34.0   Platelets 150 - 400 K/uL  310        Latest Ref Rng & Units 01/02/2024    6:01 AM 01/02/2024    4:50 AM 01/01/2024    7:05 PM  CMP  Glucose 70 - 99 mg/dL  97    BUN 6 - 20 mg/dL  21    Creatinine 9.38 - 1.24 mg/dL  9.28    Sodium 864 - 854 mmol/L 139  139   138   Potassium 3.5 - 5.1 mmol/L 3.8  3.8  3.7   Chloride 98 - 111 mmol/L  102    CO2 22 - 32 mmol/L  24    Calcium 8.9 - 10.3 mg/dL  8.4    Total Protein 6.5 - 8.1 g/dL  5.4    Total Bilirubin 0.0 - 1.2 mg/dL  0.7    Alkaline Phos 38 - 126 U/L  78    AST 15 - 41 U/L  45    ALT 0 - 44 U/L  34      CXR: No pneumoT  _______________________________________________________________  Assessment and Plan: POD 2 s/p R RATS wedge resection  Neuro: pain controlled CV: stable Pulm: continue CT to suction. Renal: creat stable GI: on diet Heme: stable ID: pan culture, broad spectrum abx Endo:  SSI  Dispo: continue ICU care.   Albert Hill 01/02/2024 2:55 PM

## 2024-01-02 NOTE — Progress Notes (Signed)
   NAME:  Albert Hill, MRN:  [id number], DOB:  1976-03-02, LOS: 4 ADMISSION DATE:  12/29/2023, CONSULTATION DATE:  12/31/23 REFERRING MD:  Shyrl, CHIEF COMPLAINT:  SOB   History of Present Illness:  48 year old man w/ hx of COPD, OSA, HTN who is presenting with recurrent right pneumothorax.  After discussion, underwent VATS w R apical bleb resection, pleural abrasion.  Pre-op noted to have significant hypoxemia and WOB so left intubated post op for a more slow wean.  On arrival to unit saturating in high 80s on 100% FiO2.  PCCM to assist with vent wean.  Pertinent  Medical History  COPD OSA  Significant Hospital Events: Including procedures, antibiotic start and stop dates in addition to other pertinent events   7/12 admit 7/14 VATS, blebectomy, abrasion/pleurodesis  Interim History / Subjective:  Now more awake, O2 needs greatly improved.  Objective    Blood pressure 107/75, pulse 67, temperature 99.1 F (37.3 C), resp. rate 12, height 5' 10 (1.778 m), weight 79.7 kg, SpO2 95%.    Vent Mode: PSV;CPAP FiO2 (%):  [40 %-100 %] 40 % Set Rate:  [28 bmp] 28 bmp Vt Set:  [580 mL] 580 mL PEEP:  [5 cmH20] 5 cmH20 Pressure Support:  [5 cmH20] 5 cmH20 Plateau Pressure:  [17 cmH20-19 cmH20] 17 cmH20   Intake/Output Summary (Last 24 hours) at 01/02/2024 0904 Last data filed at 01/02/2024 0800 Gross per 24 hour  Intake 2790.49 ml  Output 2260 ml  Net 530.49 ml   Filed Weights   12/31/23 0912 01/01/24 0500 01/02/24 0500  Weight: 69.9 kg 79.1 kg 79.7 kg    Examination: No distress Improved breath sounds 1+ air leak on R RASS 0  Good strength, following commands Abd soft Heart sounds regular  Labs ok CXR better yesterday   Resolved problem list   Assessment and Plan  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  Has had breathing trouble for multiple years leading to admission so baseline pulmonary status fragile at best.  Now s/p VATs with  some expected de-recruitment and lung injury.  LE duplex neg. - Chest tube activity per Dr. Shyrl - f/u duplex - Brovana  yupelri  - Empiric vanc/zosyn  x 5 days - Wean to extubate then work on mobility  32 min cc time Rolan Sharps MD PCCM

## 2024-01-02 NOTE — Procedures (Signed)
 Extubation Procedure Note  Patient Details:   Name: Albert Hill DOB: 02-26-1976 MRN: [id number]   Airway Documentation:    Vent end date: 01/02/24 Vent end time: 0750   Evaluation  O2 sats: stable throughout Complications: No apparent complications Patient did tolerate procedure well. Bilateral Breath Sounds: Rhonchi, Diminished   Yes  Patient extubated per order to 4Lnc with no apparent complications. Positive cuff leak was noted prior to extubation. Patient is alert, has strong cough, and is able to speak. Vitals are stable.   Albert Hill 01/02/2024, 7:55 AM

## 2024-01-02 NOTE — Plan of Care (Signed)
  Problem: Education: Goal: Knowledge of General Education information will improve Description: Including pain rating scale, medication(s)/side effects and non-pharmacologic comfort measures Outcome: Progressing   Problem: Health Behavior/Discharge Planning: Goal: Ability to manage health-related needs will improve Outcome: Progressing   Problem: Clinical Measurements: Goal: Ability to maintain clinical measurements within normal limits will improve Outcome: Progressing Goal: Will remain free from infection Outcome: Progressing Goal: Diagnostic test results will improve Outcome: Progressing Goal: Respiratory complications will improve Outcome: Progressing Goal: Cardiovascular complication will be avoided Outcome: Progressing   Problem: Activity: Goal: Risk for activity intolerance will decrease Outcome: Progressing   Problem: Nutrition: Goal: Adequate nutrition will be maintained Outcome: Progressing   Problem: Coping: Goal: Level of anxiety will decrease Outcome: Progressing   Problem: Elimination: Goal: Will not experience complications related to bowel motility Outcome: Progressing Goal: Will not experience complications related to urinary retention Outcome: Progressing   Problem: Pain Managment: Goal: General experience of comfort will improve and/or be controlled Outcome: Progressing   Problem: Safety: Goal: Ability to remain free from injury will improve Outcome: Progressing   Problem: Skin Integrity: Goal: Risk for impaired skin integrity will decrease Outcome: Progressing   Problem: Education: Goal: Knowledge of disease or condition will improve Outcome: Progressing Goal: Knowledge of the prescribed therapeutic regimen will improve Outcome: Progressing   Problem: Activity: Goal: Risk for activity intolerance will decrease Outcome: Progressing   Problem: Cardiac: Goal: Will achieve and/or maintain hemodynamic stability Outcome: Progressing    Problem: Clinical Measurements: Goal: Postoperative complications will be avoided or minimized Outcome: Progressing   Problem: Respiratory: Goal: Respiratory status will improve Outcome: Progressing   Problem: Pain Management: Goal: Pain level will decrease Outcome: Progressing   Problem: Skin Integrity: Goal: Wound healing without signs and symptoms infection will improve Outcome: Progressing   Problem: Safety: Goal: Non-violent Restraint(s) Outcome: Progressing

## 2024-01-03 ENCOUNTER — Inpatient Hospital Stay (HOSPITAL_COMMUNITY)

## 2024-01-03 DIAGNOSIS — E43 Unspecified severe protein-calorie malnutrition: Secondary | ICD-10-CM | POA: Insufficient documentation

## 2024-01-03 LAB — BASIC METABOLIC PANEL WITH GFR
Anion gap: 9 (ref 5–15)
BUN: 15 mg/dL (ref 6–20)
CO2: 23 mmol/L (ref 22–32)
Calcium: 8.5 mg/dL — ABNORMAL LOW (ref 8.9–10.3)
Chloride: 102 mmol/L (ref 98–111)
Creatinine, Ser: 0.72 mg/dL (ref 0.61–1.24)
GFR, Estimated: >60 mL/min (ref >60)
Glucose, Bld: 135 mg/dL — ABNORMAL HIGH (ref 70–99)
Potassium: 3.6 mmol/L (ref 3.5–5.1)
Sodium: 134 mmol/L — ABNORMAL LOW (ref 135–145)

## 2024-01-03 LAB — MAGNESIUM: Magnesium: 1.7 mg/dL (ref 1.7–2.4)

## 2024-01-03 LAB — GLUCOSE, CAPILLARY
Glucose-Capillary: 102 mg/dL — ABNORMAL HIGH (ref 70–99)
Glucose-Capillary: 108 mg/dL — ABNORMAL HIGH (ref 70–99)
Glucose-Capillary: 111 mg/dL — ABNORMAL HIGH (ref 70–99)
Glucose-Capillary: 127 mg/dL — ABNORMAL HIGH (ref 70–99)
Glucose-Capillary: 137 mg/dL — ABNORMAL HIGH (ref 70–99)
Glucose-Capillary: 98 mg/dL (ref 70–99)

## 2024-01-03 LAB — PHOSPHORUS: Phosphorus: 3.6 mg/dL (ref 2.5–4.6)

## 2024-01-03 MED ORDER — METHOCARBAMOL 500 MG PO TABS
1000.0000 mg | ORAL_TABLET | Freq: Three times a day (TID) | ORAL | Status: AC
  Administered 2024-01-03 – 2024-01-05 (×7): 1000 mg via ORAL
  Filled 2024-01-03 (×7): qty 2

## 2024-01-03 MED ORDER — HYDROMORPHONE HCL 1 MG/ML IJ SOLN
1.0000 mg | INTRAMUSCULAR | Status: DC | PRN
  Administered 2024-01-03: 2 mg via INTRAVENOUS
  Administered 2024-01-03: 1 mg via INTRAVENOUS
  Administered 2024-01-03 – 2024-01-05 (×12): 2 mg via INTRAVENOUS
  Administered 2024-01-05: 1 mg via INTRAVENOUS
  Administered 2024-01-06 (×2): 2 mg via INTRAVENOUS
  Administered 2024-01-06: 1 mg via INTRAVENOUS
  Administered 2024-01-06 (×2): 2 mg via INTRAVENOUS
  Administered 2024-01-07 – 2024-01-08 (×7): 1 mg via INTRAVENOUS
  Filled 2024-01-03: qty 1
  Filled 2024-01-03 (×5): qty 2
  Filled 2024-01-03 (×2): qty 1
  Filled 2024-01-03: qty 2
  Filled 2024-01-03 (×6): qty 1
  Filled 2024-01-03 (×7): qty 2
  Filled 2024-01-03: qty 1
  Filled 2024-01-03 (×4): qty 2

## 2024-01-03 MED ORDER — MAGNESIUM SULFATE 2 GM/50ML IV SOLN
2.0000 g | Freq: Once | INTRAVENOUS | Status: AC
  Administered 2024-01-03: 2 g via INTRAVENOUS
  Filled 2024-01-03: qty 50

## 2024-01-03 MED ORDER — NAPHAZOLINE-GLYCERIN 0.012-0.25 % OP SOLN
1.0000 [drp] | Freq: Four times a day (QID) | OPHTHALMIC | Status: DC | PRN
  Administered 2024-01-03: 2 [drp] via OPHTHALMIC
  Filled 2024-01-03: qty 15

## 2024-01-03 MED ORDER — POTASSIUM CHLORIDE CRYS ER 20 MEQ PO TBCR
40.0000 meq | EXTENDED_RELEASE_TABLET | Freq: Once | ORAL | Status: AC
  Administered 2024-01-03: 40 meq via ORAL
  Filled 2024-01-03: qty 2

## 2024-01-03 MED ORDER — GABAPENTIN 300 MG PO CAPS
300.0000 mg | ORAL_CAPSULE | Freq: Three times a day (TID) | ORAL | Status: DC
  Administered 2024-01-03 – 2024-01-15 (×37): 300 mg via ORAL
  Filled 2024-01-03 (×37): qty 1

## 2024-01-03 MED ORDER — POTASSIUM CHLORIDE 20 MEQ PO PACK: 40.0000 meq | PACK | Freq: Once | ORAL | Status: DC

## 2024-01-03 MED ORDER — ORAL CARE MOUTH RINSE: 15.0000 mL | OROMUCOSAL | Status: DC | PRN

## 2024-01-03 MED ORDER — PHENOL 1.4 % MT LIQD
1.0000 | OROMUCOSAL | Status: DC | PRN
  Administered 2024-01-03 (×2): 1 via OROMUCOSAL
  Filled 2024-01-03: qty 177

## 2024-01-03 MED ORDER — IPRATROPIUM-ALBUTEROL 0.5-2.5 (3) MG/3ML IN SOLN
3.0000 mL | RESPIRATORY_TRACT | Status: DC | PRN
  Administered 2024-01-03 – 2024-01-04 (×3): 3 mL via RESPIRATORY_TRACT
  Filled 2024-01-03 (×4): qty 3

## 2024-01-03 MED ORDER — KETOROLAC TROMETHAMINE 15 MG/ML IJ SOLN
30.0000 mg | Freq: Three times a day (TID) | INTRAMUSCULAR | Status: AC
  Administered 2024-01-03 – 2024-01-07 (×15): 30 mg via INTRAVENOUS
  Filled 2024-01-03 (×15): qty 2

## 2024-01-03 MED ORDER — LORAZEPAM 2 MG/ML IJ SOLN
1.0000 mg | Freq: Four times a day (QID) | INTRAMUSCULAR | Status: DC | PRN
  Administered 2024-01-08 – 2024-01-09 (×4): 1 mg via INTRAVENOUS
  Administered 2024-01-10 – 2024-01-14 (×7): 2 mg via INTRAVENOUS
  Filled 2024-01-03 (×13): qty 1

## 2024-01-03 MED ORDER — ENSURE PLUS HIGH PROTEIN PO LIQD
237.0000 mL | Freq: Two times a day (BID) | ORAL | Status: DC
  Administered 2024-01-03 – 2024-01-04 (×4): 237 mL via ORAL

## 2024-01-03 NOTE — Anesthesia Postprocedure Evaluation (Signed)
 Anesthesia Post Note  Patient: Albert Hill  Procedure(s) Performed: ROBOTIC ASSISTED RIGHT VIDEO ASSISTED THORACOSCOPIC SURGERY, BLEB RESECTION, PLEURAL ABRASION (Right: Chest) BLOCK, NERVE, INTERCOSTAL (Right: Chest)     Patient location during evaluation: PACU Anesthesia Type: General Level of consciousness: awake and alert Pain management: pain level controlled Vital Signs Assessment: post-procedure vital signs reviewed and stable Respiratory status: spontaneous breathing, nonlabored ventilation, respiratory function stable and patient connected to nasal cannula oxygen  Cardiovascular status: blood pressure returned to baseline and stable Postop Assessment: no apparent nausea or vomiting Anesthetic complications: no   No notable events documented.  Last Vitals:  Vitals:   01/03/24 0800 01/03/24 0803  BP:    Pulse: 78   Resp: 13   Temp:  36.8 C  SpO2: 93%     Last Pain:  Vitals:   01/03/24 0803  TempSrc: Oral  PainSc:                  Kerensa Nicklas S

## 2024-01-03 NOTE — Plan of Care (Signed)
  Problem: Education: Goal: Knowledge of General Education information will improve Description: Including pain rating scale, medication(s)/side effects and non-pharmacologic comfort measures Outcome: Progressing   Problem: Health Behavior/Discharge Planning: Goal: Ability to manage health-related needs will improve Outcome: Progressing   Problem: Clinical Measurements: Goal: Ability to maintain clinical measurements within normal limits will improve Outcome: Progressing Goal: Will remain free from infection Outcome: Progressing Goal: Diagnostic test results will improve Outcome: Progressing Goal: Respiratory complications will improve Outcome: Progressing Goal: Cardiovascular complication will be avoided Outcome: Progressing   Problem: Activity: Goal: Risk for activity intolerance will decrease Outcome: Progressing   Problem: Nutrition: Goal: Adequate nutrition will be maintained Outcome: Progressing   Problem: Coping: Goal: Level of anxiety will decrease Outcome: Progressing   Problem: Elimination: Goal: Will not experience complications related to bowel motility Outcome: Progressing Goal: Will not experience complications related to urinary retention Outcome: Progressing   Problem: Pain Managment: Goal: General experience of comfort will improve and/or be controlled Outcome: Progressing   Problem: Safety: Goal: Ability to remain free from injury will improve Outcome: Progressing   Problem: Skin Integrity: Goal: Risk for impaired skin integrity will decrease Outcome: Progressing   Problem: Education: Goal: Knowledge of disease or condition will improve Outcome: Progressing Goal: Knowledge of the prescribed therapeutic regimen will improve Outcome: Progressing   Problem: Activity: Goal: Risk for activity intolerance will decrease Outcome: Progressing   Problem: Cardiac: Goal: Will achieve and/or maintain hemodynamic stability Outcome: Progressing    Problem: Clinical Measurements: Goal: Postoperative complications will be avoided or minimized Outcome: Progressing   Problem: Respiratory: Goal: Respiratory status will improve Outcome: Progressing

## 2024-01-03 NOTE — Progress Notes (Addendum)
 eLink Physician-Brief Progress Note Patient Name: Albert Hill DOB: 1975/10/17 MRN: [id number]   Date of Service  01/03/2024  HPI/Events of Note  extubated earlier today and c/o sore throat  eICU Interventions  Phenol spray   0130 -coarse breath sounds, add breathing treatments  Intervention Category Minor Interventions: Routine modifications to care plan (e.g. PRN medications for pain, fever)  Brentyn Seehafer 01/03/2024, 12:11 AM

## 2024-01-03 NOTE — Progress Notes (Signed)
 [address]       [place] [address]             [phone]                 3 Days Post-Op Procedure(s) (LRB): ROBOTIC ASSISTED RIGHT VIDEO ASSISTED THORACOSCOPIC SURGERY, BLEB RESECTION, PLEURAL ABRASION (Right) BLOCK, NERVE, INTERCOSTAL (Right)   Events: extubated _______________________________________________________________ Vitals: BP 108/76   Pulse 78   Temp 98.2 F (36.8 C) (Oral)   Resp 13   Ht 5' 10 (1.778 m)   Wt 79.7 kg   SpO2 93%   BMI 25.21 kg/m  Filed Weights   12/31/23 0912 01/01/24 0500 01/02/24 0500  Weight: 69.9 kg 79.1 kg 79.7 kg     - Neuro: alert NAD  - Cardiovascular: sinus  Drips: none    - Pulm: small air leak  ABG    Component Value Date/Time   PHART 7.447 01/02/2024 0601   PCO2ART 36.2 01/02/2024 0601   PO2ART 84 01/02/2024 0601   HCO3 24.9 01/02/2024 0601   TCO2 26 01/02/2024 0601   ACIDBASEDEF 1.0 12/31/2023 1540   O2SAT 97 01/02/2024 0601    - Abd: ND - Extremity: warm  .Intake/Output      07/16 0701 07/17 0700 07/17 0701 07/18 0700   P.O. 1080 240   I.V. (mL/kg) 145.9 (1.8) 15 (0.2)   NG/GT     IV Piggyback 740.8 27.5   Total Intake(mL/kg) 1966.7 (24.7) 282.5 (3.5)   Urine (mL/kg/hr) 1650 (0.9)    Emesis/NG output     Stool     Chest Tube 130 0   Total Output 1780 0   Net +186.7 +282.5           _______________________________________________________________ Labs:    Latest Ref Rng & Units 01/02/2024    6:01 AM 01/02/2024    4:50 AM 01/01/2024    7:05 PM  CBC  WBC 4.0 - 10.5 K/uL  14.2    Hemoglobin 13.0 - 17.0 g/dL 89.0  88.4  88.3   Hematocrit 39.0 - 52.0 % 32.0  33.2  34.0   Platelets 150 - 400 K/uL  310        Latest Ref Rng & Units 01/03/2024    3:55 AM 01/02/2024    6:01 AM 01/02/2024    4:50 AM  CMP  Glucose 70 - 99 mg/dL 864   97   BUN 6 - 20 mg/dL 15   21   Creatinine 9.38 - 1.24 mg/dL 9.27   9.28   Sodium 864 - 145 mmol/L 134  139  139   Potassium 3.5 - 5.1  mmol/L 3.6  3.8  3.8   Chloride 98 - 111 mmol/L 102   102   CO2 22 - 32 mmol/L 23   24   Calcium 8.9 - 10.3 mg/dL 8.5   8.4   Total Protein 6.5 - 8.1 g/dL   5.4   Total Bilirubin 0.0 - 1.2 mg/dL   0.7   Alkaline Phos 38 - 126 U/L   78   AST 15 - 41 U/L   45   ALT 0 - 44 U/L   34     CXR: -  _______________________________________________________________  Assessment and Plan: POD 3 s/p R RATS wedge resection  Neuro: pain controlled CV: stable Pulm: CT to waterseal.  IS, ambulation Renal: creat stable GI: on diet Heme: stable ID: pan culture, broad spectrum abx Endo: SSI  Dispo: floor   Albert Hill 01/03/2024 8:29 AM

## 2024-01-04 ENCOUNTER — Inpatient Hospital Stay (HOSPITAL_COMMUNITY)

## 2024-01-04 DIAGNOSIS — J9811 Atelectasis: Secondary | ICD-10-CM | POA: Diagnosis not present

## 2024-01-04 DIAGNOSIS — J939 Pneumothorax, unspecified: Secondary | ICD-10-CM | POA: Diagnosis not present

## 2024-01-04 DIAGNOSIS — J9383 Other pneumothorax: Secondary | ICD-10-CM | POA: Diagnosis not present

## 2024-01-04 DIAGNOSIS — R918 Other nonspecific abnormal finding of lung field: Secondary | ICD-10-CM | POA: Diagnosis not present

## 2024-01-04 DIAGNOSIS — Z4682 Encounter for fitting and adjustment of non-vascular catheter: Secondary | ICD-10-CM | POA: Diagnosis not present

## 2024-01-04 LAB — BASIC METABOLIC PANEL WITH GFR
Anion gap: 8 (ref 5–15)
BUN: 18 mg/dL (ref 6–20)
CO2: 26 mmol/L (ref 22–32)
Calcium: 8.2 mg/dL — ABNORMAL LOW (ref 8.9–10.3)
Chloride: 102 mmol/L (ref 98–111)
Creatinine, Ser: 0.81 mg/dL (ref 0.61–1.24)
GFR, Estimated: >60 mL/min (ref >60)
Glucose, Bld: 104 mg/dL — ABNORMAL HIGH (ref 70–99)
Potassium: 4.1 mmol/L (ref 3.5–5.1)
Sodium: 136 mmol/L (ref 135–145)

## 2024-01-04 LAB — GLUCOSE, CAPILLARY
Glucose-Capillary: 101 mg/dL — ABNORMAL HIGH (ref 70–99)
Glucose-Capillary: 113 mg/dL — ABNORMAL HIGH (ref 70–99)
Glucose-Capillary: 115 mg/dL — ABNORMAL HIGH (ref 70–99)
Glucose-Capillary: 119 mg/dL — ABNORMAL HIGH (ref 70–99)
Glucose-Capillary: 126 mg/dL — ABNORMAL HIGH (ref 70–99)

## 2024-01-04 LAB — CBC
HCT: 31.7 % — ABNORMAL LOW (ref 39.0–52.0)
Hemoglobin: 10.6 g/dL — ABNORMAL LOW (ref 13.0–17.0)
MCH: 30.2 pg (ref 26.0–34.0)
MCHC: 33.4 g/dL (ref 30.0–36.0)
MCV: 90.3 fL (ref 80.0–100.0)
Platelets: 244 K/uL (ref 150–400)
RBC: 3.51 MIL/uL — ABNORMAL LOW (ref 4.22–5.81)
RDW: 12.6 % (ref 11.5–15.5)
WBC: 9.2 K/uL (ref 4.0–10.5)
nRBC: 0 % (ref 0.0–0.2)

## 2024-01-04 LAB — MAGNESIUM: Magnesium: 1.8 mg/dL (ref 1.7–2.4)

## 2024-01-04 LAB — PHOSPHORUS: Phosphorus: 3.8 mg/dL (ref 2.5–4.6)

## 2024-01-04 MED ORDER — LISINOPRIL 20 MG PO TABS
10.0000 mg | ORAL_TABLET | Freq: Every day | ORAL | Status: DC
  Administered 2024-01-04 – 2024-01-07 (×4): 10 mg via ORAL
  Filled 2024-01-04 (×4): qty 1

## 2024-01-04 NOTE — TOC Progression Note (Addendum)
 Transition of Care [facility]) - Progression Note    Patient Details  Name: Albert Hill MRN: [id number] Date of Birth: [birth year]  [facility] CM/SW Contact  Lauraine FORBES Saa, LCSW Phone Number: 01/04/2024, 10:52 AM  Clinical Narrative:     10:52 AM Per bedside RN yesterday, patient expressed interest in substance use resources. CSW provided requested resources.  1:36 PM Per RNCM, patient expressed interest in applying for disability. CSW consulted financial counseling for further assistance.  Expected Discharge Plan: Home w Home Health Services Barriers to Discharge: Continued Medical Work up  Expected Discharge Plan and Services In-house Referral: NA Discharge Planning Services: CM Consult   Living arrangements for the past 2 months: Single Family Home (Sister Michaelle states patient cannot return to [place] with his girlfriend)                   DME Agency: NA                   Social Determinants of Health (SDOH) Interventions SDOH Screenings   Food Insecurity: No Food Insecurity (12/30/2023)  Recent Concern: Food Insecurity - Food Insecurity Present (12/24/2023)  Housing: Low Risk  (12/30/2023)  Recent Concern: Housing - High Risk (12/24/2023)  Transportation Needs: No Transportation Needs (12/30/2023)  Recent Concern: Transportation Needs - Unmet Transportation Needs (12/24/2023)  Utilities: Not At Risk (12/30/2023)  Alcohol Screen: Low Risk  (12/24/2023)  Depression (PHQ2-9): High Risk (12/24/2023)  Financial Resource Strain: High Risk (12/24/2023)  Physical Activity: Inactive (12/24/2023)  Social Connections: Moderately Integrated (12/24/2023)  Stress: Stress Concern Present (12/24/2023)  Tobacco Use: High Risk (12/31/2023)  Health Literacy: Adequate Health Literacy (12/24/2023)    Readmission Risk Interventions     No data to display

## 2024-01-04 NOTE — Progress Notes (Signed)
 Physical Therapy Treatment Patient Details Name: Albert Hill MRN: [id number] DOB: August 30, 1975 Today's Date: 01/04/2024   History of Present Illness 48 year old man admitted 7/14  presenting with recurrent right pneumothorax.  After discussion, underwent VATS w R apical bleb resection, pleural abrasion.  Pre-op noted to have significant hypoxemia and WOB so left intubated post op for a more slow wean.  VDRF 7/14-7/16.  PMH: COPD, OSA, HTN    PT Comments  Making good progress towards functional goals. CGA to transfer and supervision for safety and equipment management while ambulating with RW up to 250 today. SpO2 90% and greater on 4L supplemental O2, minimal dyspnea. 96% on 5L at rest. Patient will continue to benefit from skilled physical therapy services to further improve independence with functional mobility.    If plan is discharge home, recommend the following: A little help with bathing/dressing/bathroom;Assistance with cooking/housework;Help with stairs or ramp for entrance   Can travel by private vehicle        Equipment Recommendations  Rollator (4 wheels)    Recommendations for Other Services       Precautions / Restrictions Precautions Precautions: None Recall of Precautions/Restrictions: Intact Precaution/Restrictions Comments: chest tube Restrictions Weight Bearing Restrictions Per Provider Order: No     Mobility  Bed Mobility               General bed mobility comments: Sitting EOB when PT entered room    Transfers Overall transfer level: Needs assistance Equipment used: Rolling walker (2 wheels) Transfers: Sit to/from Stand Sit to Stand: Contact guard assist           General transfer comment: CGA for safety, fair power up, light UE support on RW upon standing. Assisted with multiple lines/leads    Ambulation/Gait Ambulation/Gait assistance: Supervision Gait Distance (Feet): 250 Feet Assistive device: Rolling walker (2 wheels) Gait  Pattern/deviations: Step-through pattern, Decreased stride length, Drifts right/left Gait velocity: dec Gait velocity interpretation: <1.8 ft/sec, indicate of risk for recurrent falls   General Gait Details: Mild instability but able to self correct with cues for awareness and light support from RW to steady. No buckling noted. SpO2 maintained 90% and greater on 4L supplmental O2 while ambualting. Cues for pacing, forward gaze and symptom awareness.   Stairs             Wheelchair Mobility     Tilt Bed    Modified Rankin (Stroke Patients Only)       Balance Overall balance assessment: Needs assistance Sitting-balance support: No upper extremity supported, Feet supported Sitting balance-Leahy Scale: Good     Standing balance support: Bilateral upper extremity supported, During functional activity Standing balance-Leahy Scale: Poor Standing balance comment: relies on bil UE support                            Communication Communication Communication: No apparent difficulties  Cognition Arousal: Alert Behavior During Therapy: WFL for tasks assessed/performed   PT - Cognitive impairments: No apparent impairments                         Following commands: Intact      Cueing Cueing Techniques: Verbal cues  Exercises      General Comments General comments (skin integrity, edema, etc.): HR 91 at rest, SpO2 96% on 5L      Pertinent Vitals/Pain Pain Assessment Pain Assessment: No/denies pain    Home Living  Prior Function            PT Goals (current goals can now be found in the care plan section) Acute Rehab PT Goals Patient Stated Goal: to get better PT Goal Formulation: With patient Time For Goal Achievement: 01/16/24 Potential to Achieve Goals: Good Progress towards PT goals: Progressing toward goals    Frequency    Min 2X/week      PT Plan      Co-evaluation               AM-PAC PT 6 Clicks Mobility   Outcome Measure  Help needed turning from your back to your side while in a flat bed without using bedrails?: A Little Help needed moving from lying on your back to sitting on the side of a flat bed without using bedrails?: A Little Help needed moving to and from a bed to a chair (including a wheelchair)?: A Little Help needed standing up from a chair using your arms (e.g., wheelchair or bedside chair)?: A Little Help needed to walk in hospital room?: A Little Help needed climbing 3-5 steps with a railing? : A Little 6 Click Score: 18    End of Session Equipment Utilized During Treatment: Oxygen  Activity Tolerance: Patient tolerated treatment well Patient left: in bed;with call bell/phone within reach;with bed alarm set;with family/visitor present (Sitting EOB as found) Nurse Communication: Mobility status PT Visit Diagnosis: Muscle weakness (generalized) (M62.81);Difficulty in walking, not elsewhere classified (R26.2);Unsteadiness on feet (R26.81);Other abnormalities of gait and mobility (R26.89)     Time: 1105-1130 PT Time Calculation (min) (ACUTE ONLY): 25 min  Charges:    $Gait Training: 8-22 mins $Therapeutic Activity: 8-22 mins PT General Charges $$ ACUTE PT VISIT: 1 Visit                     Leontine Roads, PT, DPT [facility]  Rehabilitation Services Physical Therapist Office: [phone] Website: [email or web address]    Leontine GORMAN Roads 01/04/2024, 1:14 PM

## 2024-01-04 NOTE — TOC Progression Note (Signed)
 Transition of Care [facility]) - Progression Note    Patient Details  Name: Albert Hill MRN: [id number] Date of Birth: 02-18-1976  Transition of Care [facility]) CM/SW Contact  [name], RN Phone Number: 01/04/2024, 1:54 PM  Clinical Narrative:     Beatris to patient regarding OP PT referral. Patient states he plans to go to drug rehab @ discharge.  ICM will continue to follow for DME and referral needs.  Expected Discharge Plan: Home w Home Health Services Barriers to Discharge: Continued Medical Work up  Expected Discharge Plan and Services In-house Referral: NA Discharge Planning Services: CM Consult   Living arrangements for the past 2 months: Single Family Home (Sister Michaelle states patient cannot return to [place] with his girlfriend)                   DME Agency: NA                   Social Determinants of Health (SDOH) Interventions SDOH Screenings   Food Insecurity: No Food Insecurity (12/30/2023)  Recent Concern: Food Insecurity - Food Insecurity Present (12/24/2023)  Housing: Low Risk  (12/30/2023)  Recent Concern: Housing - High Risk (12/24/2023)  Transportation Needs: No Transportation Needs (12/30/2023)  Recent Concern: Transportation Needs - Unmet Transportation Needs (12/24/2023)  Utilities: Not At Risk (12/30/2023)  Alcohol Screen: Low Risk  (12/24/2023)  Depression (PHQ2-9): High Risk (12/24/2023)  Financial Resource Strain: High Risk (12/24/2023)  Physical Activity: Inactive (12/24/2023)  Social Connections: Moderately Integrated (12/24/2023)  Stress: Stress Concern Present (12/24/2023)  Tobacco Use: High Risk (12/31/2023)  Health Literacy: Adequate Health Literacy (12/24/2023)    Readmission Risk Interventions     No data to display

## 2024-01-04 NOTE — Plan of Care (Signed)
  Problem: Education: Goal: Knowledge of General Education information will improve Description: Including pain rating scale, medication(s)/side effects and non-pharmacologic comfort measures Outcome: Progressing   Problem: Nutrition: Goal: Adequate nutrition will be maintained Outcome: Progressing   Problem: Pain Managment: Goal: General experience of comfort will improve and/or be controlled Outcome: Progressing

## 2024-01-04 NOTE — Progress Notes (Signed)
 PROGRESS NOTE    Albert Hill  FMW:[id number] DOB: 1976/05/24 DOA: 12/29/2023 PCP: Lorren Greig PARAS, NP   Brief Narrative:  48 year old man w/ hx of COPD, OSA, HTN who presented with recurrent right pneumothorax. After discussion, underwent VATS w R apical bleb resection, pleural abrasion. Pre-op noted to have significant hypoxemia and WOB so left intubated. Extubated on 7/16, transferred care to [facility] on 7/18. Right sided chest tube in place,connected to water seal. CTS on board.    Assessment & Plan:  Principal Problem:   Recurrent spontaneous pneumothorax Active Problems:   Protein-calorie malnutrition, severe   Recurrent Pneumothorax,POA:  In the setting of bullous emphysema, continued tobacco abuse S/p Right sided VATS Extubated on 7/16. Right sided chest tube in place,connected to water seal. Cardiothoracic surgery on board and managing chest tube. Chest tube was kinked and there was large air leak in pleurovac so chest tube was un kinked and redressed by CTS team today. F/u repeat CXR 7/18: Persistent right small pneumothorax.  COPD: Not in exacerbation: Out patient follow up with pulmonology. We spoke about the importance of tobacco cessation. Prn inhalational bronchodilators  Hypertension: Starting Lisinopril  10 mg on 7/18. Close BP Monitoring.  Tobacco abuse: Advised strongly regarding tobacco cessation.  Disposition: Home, IADL.   DVT prophylaxis: enoxaparin  (LOVENOX ) injection 40 mg Start: 01/01/24 2200 SCD's Start: 12/31/23 1518     Code Status: Full Code Family Communication:   Status is: Inpatient Remains inpatient appropriate because: Chest tube in place    Subjective:  Denies chest pain or shortness of breath. Feeling better. CXR done this morning. He told me he lives at home with his family and is IADL at baseline. He has stopped smoking.  Examination:  General exam: Appears calm and comfortable  Respiratory system: Clear to auscultation.  Respiratory effort normal. Right sided CT in place Cardiovascular system: S1 & S2 heard, RRR. No JVD, murmurs, rubs, gallops or clicks. No pedal edema. Gastrointestinal system: Abdomen is nondistended, soft and nontender. No organomegaly or masses felt. Normal bowel sounds heard. Central nervous system: Alert and oriented. No focal neurological deficits. Extremities: Symmetric 5 x 5 power. Skin: No rashes, lesions or ulcers Psychiatry: Judgement and insight appear normal. Mood & affect appropriate.      Diet Orders (From admission, onward)     Start     Ordered   01/02/24 1317  Diet regular Room service appropriate? Yes; Fluid consistency: Thin  Diet effective now       Question Answer Comment  Room service appropriate? Yes   Fluid consistency: Thin      01/02/24 1317            Objective: Vitals:   01/03/24 1926 01/03/24 2333 01/04/24 0424 01/04/24 0802  BP: (!) 151/96 125/77 (!) 144/96 (!) 129/91  Pulse: 93 98 94 88  Resp: 16 14 16 13   Temp: 98 F (36.7 C) 98.4 F (36.9 C) 98 F (36.7 C) 97.8 F (36.6 C)  TempSrc: Oral Oral Oral Oral  SpO2: 93% 91% 91% (!) 89%  Weight:      Height:        Intake/Output Summary (Last 24 hours) at 01/04/2024 1036 Last data filed at 01/04/2024 0920 Gross per 24 hour  Intake 643.6 ml  Output 1140 ml  Net -496.4 ml   Filed Weights   01/02/24 0500 01/03/24 1544  Weight: 79.7 kg 78.6 kg    Scheduled Meds:  acetaminophen   1,000 mg Oral Q6H   Or  acetaminophen  (TYLENOL ) oral liquid 160 mg/5 mL  1,000 mg Oral Q6H   allopurinol   300 mg Oral Daily   arformoterol   15 mcg Nebulization BID   bisacodyl   10 mg Oral Daily   enoxaparin  (LOVENOX ) injection  40 mg Subcutaneous QHS   feeding supplement  237 mL Oral BID BM   gabapentin   300 mg Oral TID   ketorolac   30 mg Intravenous Q8H   methocarbamol   1,000 mg Oral TID   mupirocin  ointment  1 Application Nasal BID   nicotine   21 mg Transdermal Daily   pantoprazole   40 mg Oral Daily    polyethylene glycol  17 g Oral Daily   revefenacin   175 mcg Nebulization Daily   senna-docusate  1 tablet Oral BID   thiamine   100 mg Oral Daily   Continuous Infusions:  piperacillin -tazobactam (ZOSYN )  IV 3.375 g (01/04/24 0510)   vancomycin  1,500 mg (01/04/24 0927)    Nutritional status Signs/Symptoms: moderate fat depletion, moderate muscle depletion, severe muscle depletion Interventions: Tube feeding Body mass index is 24.86 kg/m.  Data Reviewed:   CBC: Recent Labs  Lab 12/30/23 0856 12/30/23 2205 12/31/23 1322 01/01/24 0523 01/01/24 0538 01/01/24 1905 01/02/24 0450 01/02/24 0601 01/04/24 0248  WBC 9.6 10.2  --  17.5*  --   --  14.2*  --  9.2  HGB 13.3 11.7*   < > 11.8* 10.9* 11.6* 11.5* 10.9* 10.6*  HCT 39.2 34.6*   < > 33.5* 32.0* 34.0* 33.2* 32.0* 31.7*  MCV 92.5 90.6  --  89.3  --   --  90.5  --  90.3  PLT 256 223  --  285  --   --  310  --  244   < > = values in this interval not displayed.   Basic Metabolic Panel: Recent Labs  Lab 12/30/23 2205 12/31/23 1322 01/01/24 0523 01/01/24 0538 01/01/24 1427 01/01/24 1905 01/02/24 0450 01/02/24 0601 01/03/24 0355 01/04/24 0248  NA 137   < > 137   < >  --  138 139 139 134* 136  K 4.4   < > 4.2   < >  --  3.7 3.8 3.8 3.6 4.1  CL 104  --  102  --   --   --  102  --  102 102  CO2 27  --  26  --   --   --  24  --  23 26  GLUCOSE 97  --  121*  --   --   --  97  --  135* 104*  BUN 15  --  20  --   --   --  21*  --  15 18  CREATININE 0.76  --  0.82  --   --   --  0.71  --  0.72 0.81  CALCIUM 8.5*  --  8.8*  --   --   --  8.4*  --  8.5* 8.2*  MG  --   --   --   --  2.1  --  2.1  --  1.7 1.8  PHOS  --   --   --   --  3.2  --  2.9  --  3.6 3.8   < > = values in this interval not displayed.   GFR: Estimated Creatinine Clearance: 113.9 mL/min (by C-G formula based on SCr of 0.81 mg/dL). Liver Function Tests: Recent Labs  Lab 12/30/23 2205 01/02/24 0450  AST 12* 45*  ALT 12 34  ALKPHOS 75 78  BILITOT 0.5  0.7  PROT 5.7* 5.4*  ALBUMIN  2.9* 2.3*   No results for input(s): LIPASE, AMYLASE in the last 168 hours. No results for input(s): AMMONIA in the last 168 hours. Coagulation Profile: Recent Labs  Lab 12/30/23 2205  INR 1.0   Cardiac Enzymes: No results for input(s): CKTOTAL, CKMB, CKMBINDEX, TROPONINI in the last 168 hours. BNP (last 3 results) No results for input(s): PROBNP in the last 8760 hours. HbA1C: No results for input(s): HGBA1C in the last 72 hours. CBG: Recent Labs  Lab 01/03/24 1209 01/03/24 1612 01/03/24 2018 01/03/24 2351 01/04/24 0439  GLUCAP 108* 111* 137* 102* 113*   Lipid Profile: No results for input(s): CHOL, HDL, LDLCALC, TRIG, CHOLHDL, LDLDIRECT in the last 72 hours. Thyroid Function Tests: No results for input(s): TSH, T4TOTAL, FREET4, T3FREE, THYROIDAB in the last 72 hours. Anemia Panel: No results for input(s): VITAMINB12, FOLATE, FERRITIN, TIBC, IRON, RETICCTPCT in the last 72 hours. Sepsis Labs: No results for input(s): PROCALCITON, LATICACIDVEN in the last 168 hours.  Recent Results (from the past 240 hours)  Surgical pcr screen     Status: Abnormal   Collection Time: 12/31/23  9:14 AM   Specimen: Nasal Mucosa; Nasal Swab  Result Value Ref Range Status   MRSA, PCR NEGATIVE NEGATIVE Final   Staphylococcus aureus POSITIVE (A) NEGATIVE Final    Comment: (NOTE) The Xpert SA Assay (FDA approved for NASAL specimens in patients 39 years of age and older), is one component of a comprehensive surveillance program. It is not intended to diagnose infection nor to guide or monitor treatment. Performed at [facility], [address] [place], [place] [address]          Radiology Studies: DG Chest Port 1 View Result Date: 01/04/2024 EXAM: 1 VIEW XRAY OF THE CHEST 01/04/2024 06:07:45 AM COMPARISON: 01/03/2024 CLINICAL HISTORY: Pneumothorax FINDINGS: LUNGS AND PLEURA: Right chest tube  is again noted with tip terminating over the right apex. Persistent small right pneumothorax with apical and basilar components which are unchanged from the previous exam. Progressive opacification within the medial and lateral right lower lung. Mild subsegmental atelectasis noted in the left base. HEART AND MEDIASTINUM: The cardiomediastinal contours are stable. BONES AND SOFT TISSUES: No acute osseous abnormality. IMPRESSION: 1. Persistent small right pneumothorax with apical and basilar components, unchanged from the previous exam. 2. Progressive opacification within the medial and lateral right lower lung. 3. Mild subsegmental atelectasis in the left base. Electronically signed by: Waddell Calk MD 01/04/2024 08:36 AM EDT RP Workstation: [id number]   DG Chest Port 1 View Result Date: 01/03/2024 CLINICAL DATA:  Follow up pneumothorax. EXAM: PORTABLE CHEST 1 VIEW COMPARISON:  Radiographs 01/01/2024 and 12/31/2023.  CT 12/31/2023. FINDINGS: 0744 hours. Right chest tube extends to the right lung apex. Recurrent small right-sided pneumothorax with apical and basilar components. There are lower lung volumes with increased patchy airspace opacities at both lung bases. Probable surgical clips at the right lung apex. The heart size and mediastinal contours are stable, without mediastinal shift. No significant pleural effusion. No acute osseous findings. IMPRESSION: 1. Recurrent small right-sided pneumothorax with chest tube in place. 2. Lower lung volumes with increased patchy airspace opacities at both lung bases, likely atelectasis. Electronically Signed   By: Elsie Perone M.D.   On: 01/03/2024 11:41           LOS: 6 days   Time spent= 40 mins    Deliliah Room,  MD [facility]  If 7PM-7AM, please contact night-coverage  01/04/2024, 10:36 AM

## 2024-01-04 NOTE — Progress Notes (Addendum)
 4 Days Post-Op Procedure(s) (LRB): ROBOTIC ASSISTED RIGHT VIDEO ASSISTED THORACOSCOPIC SURGERY, BLEB RESECTION, PLEURAL ABRASION (Right) BLOCK, NERVE, INTERCOSTAL (Right) Subjective: Patient reports some mild pain at the chest tube site as well as mild tolerable pain with coughing. No immediate complaints or concerns.   Objective: Vital signs in last 24 hours: Temp:  [97.7 F (36.5 C)-98.4 F (36.9 C)] 98 F (36.7 C) (07/18 0424) Pulse Rate:  [74-98] 94 (07/18 0424) Cardiac Rhythm: Normal sinus rhythm (07/18 0730) Resp:  [11-25] 16 (07/18 0424) BP: (114-151)/(74-99) 144/96 (07/18 0424) SpO2:  [91 %-97 %] 91 % (07/18 0424) Weight:  [78.6 kg] 78.6 kg (07/17 1544)    Intake/Output from previous day: 07/17 0701 - 07/18 0700 In: 947.1 [P.O.:480; I.V.:25; IV Piggyback:442.1] Out: 1110 [Urine:1025; Chest Tube:85] Intake/Output this shift: No intake/output data recorded.  General appearance: alert, cooperative, and no distress.   Lab Results: Recent Labs    01/02/24 0450 01/02/24 0601 01/04/24 0248  WBC 14.2*  --  9.2  HGB 11.5* 10.9* 10.6*  HCT 33.2* 32.0* 31.7*  PLT 310  --  244   BMET:  Recent Labs    01/03/24 0355 01/04/24 0248  NA 134* 136  K 3.6 4.1  CL 102 102  CO2 23 26  GLUCOSE 135* 104*  BUN 15 18  CREATININE 0.72 0.81  CALCIUM 8.5* 8.2*    PT/INR: No results for input(s): LABPROT, INR in the last 72 hours. ABG    Component Value Date/Time   PHART 7.447 01/02/2024 0601   HCO3 24.9 01/02/2024 0601   TCO2 26 01/02/2024 0601   ACIDBASEDEF 1.0 12/31/2023 1540   O2SAT 97 01/02/2024 0601   CBG (last 3)  Recent Labs    01/03/24 2018 01/03/24 2351 01/04/24 0439  GLUCAP 137* 102* 113*    Assessment/Plan: S/P Procedure(s) (LRB): ROBOTIC ASSISTED RIGHT VIDEO ASSISTED THORACOSCOPIC SURGERY, BLEB RESECTION, PLEURAL ABRASION (Right) BLOCK, NERVE, INTERCOSTAL (Right) Chest tube remains in place with ongoing bubbling noted on reassessment indicating  possible air leak. Tube had been affixed to foley catheter to prevent patient accidentally removing tube. This resulted in a 90 degree kink in the chest tube. Tube was straightened, dressing was changed d/t concern for possible leak around the tube. No signs of respiratory distress, infection or hemodynamic instability. No other immediate post op concerns at this time. Recommend ambulation as able. Continue to monitor for leaks   LOS: 6 days    Albert Hill 01/04/2024  Agree with above documentation by PA- Student Hill.   Patient with continued pain.  He is coughing up a lot of sputum.  Hoping to get tube removed soon.  Gen: NAD Heart: RRR Lungs: CTA bilaterally Incisions: bloody drainage, dressing changed... CT kinked about 90 degree  A/P:  CV- NSR, BP is elevated- patient on Lisinopril  at home, will defer to medicine Pulm- CT output is low at 85, CT was kinked... large air leak in pleurovac... unkinked chest tube, redressed... CXR with pneumothorax.... will leave tube to water seal today Renal-creatinine is okay, continue Toradol  prn Dispo- patient stable, CT kinked this morning, fixed and redressed site... CXR with pneumothorax, would leave chest tube on water seal today.... care per primary  Albert Shad, PA-C 8:14 AM 01/04/24    Agree CT to waterseal Continue IS, ambulation  Albert Hill

## 2024-01-05 ENCOUNTER — Inpatient Hospital Stay (HOSPITAL_COMMUNITY)

## 2024-01-05 DIAGNOSIS — J9383 Other pneumothorax: Secondary | ICD-10-CM | POA: Diagnosis not present

## 2024-01-05 LAB — CBC
HCT: 31.1 % — ABNORMAL LOW (ref 39.0–52.0)
Hemoglobin: 10.7 g/dL — ABNORMAL LOW (ref 13.0–17.0)
MCH: 30.9 pg (ref 26.0–34.0)
MCHC: 34.4 g/dL (ref 30.0–36.0)
MCV: 89.9 fL (ref 80.0–100.0)
Platelets: 251 K/uL (ref 150–400)
RBC: 3.46 MIL/uL — ABNORMAL LOW (ref 4.22–5.81)
RDW: 12.4 % (ref 11.5–15.5)
WBC: 9.4 K/uL (ref 4.0–10.5)
nRBC: 0 % (ref 0.0–0.2)

## 2024-01-05 LAB — BASIC METABOLIC PANEL WITH GFR
Anion gap: 8 (ref 5–15)
BUN: 16 mg/dL (ref 6–20)
CO2: 25 mmol/L (ref 22–32)
Calcium: 8.4 mg/dL — ABNORMAL LOW (ref 8.9–10.3)
Chloride: 101 mmol/L (ref 98–111)
Creatinine, Ser: 0.74 mg/dL (ref 0.61–1.24)
GFR, Estimated: >60 mL/min (ref >60)
Glucose, Bld: 149 mg/dL — ABNORMAL HIGH (ref 70–99)
Potassium: 3.8 mmol/L (ref 3.5–5.1)
Sodium: 134 mmol/L — ABNORMAL LOW (ref 135–145)

## 2024-01-05 LAB — GLUCOSE, CAPILLARY
Glucose-Capillary: 123 mg/dL — ABNORMAL HIGH (ref 70–99)
Glucose-Capillary: 113 mg/dL — ABNORMAL HIGH (ref 70–99)
Glucose-Capillary: 119 mg/dL — ABNORMAL HIGH (ref 70–99)
Glucose-Capillary: 97 mg/dL (ref 70–99)
Glucose-Capillary: 122 mg/dL — ABNORMAL HIGH (ref 70–99)

## 2024-01-05 NOTE — Progress Notes (Signed)
 PROGRESS NOTE    Albert Hill  FMW:[id number]  DOB: 03/28/1976  DOA: 12/29/2023 PCP: Lorren Greig PARAS, NP Outpatient Specialists:   Hospital course:  48 year old man w/ hx of COPD, OSA, HTN who presented with recurrent right pneumothorax. After discussion, underwent VATS w R apical bleb resection, pleural abrasion. Pre-op noted to have significant hypoxemia and WOB so left intubated. Extubated on 7/16, transferred care to [facility] on 7/18. Right sided chest tube in place,connected to water seal. CTS on board.    Subjective:  Patient states he is doing better, is glad he is now able to get up out of bed.  Does not really feel like he needs his oxygen  because he is not short of breath.  Noted his O2 saturations dropped to 77 when he is moving around off oxygen  and that he would need to keep his oxygen  on keep O2 sats greater than 90%, patient said he would do that.   Objective: Vitals:   01/05/24 0419 01/05/24 0740 01/05/24 0900 01/05/24 1056  BP: (!) 135/94 (!) 133/99  134/88  Pulse: 79  80   Resp: 16 18 18 18   Temp: 98 F (36.7 C) 98.2 F (36.8 C)  98.6 F (37 C)  TempSrc: Oral Oral  Oral  SpO2: 96% 91%  97%  Weight: 77 kg     Height:        Intake/Output Summary (Last 24 hours) at 01/05/2024 1504 Last data filed at 01/05/2024 0844 Gross per 24 hour  Intake 1188.82 ml  Output 1901 ml  Net -712.18 ml   Filed Weights   01/03/24 1544 01/05/24 0419  Weight: 78.6 kg 77 kg     Exam:  General: Patient looks pretty well is sitting in recliner on side of bed, initially with oxygen  off.  Able to speak in full sentences without difficulty no increased work of breathing. Eyes: sclera anicteric, conjuctiva mild injection bilaterally CVS: S1-S2, regular  Respiratory: Absent breath sounds right base otherwise reasonable air entry, no wheezes GI: NABS, soft, NT  LE: Warm and well-perfused Neuro: A/O x 3,  grossly nonfocal.  Psych: patient is logical and coherent,  judgement and insight appear normal, mood and affect appropriate to situation.  Data Reviewed:  Basic Metabolic Panel: Recent Labs  Lab 01/01/24 0523 01/01/24 0538 01/01/24 1427 01/01/24 1905 01/02/24 0450 01/02/24 0601 01/03/24 0355 01/04/24 0248 01/05/24 0200  NA 137   < >  --    < > 139 139 134* 136 134*  K 4.2   < >  --    < > 3.8 3.8 3.6 4.1 3.8  CL 102  --   --   --  102  --  102 102 101  CO2 26  --   --   --  24  --  23 26 25   GLUCOSE 121*  --   --   --  97  --  135* 104* 149*  BUN 20  --   --   --  21*  --  15 18 16   CREATININE 0.82  --   --   --  0.71  --  0.72 0.81 0.74  CALCIUM 8.8*  --   --   --  8.4*  --  8.5* 8.2* 8.4*  MG  --   --  2.1  --  2.1  --  1.7 1.8  --   PHOS  --   --  3.2  --  2.9  --  3.6 3.8  --    < > = values in this interval not displayed.    CBC: Recent Labs  Lab 12/30/23 2205 12/31/23 1322 01/01/24 0523 01/01/24 0538 01/01/24 1905 01/02/24 0450 01/02/24 0601 01/04/24 0248 01/05/24 0200  WBC 10.2  --  17.5*  --   --  14.2*  --  9.2 9.4  HGB 11.7*   < > 11.8*   < > 11.6* 11.5* 10.9* 10.6* 10.7*  HCT 34.6*   < > 33.5*   < > 34.0* 33.2* 32.0* 31.7* 31.1*  MCV 90.6  --  89.3  --   --  90.5  --  90.3 89.9  PLT 223  --  285  --   --  310  --  244 251   < > = values in this interval not displayed.     Scheduled Meds:  acetaminophen   1,000 mg Oral Q6H   Or   acetaminophen  (TYLENOL ) oral liquid 160 mg/5 mL  1,000 mg Oral Q6H   allopurinol   300 mg Oral Daily   arformoterol   15 mcg Nebulization BID   bisacodyl   10 mg Oral Daily   enoxaparin  (LOVENOX ) injection  40 mg Subcutaneous QHS   feeding supplement  237 mL Oral BID BM   gabapentin   300 mg Oral TID   ketorolac   30 mg Intravenous Q8H   lisinopril   10 mg Oral Daily   nicotine   21 mg Transdermal Daily   pantoprazole   40 mg Oral Daily   polyethylene glycol  17 g Oral Daily   revefenacin   175 mcg Nebulization Daily   senna-docusate  1 tablet Oral BID   Continuous Infusions:   piperacillin -tazobactam (ZOSYN )  IV 3.375 g (01/05/24 0649)   vancomycin  1,500 mg (01/05/24 1016)     Assessment & Plan:   Recurrent spontaneous pneumothorax POA 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 Appreciate ongoing cardiothoracic surgery management of chest tube Improvement in pneumothorax but airleak is persistent CTC asked to discuss possible mini express placement Continue inhaled bronchodilators as warranted  CAP Patient remains on vancomycin  and Zosyn  day #6 today Chest x-ray shows improvement of bibasilar opacities  Ongoing tobacco use Patient states that he stopped smoking the day he was admitted  HTN Continue present management    DVT prophylaxis: Lovenox  Code Status: Full Family Communication: None today     Studies: DG CHEST PORT 1 VIEW Result Date: 01/05/2024 CLINICAL DATA:  48 year old male with bullous emphysema, right side pneumothorax, chest tube. EXAM: PORTABLE CHEST 1 VIEW COMPARISON:  Portable chest yesterday and earlier. FINDINGS: Portable AP semi upright view at 0614 hours. Stable right chest tube coursing to the lung apex. Trace pneumothorax is visible in the right upper lung periphery, pleural edge just deep to the lateral 3rd rib. Improved right lung base ventilation since yesterday with largely resolved indistinct opacity there. Residual along the right heart border. Stable right lung apex mild opacification. Stable cardiac size and mediastinal contours. Negative left lung. Stable visualized osseous structures. IMPRESSION: 1. Stable right chest tube. Trace right pneumothorax. 2. Regressed but not fully resolved right lung base opacity since yesterday. No areas of worsening ventilation. Electronically Signed   By: VEAR Hurst M.D.   On: 01/05/2024 08:28   DG Chest Port 1 View Result Date: 01/04/2024 EXAM: 1 VIEW XRAY OF THE CHEST 01/04/2024 06:07:45 AM COMPARISON: 01/03/2024 CLINICAL HISTORY: Pneumothorax FINDINGS: LUNGS AND PLEURA: Right chest tube is again  noted with tip terminating over the right apex. Persistent small right pneumothorax  with apical and basilar components which are unchanged from the previous exam. Progressive opacification within the medial and lateral right lower lung. Mild subsegmental atelectasis noted in the left base. HEART AND MEDIASTINUM: The cardiomediastinal contours are stable. BONES AND SOFT TISSUES: No acute osseous abnormality. IMPRESSION: 1. Persistent small right pneumothorax with apical and basilar components, unchanged from the previous exam. 2. Progressive opacification within the medial and lateral right lower lung. 3. Mild subsegmental atelectasis in the left base. Electronically signed by: Waddell Calk MD 01/04/2024 08:36 AM EDT RP Workstation: [id number]    Principal Problem:   Recurrent spontaneous pneumothorax Active Problems:   Protein-calorie malnutrition, severe     Lonette Stevison Vangie Pike, [facility]  If 7PM-7AM, please contact night-coverage [email or web address]   LOS: 7 days

## 2024-01-05 NOTE — Progress Notes (Addendum)
      [address]       [place] [address]             [phone]      5 Days Post-Op Procedure(s) (LRB): ROBOTIC ASSISTED RIGHT VIDEO ASSISTED THORACOSCOPIC SURGERY, BLEB RESECTION, PLEURAL ABRASION (Right) BLOCK, NERVE, INTERCOSTAL (Right)  Subjective:  Patient up moving around room.  He has no specific complaints.  He would really like to go home.  Objective: Vital signs in last 24 hours: Temp:  [98 F (36.7 C)-98.6 F (37 C)] 98.2 F (36.8 C) (07/19 0740) Pulse Rate:  [79-95] 79 (07/19 0419) Cardiac Rhythm: Normal sinus rhythm (07/19 0700) Resp:  [13-18] 18 (07/19 0740) BP: (131-156)/(94-99) 133/99 (07/19 0740) SpO2:  [91 %-96 %] 91 % (07/19 0740) Weight:  [77 kg] 77 kg (07/19 0419)  Intake/Output from previous day: 07/18 0701 - 07/19 0700 In: 1337.2 [P.O.:220; IV Piggyback:1117.2] Out: 1891 [Urine:1800; Stool:1; Chest Tube:90] Intake/Output this shift: Total I/O In: 240 [P.O.:240] Out: -   General appearance: alert, cooperative, and no distress Heart: regular rate and rhythm Lungs: clear to auscultation bilaterally Wound: clean and dry  Lab Results: Recent Labs    01/04/24 0248 01/05/24 0200  WBC 9.2 9.4  HGB 10.6* 10.7*  HCT 31.7* 31.1*  PLT 244 251   BMET:  Recent Labs    01/04/24 0248 01/05/24 0200  NA 136 134*  K 4.1 3.8  CL 102 101  CO2 26 25  GLUCOSE 104* 149*  BUN 18 16  CREATININE 0.81 0.74  CALCIUM 8.2* 8.4*    PT/INR: No results for input(s): LABPROT, INR in the last 72 hours. ABG    Component Value Date/Time   PHART 7.447 01/02/2024 0601   HCO3 24.9 01/02/2024 0601   TCO2 26 01/02/2024 0601   ACIDBASEDEF 1.0 12/31/2023 1540   O2SAT 97 01/02/2024 0601   CBG (last 3)  Recent Labs    01/04/24 2338 01/05/24 0414 01/05/24 0743  GLUCAP 119* 123* 113*    Assessment/Plan: S/P Procedure(s) (LRB): ROBOTIC ASSISTED RIGHT VIDEO ASSISTED THORACOSCOPIC SURGERY, BLEB RESECTION, PLEURAL ABRASION  (Right) BLOCK, NERVE, INTERCOSTAL (Right)  Pulm- CT on water seal, CXR today shows improvement in pneumothorax... air leak persists... could place to Mini Express, will discuss with Dr. Shyrl ID- remains on IV ABX for pneumonia treatment these are due to stop tomorrow  Care per medicine  LOS: 7 days   Rocky Shad, PA-C 01/05/2024   Agree CT to water seal  Linnie MALVA Shyrl, MD

## 2024-01-05 NOTE — Plan of Care (Signed)
  Problem: Education: Goal: Knowledge of General Education information will improve Description: Including pain rating scale, medication(s)/side effects and non-pharmacologic comfort measures Outcome: Progressing   Problem: Health Behavior/Discharge Planning: Goal: Ability to manage health-related needs will improve Outcome: Progressing   Problem: Clinical Measurements: Goal: Ability to maintain clinical measurements within normal limits will improve Outcome: Progressing Goal: Will remain free from infection Outcome: Progressing Goal: Diagnostic test results will improve Outcome: Progressing Goal: Respiratory complications will improve Outcome: Progressing Goal: Cardiovascular complication will be avoided Outcome: Progressing   Problem: Activity: Goal: Risk for activity intolerance will decrease Outcome: Progressing   Problem: Nutrition: Goal: Adequate nutrition will be maintained Outcome: Progressing   Problem: Coping: Goal: Level of anxiety will decrease Outcome: Progressing   Problem: Elimination: Goal: Will not experience complications related to bowel motility Outcome: Progressing Goal: Will not experience complications related to urinary retention Outcome: Progressing   Problem: Pain Managment: Goal: General experience of comfort will improve and/or be controlled Outcome: Progressing   Problem: Safety: Goal: Ability to remain free from injury will improve Outcome: Progressing   Problem: Skin Integrity: Goal: Risk for impaired skin integrity will decrease Outcome: Progressing   Problem: Education: Goal: Knowledge of disease or condition will improve Outcome: Progressing Goal: Knowledge of the prescribed therapeutic regimen will improve Outcome: Progressing   Problem: Activity: Goal: Risk for activity intolerance will decrease Outcome: Progressing   Problem: Cardiac: Goal: Will achieve and/or maintain hemodynamic stability Outcome: Progressing    Problem: Clinical Measurements: Goal: Postoperative complications will be avoided or minimized Outcome: Progressing   Problem: Respiratory: Goal: Respiratory status will improve Outcome: Progressing   Problem: Pain Management: Goal: Pain level will decrease Outcome: Progressing   Problem: Skin Integrity: Goal: Wound healing without signs and symptoms infection will improve Outcome: Progressing   Problem: Safety: Goal: Non-violent Restraint(s) Outcome: Progressing

## 2024-01-05 NOTE — Plan of Care (Signed)
   Problem: Education: Goal: Knowledge of General Education information will improve Description Including pain rating scale, medication(s)/side effects and non-pharmacologic comfort measures Outcome: Progressing   Problem: Health Behavior/Discharge Planning: Goal: Ability to manage health-related needs will improve Outcome: Progressing

## 2024-01-06 ENCOUNTER — Inpatient Hospital Stay (HOSPITAL_COMMUNITY)

## 2024-01-06 DIAGNOSIS — J9383 Other pneumothorax: Secondary | ICD-10-CM | POA: Diagnosis not present

## 2024-01-06 LAB — CBC
HCT: 30.2 % — ABNORMAL LOW (ref 39.0–52.0)
Hemoglobin: 10.2 g/dL — ABNORMAL LOW (ref 13.0–17.0)
MCH: 31 pg (ref 26.0–34.0)
MCHC: 33.8 g/dL (ref 30.0–36.0)
MCV: 91.8 fL (ref 80.0–100.0)
Platelets: 245 K/uL (ref 150–400)
RBC: 3.29 MIL/uL — ABNORMAL LOW (ref 4.22–5.81)
RDW: 12.6 % (ref 11.5–15.5)
WBC: 8.3 K/uL (ref 4.0–10.5)
nRBC: 0 % (ref 0.0–0.2)

## 2024-01-06 LAB — BASIC METABOLIC PANEL WITH GFR
Anion gap: 11 (ref 5–15)
BUN: 16 mg/dL (ref 6–20)
CO2: 24 mmol/L (ref 22–32)
Calcium: 8.5 mg/dL — ABNORMAL LOW (ref 8.9–10.3)
Chloride: 102 mmol/L (ref 98–111)
Creatinine, Ser: 0.67 mg/dL (ref 0.61–1.24)
GFR, Estimated: >60 mL/min (ref >60)
Glucose, Bld: 106 mg/dL — ABNORMAL HIGH (ref 70–99)
Potassium: 4 mmol/L (ref 3.5–5.1)
Sodium: 137 mmol/L (ref 135–145)

## 2024-01-06 LAB — GLUCOSE, CAPILLARY
Glucose-Capillary: 109 mg/dL — ABNORMAL HIGH (ref 70–99)
Glucose-Capillary: 112 mg/dL — ABNORMAL HIGH (ref 70–99)
Glucose-Capillary: 94 mg/dL (ref 70–99)

## 2024-01-06 NOTE — Progress Notes (Addendum)
      [address] [place] [address]             [phone]       6 Days Post-Op Procedure(s) (LRB): ROBOTIC ASSISTED RIGHT VIDEO ASSISTED THORACOSCOPIC SURGERY, BLEB RESECTION, PLEURAL ABRASION (Right) BLOCK, NERVE, INTERCOSTAL (Right)  Subjective:  Patient without specific complaints. Does state he has more pain in the morning after sleeping all night and not taking medication.  He disconnected this chest tube from the pleurovac yesterday. I explained to patient that he should not manipulate or disconnect his tube from the pleurovac as he can cause harm and increase risk of pneumothorax.  Patient educated that he can ambulate with the tube hooked to the pleurovac.  He voiced agreement.  Objective: Vital signs in last 24 hours: Temp:  [98.1 F (36.7 C)-98.6 F (37 C)] 98.3 F (36.8 C) (07/20 0714) Pulse Rate:  [78-95] 95 (07/20 0714) Cardiac Rhythm: Normal sinus rhythm (07/19 1904) Resp:  [16-24] 20 (07/20 0714) BP: (114-143)/(87-98) 114/95 (07/20 0714) SpO2:  [92 %-97 %] 94 % (07/20 0729) Weight:  [76.3 kg] 76.3 kg (07/20 0500)  Intake/Output from previous day: 07/19 0701 - 07/20 0700 In: 480 [P.O.:480] Out: 50 [Chest Tube:50]  General appearance: alert, cooperative, and no distress Heart: regular rate and rhythm Lungs: wheezes bilaterally and mild Abdomen: soft, non-tender; bowel sounds normal; no masses,  no organomegaly Extremities: extremities normal, atraumatic, no cyanosis or edema Wound: clean and dry  Lab Results: Recent Labs    01/05/24 0200 01/06/24 0235  WBC 9.4 8.3  HGB 10.7* 10.2*  HCT 31.1* 30.2*  PLT 251 245   BMET:  Recent Labs    01/05/24 0200 01/06/24 0235  NA 134* 137  K 3.8 4.0  CL 101 102  CO2 25 24  GLUCOSE 149* 106*  BUN 16 16  CREATININE 0.74 0.67  CALCIUM 8.4* 8.5*    PT/INR: No results for input(s): LABPROT, INR in the last 72 hours. ABG    Component Value Date/Time   PHART 7.447 01/02/2024  0601   HCO3 24.9 01/02/2024 0601   TCO2 26 01/02/2024 0601   ACIDBASEDEF 1.0 12/31/2023 1540   O2SAT 97 01/02/2024 0601   CBG (last 3)  Recent Labs    01/05/24 2027 01/06/24 0020 01/06/24 0448  GLUCAP 122* 112* 94    Assessment/Plan: S/P Procedure(s) (LRB): ROBOTIC ASSISTED RIGHT VIDEO ASSISTED THORACOSCOPIC SURGERY, BLEB RESECTION, PLEURAL ABRASION (Right) BLOCK, NERVE, INTERCOSTAL (Right)  Pulm- CT on waterseal, large air leak persists... dressing changed.. CXR remains stable with residual right sided pneumothorax   As discussed with Dr. Shyrl yesterday 7/19, he does not feel patient is appropriate for a Mini Express.  He will need to remain in hospital until air leak resolves.   Care per medicine   LOS: 8 days    Rocky Shad, PA-C 01/06/2024   Continue CT management  Chaselyn Nanney MALVA Shyrl

## 2024-01-06 NOTE — Plan of Care (Signed)
   Problem: Education: Goal: Knowledge of General Education information will improve Description Including pain rating scale, medication(s)/side effects and non-pharmacologic comfort measures Outcome: Progressing   Problem: Health Behavior/Discharge Planning: Goal: Ability to manage health-related needs will improve Outcome: Progressing

## 2024-01-06 NOTE — Progress Notes (Signed)
 Mobility Specialist Progress Note;   01/06/24 0857  Mobility  Activity Ambulated with assistance in hallway  Level of Assistance Modified independent, requires aide device or extra time  Assistive Device None  Distance Ambulated (ft) 800 ft  Activity Response Tolerated well  Mobility Referral Yes  Mobility visit 1 Mobility  Mobility Specialist Start Time (ACUTE ONLY) 0857  Mobility Specialist Stop Time (ACUTE ONLY) 0917  Mobility Specialist Time Calculation (min) (ACUTE ONLY) 20 min   Pt eager for mobility. On 5LO2 upon arrival. Required no physical assistance during ambulation, ModI. Ambulated on 3LO2 to maintain SPO2 92%>. No c/o when asked. Pt returned back to chair and left with all needs met, call bell in reach.   Lauraine Erm Mobility Specialist Please contact via SecureChat or [employer] [phone]

## 2024-01-06 NOTE — Plan of Care (Signed)

## 2024-01-06 NOTE — Progress Notes (Addendum)
 PROGRESS NOTE    Albert Hill  FMW:[id number] DOB: 12-Aug-1975 DOA: 12/29/2023 PCP: Lorren Greig PARAS, NP   Brief Narrative:  48 year old man w/ hx of COPD, OSA, HTN who presented with recurrent right pneumothorax. After discussion, underwent VATS w R apical bleb resection, pleural abrasion. Pre-op noted to have significant hypoxemia and WOB so left intubated. Extubated on 7/16, transferred care to [facility] on 7/18. Right sided chest tube in place,connected to water seal. CTS on board. He has persistent air leak.   Assessment & Plan:  Principal Problem:   Recurrent spontaneous pneumothorax Active Problems:   Protein-calorie malnutrition, severe   Recurrent Pneumothorax,POA:  In the setting of bullous emphysema, continued tobacco abuse S/p Right sided VATS Extubated on 7/16. Right sided chest tube in place,connected to water seal. Cardiothoracic surgery on board and managing chest tube. He has Persistent air leak F/u repeat CXR 7/20: No definite pneumothorax seen.  COPD: Not in exacerbation: Out patient follow up with pulmonology. We spoke about the importance of tobacco cessation. Prn inhalational bronchodilators  Hypertension: Started Lisinopril  10 mg on 7/18. Close BP Monitoring.  Tobacco abuse: Advised strongly regarding tobacco cessation.  Disposition: Home, IADL.   DVT prophylaxis: enoxaparin  (LOVENOX ) injection 40 mg Start: 01/01/24 2200 SCD's Start: 12/31/23 1518     Code Status: Full Code Family Communication:   Status is: Inpatient Remains inpatient appropriate because: Chest tube in place   Subjective:  Denies chest pain or shortness of breath.He wants to go home. He still has persistent air leak and I discussed that with him that he will need to stay in the hospital until his air leak resolves.  Examination:  General exam: Appears calm and comfortable  Respiratory system: Clear to auscultation. Respiratory effort normal. Right sided CT in  place Cardiovascular system: S1 & S2 heard, RRR. No JVD, murmurs, rubs, gallops or clicks. No pedal edema. Gastrointestinal system: Abdomen is nondistended, soft and nontender. No organomegaly or masses felt. Normal bowel sounds heard. Central nervous system: Alert and oriented. No focal neurological deficits. Extremities: Symmetric 5 x 5 power. Skin: No rashes, lesions or ulcers Psychiatry: Judgement and insight appear normal. Mood & affect appropriate.      Diet Orders (From admission, onward)     Start     Ordered   01/02/24 1317  Diet regular Room service appropriate? Yes; Fluid consistency: Thin  Diet effective now       Question Answer Comment  Room service appropriate? Yes   Fluid consistency: Thin      01/02/24 1317            Objective: Vitals:   01/06/24 0449 01/06/24 0500 01/06/24 0714 01/06/24 0729  BP: 129/87  (!) 114/95   Pulse: 78 78 95   Resp: 18  20   Temp: 98.1 F (36.7 C)  98.3 F (36.8 C)   TempSrc: Oral  Oral   SpO2: 94% 92% 93% 94%  Weight:  76.3 kg    Height:        Intake/Output Summary (Last 24 hours) at 01/06/2024 0847 Last data filed at 01/06/2024 0811 Gross per 24 hour  Intake 240 ml  Output 110 ml  Net 130 ml   Filed Weights   01/03/24 1544 01/05/24 0419 01/06/24 0500  Weight: 78.6 kg 77 kg 76.3 kg    Scheduled Meds:  allopurinol   300 mg Oral Daily   arformoterol   15 mcg Nebulization BID   bisacodyl   10 mg Oral Daily  enoxaparin  (LOVENOX ) injection  40 mg Subcutaneous QHS   feeding supplement  237 mL Oral BID BM   gabapentin   300 mg Oral TID   ketorolac   30 mg Intravenous Q8H   lisinopril   10 mg Oral Daily   nicotine   21 mg Transdermal Daily   pantoprazole   40 mg Oral Daily   polyethylene glycol  17 g Oral Daily   revefenacin   175 mcg Nebulization Daily   senna-docusate  1 tablet Oral BID   Continuous Infusions:  piperacillin -tazobactam (ZOSYN )  IV 3.375 g (01/06/24 9367)    Nutritional status Signs/Symptoms:  moderate fat depletion, moderate muscle depletion, severe muscle depletion Interventions: Tube feeding Body mass index is 24.14 kg/m.  Data Reviewed:   CBC: Recent Labs  Lab 01/01/24 0523 01/01/24 0538 01/02/24 0450 01/02/24 0601 01/04/24 0248 01/05/24 0200 01/06/24 0235  WBC 17.5*  --  14.2*  --  9.2 9.4 8.3  HGB 11.8*   < > 11.5* 10.9* 10.6* 10.7* 10.2*  HCT 33.5*   < > 33.2* 32.0* 31.7* 31.1* 30.2*  MCV 89.3  --  90.5  --  90.3 89.9 91.8  PLT 285  --  310  --  244 251 245   < > = values in this interval not displayed.   Basic Metabolic Panel: Recent Labs  Lab 01/01/24 1427 01/01/24 1905 01/02/24 0450 01/02/24 0601 01/03/24 0355 01/04/24 0248 01/05/24 0200 01/06/24 0235  NA  --    < > 139 139 134* 136 134* 137  K  --    < > 3.8 3.8 3.6 4.1 3.8 4.0  CL  --   --  102  --  102 102 101 102  CO2  --   --  24  --  23 26 25 24   GLUCOSE  --   --  97  --  135* 104* 149* 106*  BUN  --   --  21*  --  15 18 16 16   CREATININE  --   --  0.71  --  0.72 0.81 0.74 0.67  CALCIUM  --   --  8.4*  --  8.5* 8.2* 8.4* 8.5*  MG 2.1  --  2.1  --  1.7 1.8  --   --   PHOS 3.2  --  2.9  --  3.6 3.8  --   --    < > = values in this interval not displayed.   GFR: Estimated Creatinine Clearance: 115.3 mL/min (by C-G formula based on SCr of 0.67 mg/dL). Liver Function Tests: Recent Labs  Lab 12/30/23 2205 01/02/24 0450  AST 12* 45*  ALT 12 34  ALKPHOS 75 78  BILITOT 0.5 0.7  PROT 5.7* 5.4*  ALBUMIN  2.9* 2.3*   No results for input(s): LIPASE, AMYLASE in the last 168 hours. No results for input(s): AMMONIA in the last 168 hours. Coagulation Profile: Recent Labs  Lab 12/30/23 2205  INR 1.0   Cardiac Enzymes: No results for input(s): CKTOTAL, CKMB, CKMBINDEX, TROPONINI in the last 168 hours. BNP (last 3 results) No results for input(s): PROBNP in the last 8760 hours. HbA1C: No results for input(s): HGBA1C in the last 72 hours. CBG: Recent Labs  Lab  01/05/24 1619 01/05/24 2027 01/06/24 0020 01/06/24 0448 01/06/24 0744  GLUCAP 97 122* 112* 94 109*   Lipid Profile: No results for input(s): CHOL, HDL, LDLCALC, TRIG, CHOLHDL, LDLDIRECT in the last 72 hours. Thyroid Function Tests: No results for input(s): TSH, T4TOTAL, FREET4, T3FREE, THYROIDAB in  the last 72 hours. Anemia Panel: No results for input(s): VITAMINB12, FOLATE, FERRITIN, TIBC, IRON, RETICCTPCT in the last 72 hours. Sepsis Labs: No results for input(s): PROCALCITON, LATICACIDVEN in the last 168 hours.  Recent Results (from the past 240 hours)  Surgical pcr screen     Status: Abnormal   Collection Time: 12/31/23  9:14 AM   Specimen: Nasal Mucosa; Nasal Swab  Result Value Ref Range Status   MRSA, PCR NEGATIVE NEGATIVE Final   Staphylococcus aureus POSITIVE (A) NEGATIVE Final    Comment: (NOTE) The Xpert SA Assay (FDA approved for NASAL specimens in patients 84 years of age and older), is one component of a comprehensive surveillance program. It is not intended to diagnose infection nor to guide or monitor treatment. Performed at [facility], [address] [place], [place] [address]          Radiology Studies: DG CHEST PORT 1 VIEW Result Date: 01/05/2024 CLINICAL DATA:  48 year old male with bullous emphysema, right side pneumothorax, chest tube. EXAM: PORTABLE CHEST 1 VIEW COMPARISON:  Portable chest yesterday and earlier. FINDINGS: Portable AP semi upright view at 0614 hours. Stable right chest tube coursing to the lung apex. Trace pneumothorax is visible in the right upper lung periphery, pleural edge just deep to the lateral 3rd rib. Improved right lung base ventilation since yesterday with largely resolved indistinct opacity there. Residual along the right heart border. Stable right lung apex mild opacification. Stable cardiac size and mediastinal contours. Negative left lung. Stable visualized osseous structures.  IMPRESSION: 1. Stable right chest tube. Trace right pneumothorax. 2. Regressed but not fully resolved right lung base opacity since yesterday. No areas of worsening ventilation. Electronically Signed   By: VEAR Hurst M.D.   On: 01/05/2024 08:28      LOS: 8 days   Time spent= 40 mins    Deliliah Room, MD [facility]  If 7PM-7AM, please contact night-coverage  01/06/2024, 8:47 AM

## 2024-01-07 ENCOUNTER — Inpatient Hospital Stay (HOSPITAL_COMMUNITY)

## 2024-01-07 DIAGNOSIS — J9383 Other pneumothorax: Secondary | ICD-10-CM | POA: Diagnosis not present

## 2024-01-07 LAB — BASIC METABOLIC PANEL WITH GFR
Anion gap: 9 (ref 5–15)
BUN: 15 mg/dL (ref 6–20)
CO2: 27 mmol/L (ref 22–32)
Calcium: 8.7 mg/dL — ABNORMAL LOW (ref 8.9–10.3)
Chloride: 100 mmol/L (ref 98–111)
Creatinine, Ser: 0.62 mg/dL (ref 0.61–1.24)
GFR, Estimated: >60 mL/min (ref >60)
Glucose, Bld: 108 mg/dL — ABNORMAL HIGH (ref 70–99)
Potassium: 3.8 mmol/L (ref 3.5–5.1)
Sodium: 136 mmol/L (ref 135–145)

## 2024-01-07 LAB — CBC
HCT: 30.3 % — ABNORMAL LOW (ref 39.0–52.0)
Hemoglobin: 10.7 g/dL — ABNORMAL LOW (ref 13.0–17.0)
MCH: 31.4 pg (ref 26.0–34.0)
MCHC: 35.3 g/dL (ref 30.0–36.0)
MCV: 88.9 fL (ref 80.0–100.0)
Platelets: 272 K/uL (ref 150–400)
RBC: 3.41 MIL/uL — ABNORMAL LOW (ref 4.22–5.81)
RDW: 12.5 % (ref 11.5–15.5)
WBC: 6.8 K/uL (ref 4.0–10.5)
nRBC: 0 % (ref 0.0–0.2)

## 2024-01-07 MED ORDER — LISINOPRIL 20 MG PO TABS
20.0000 mg | ORAL_TABLET | Freq: Every day | ORAL | Status: DC
  Administered 2024-01-08 – 2024-01-11 (×4): 20 mg via ORAL
  Filled 2024-01-07 (×4): qty 1

## 2024-01-07 MED ORDER — HYDRALAZINE HCL 20 MG/ML IJ SOLN
10.0000 mg | Freq: Four times a day (QID) | INTRAMUSCULAR | Status: DC | PRN
  Administered 2024-01-07 – 2024-01-08 (×2): 10 mg via INTRAVENOUS
  Filled 2024-01-07 (×2): qty 1

## 2024-01-07 MED ORDER — ADULT MULTIVITAMIN W/MINERALS CH
1.0000 | ORAL_TABLET | Freq: Every day | ORAL | Status: DC
  Administered 2024-01-07 – 2024-01-15 (×9): 1 via ORAL
  Filled 2024-01-07 (×9): qty 1

## 2024-01-07 NOTE — Progress Notes (Addendum)
      [address] [place] [address]             [phone]    7 Days Post-Op Procedure(s) (LRB): ROBOTIC ASSISTED RIGHT VIDEO ASSISTED THORACOSCOPIC SURGERY, BLEB RESECTION, PLEURAL ABRASION (Right) BLOCK, NERVE, INTERCOSTAL (Right)  Subjective:  Patient remains frustrated about being in the hospital.  Ambulating independently.  Objective: Vital signs in last 24 hours: Temp:  [97.7 F (36.5 C)-98.6 F (37 C)] 98.6 F (37 C) (07/21 0717) Pulse Rate:  [74-96] 88 (07/21 0717) Cardiac Rhythm: Normal sinus rhythm (07/21 0728) Resp:  [16-20] 17 (07/21 0717) BP: (135-161)/(94-108) 145/94 (07/21 0717) SpO2:  [89 %-95 %] 89 % (07/21 0717) Weight:  [75.8 kg] 75.8 kg (07/21 0500)  Intake/Output from previous day: 07/20 0701 - 07/21 0700 In: 480 [P.O.:480] Out: 230 [Chest Tube:230]  General appearance: alert, cooperative, and no distress Heart: regular rate and rhythm Lungs: clear to auscultation bilaterally Abdomen: soft, non-tender; bowel sounds normal; no masses,  no organomegaly Extremities: extremities normal, atraumatic, no cyanosis or edema Wound: clean and dry  Lab Results: Recent Labs    01/06/24 0235 01/07/24 0228  WBC 8.3 6.8  HGB 10.2* 10.7*  HCT 30.2* 30.3*  PLT 245 272   BMET:  Recent Labs    01/06/24 0235 01/07/24 0228  NA 137 136  K 4.0 3.8  CL 102 100  CO2 24 27  GLUCOSE 106* 108*  BUN 16 15  CREATININE 0.67 0.62  CALCIUM 8.5* 8.7*    PT/INR: No results for input(s): LABPROT, INR in the last 72 hours. ABG    Component Value Date/Time   PHART 7.447 01/02/2024 0601   HCO3 24.9 01/02/2024 0601   TCO2 26 01/02/2024 0601   ACIDBASEDEF 1.0 12/31/2023 1540   O2SAT 97 01/02/2024 0601   CBG (last 3)  Recent Labs    01/06/24 0020 01/06/24 0448 01/06/24 0744  GLUCAP 112* 94 109*    Assessment/Plan: S/P Procedure(s) (LRB): ROBOTIC ASSISTED RIGHT VIDEO ASSISTED THORACOSCOPIC SURGERY, BLEB RESECTION, PLEURAL  ABRASION (Right) BLOCK, NERVE, INTERCOSTAL (Right)  Pulm- CT on waterseal, + air leak persists.. CXR w/o pneumothorax  Continue chest tube, care per medicine   LOS: 9 days    Albert Shad, Albert Hill 01/07/2024 7:41 AM   Chart reviewed, patient examined, agree with above.  CXR looks ok with no definite ptx but some atelectasis in RLL. There is still an air leak with deep breathing and coughing. Albert Hill is full. Will have nurses replace. Continue to water seal. CXR in am.

## 2024-01-07 NOTE — Progress Notes (Signed)
 Physical Therapy Treatment Patient Details Name: Albert Hill MRN: [id number] DOB: 1976-06-15 Today's Date: 01/07/2024   History of Present Illness 48 year old man admitted 7/14  presenting with recurrent right pneumothorax.  After discussion, underwent VATS with R apical bleb resection, pleural abrasion.  Pre-op noted to have significant hypoxemia and WOB so left intubated post op for a more slow wean.  VDRF 7/14-7/16.  PMH: COPD, OSA, HTN    PT Comments  Pt received in chair, agreeable to therapy session and with good participation and improved tolerance for gait and stair negotiation. Pt progressed to mostly modI for functional mobility tasks and reports no increased fatigue with exertion. SpO2 88% and above on RA with simulated flight of stairs, and 92% and above on RA for hallway gait trial without need for assistive device. Pt appears to have met PT goals, discussed with supervising PT Albert Hill and patient; anticipate no DME needs as pt has been mobilizing community distances past couple days without needing seated rest breaks or assist with balance. Pt will benefit from frequent mobilization OOB with mobility and nursing staff as long as VSS. Vital Signs  BP (!) 150/102 (prior to ambulating)  BP Location Left Arm  BP Method Automatic  Patient Position (if appropriate) Sitting   Vital Signs  Pulse Rate 99  Pulse Rate Source Monitor  BP (!) 171/115 (post-exertion, RN notified x2 via secure chat)  BP Location Left Arm  BP Method Automatic  Patient Position (if appropriate) Sitting  Oxygen  Therapy  SpO2 97 %  O2 Device Room Air  Patient Activity (if Appropriate) In chair     If plan is discharge home, recommend the following: A little help with bathing/dressing/bathroom;Assistance with cooking/housework   Can travel by private vehicle        Equipment Recommendations  None recommended by PT   Recommendations for Other Services       Precautions / Restrictions  Precautions Precautions: None Recall of Precautions/Restrictions: Intact Precaution/Restrictions Comments: chest tube, watch BP/HR Restrictions Weight Bearing Restrictions Per Provider Order: No     Mobility  Bed Mobility               General bed mobility comments: pt reports no difficulty, pt received and left sitting in chair    Transfers Overall transfer level: Needs assistance Equipment used: Rolling walker (2 wheels) Transfers: Sit to/from Stand Sit to Stand: Modified independent (Device/Increase time)           General transfer comment: pt with good awareness of lines/CT, PTA assisting PRN due to extra SpO2 sensor cord. no lift or steadying assist needed    Ambulation/Gait Ambulation/Gait assistance: Modified independent (Device/Increase time) Gait Distance (Feet): 500 Feet Assistive device: None Gait Pattern/deviations: Step-through pattern Gait velocity: fair cadence given lines/slightly elevated HR     General Gait Details: SpO2 92% and above on RA with exertion (with earlobe sensor as his fingertips were cold) and HR slightly elevated to ~116 bpm with standing activity, decreases to ~99 bpm when seated. No LOB or instability observed this date, PTA assisting with lines.   Stairs Stairs: Yes Stairs assistance: Modified independent (Device/Increase time) Stair Management: One rail Left, Step to pattern, Forwards, Backwards Number of Stairs: 10 General stair comments: single 7 step in room x10 reps to assess endurance/safety and SpO2, pt desat to 88% on RA briefly post-exertion, but with standing rest break, SpO2 improves to >92% wtihin 1 minute and remains WFL for gait training in hallway  Wheelchair Mobility     Tilt Bed    Modified Rankin (Stroke Patients Only)       Balance Overall balance assessment: Needs assistance Sitting-balance support: No upper extremity supported, Feet supported Sitting balance-Leahy Scale: Good     Standing  balance support: During functional activity, No upper extremity supported Standing balance-Leahy Scale: Good Standing balance comment: unsupported wtihout LOB today                            Communication Communication Communication: No apparent difficulties  Cognition Arousal: Alert Behavior During Therapy: WFL for tasks assessed/performed, Anxious   PT - Cognitive impairments: No apparent impairments                       PT - Cognition Comments: Hyperverbose, pt seems to have good safety awareness and motivated to progress. Following commands: Intact      Cueing Cueing Techniques: Verbal cues  Exercises Other Exercises Other Exercises: PTA discussed diaphragmatic/pursed-lip breathing and mindfulness techniques for him as pt reports his BP has been much higher past few days than normal. PTA also recommending he check BP in AM and notify PCP if it is elevated and pace his activity PRN if BP elevated. Handout on energy conservation and education on activity pacing.    General Comments General comments (skin integrity, edema, etc.): see gait comments; BP elevated, see comments above, RN secure chatted to notify and PTA entered pre/post seated BP into flowsheet      Pertinent Vitals/Pain Pain Assessment Pain Assessment: Faces Faces Pain Scale: Hurts little more Pain Location: chest and right side Pain Descriptors / Indicators: Aching, Discomfort, Grimacing, Guarding Pain Intervention(s): Monitored during session, Repositioned    Home Living                          Prior Function            PT Goals (current goals can now be found in the care plan section) Acute Rehab PT Goals Patient Stated Goal: To get better and stop smoking. PT Goal Formulation: With patient Time For Goal Achievement: 01/16/24 Progress towards PT goals: Progressing toward goals    Frequency    Min 2X/week      PT Plan      Co-evaluation               AM-PAC PT 6 Clicks Mobility   Outcome Measure  Help needed turning from your back to your side while in a flat bed without using bedrails?: None Help needed moving from lying on your back to sitting on the side of a flat bed without using bedrails?: None Help needed moving to and from a bed to a chair (including a wheelchair)?: None Help needed standing up from a chair using your arms (e.g., wheelchair or bedside chair)?: None Help needed to walk in hospital room?: None Help needed climbing 3-5 steps with a railing? : None 6 Click Score: 24    End of Session Equipment Utilized During Treatment: Oxygen  Activity Tolerance: Patient tolerated treatment well Patient left: in bed;with call bell/phone within reach;with bed alarm set;with family/visitor present (Sitting EOB as found) Nurse Communication: Mobility status PT Visit Diagnosis: Muscle weakness (generalized) (M62.81);Difficulty in walking, not elsewhere classified (R26.2);Unsteadiness on feet (R26.81);Other abnormalities of gait and mobility (R26.89)     Time: 8692-8666 PT Time Calculation (min) (ACUTE ONLY): 26 min  Charges:    $Gait Training: 8-22 mins $Therapeutic Activity: 8-22 mins PT General Charges $$ ACUTE PT VISIT: 1 Visit                     Lakyn Mantione P., PTA Acute Rehabilitation Services Secure Chat Preferred 9a-5:30pm Office: [phone]    [facility] 01/07/2024, 3:17 PM

## 2024-01-07 NOTE — TOC Progression Note (Signed)
 Transition of Care [facility]) - Progression Note    Patient Details  Name: Albert Hill MRN: [id number] Date of Birth: February 05, 1976  [facility]) CM/SW Contact  [name], RN Phone Number: 01/07/2024, 2:09 PM  Clinical Narrative:    Patient declines substance abuse rehab and OP PT.    Expected Discharge Plan: Home w Home Health Services Barriers to Discharge: Continued Medical Work up  Expected Discharge Plan and Services In-house Referral: NA Discharge Planning Services: CM Consult   Living arrangements for the past 2 months: Single Family Home (Sister Michaelle states patient cannot return to [place] with his girlfriend)                   DME Agency: NA                   Social Determinants of Health (SDOH) Interventions SDOH Screenings   Food Insecurity: No Food Insecurity (12/30/2023)  Recent Concern: Food Insecurity - Food Insecurity Present (12/24/2023)  Housing: Low Risk  (12/30/2023)  Recent Concern: Housing - High Risk (12/24/2023)  Transportation Needs: No Transportation Needs (12/30/2023)  Recent Concern: Transportation Needs - Unmet Transportation Needs (12/24/2023)  Utilities: Not At Risk (12/30/2023)  Alcohol Screen: Low Risk  (12/24/2023)  Depression (PHQ2-9): High Risk (12/24/2023)  Financial Resource Strain: High Risk (12/24/2023)  Physical Activity: Inactive (12/24/2023)  Social Connections: Moderately Integrated (12/24/2023)  Stress: Stress Concern Present (12/24/2023)  Tobacco Use: High Risk (12/31/2023)  Health Literacy: Adequate Health Literacy (12/24/2023)    Readmission Risk Interventions     No data to display

## 2024-01-07 NOTE — Progress Notes (Signed)
 PROGRESS NOTE    Albert Hill  FMW:[id number] DOB: 1976/02/11 DOA: 12/29/2023 PCP: Lorren Greig PARAS, NP   Brief Narrative:  48 year old man w/ hx of COPD, OSA, HTN who presented with recurrent right pneumothorax. After discussion, underwent VATS w R apical bleb resection, pleural abrasion. Pre-op noted to have significant hypoxemia and WOB so left intubated. Extubated on 7/16, transferred care to [facility] on 7/18. Right sided chest tube in place,connected to water seal. CTS on board. He has persistent air leak.   Assessment & Plan:  Principal Problem:   Recurrent spontaneous pneumothorax Active Problems:   Protein-calorie malnutrition, severe   Recurrent Pneumothorax,POA:  In the setting of bullous emphysema, continued tobacco abuse S/p Right sided VATS Extubated on 7/16. Right sided chest tube in place,connected to water seal. Cardiothoracic surgery on board and managing chest tube. He has Persistent air leak F/u repeat CXR 7/20: No definite pneumothorax seen.  COPD: Not in exacerbation: Out patient follow up with pulmonology. We spoke about the importance of tobacco cessation. Prn inhalational bronchodilators  Hypertension: Started Lisinopril  10 mg on 7/18. Close BP Monitoring.  Tobacco abuse: Advised strongly regarding tobacco cessation.  Disposition: Home, IADL.   DVT prophylaxis: enoxaparin  (LOVENOX ) injection 40 mg Start: 01/01/24 2200 SCD's Start: 12/31/23 1518     Code Status: Full Code Family Communication:   Status is: Inpatient Remains inpatient appropriate because: Chest tube in place   Subjective:  Denies chest pain or shortness of breath.He is frustrated about having to stay in the hospital. He told me that he is a very active person at baseline.  Examination:  General exam: Appears calm and comfortable  Respiratory system: Clear to auscultation. Respiratory effort normal. Right sided CT in place Cardiovascular system: S1 & S2 heard, RRR. No JVD,  murmurs, rubs, gallops or clicks. No pedal edema. Gastrointestinal system: Abdomen is nondistended, soft and nontender. No organomegaly or masses felt. Normal bowel sounds heard. Central nervous system: Alert and oriented. No focal neurological deficits. Extremities: Symmetric 5 x 5 power. Skin: No rashes, lesions or ulcers Psychiatry: Judgement and insight appear normal. Mood & affect appropriate.      Diet Orders (From admission, onward)     Start     Ordered   01/02/24 1317  Diet regular Room service appropriate? Yes; Fluid consistency: Thin  Diet effective now       Question Answer Comment  Room service appropriate? Yes   Fluid consistency: Thin      01/02/24 1317            Objective: Vitals:   01/07/24 0500 01/07/24 0717 01/07/24 0829 01/07/24 0907  BP:  (!) 145/94  (!) 147/112  Pulse:  88    Resp:  17 20   Temp:  98.6 F (37 C)    TempSrc:  Oral    SpO2:  (!) 89%    Weight: 75.8 kg     Height:        Intake/Output Summary (Last 24 hours) at 01/07/2024 1000 Last data filed at 01/07/2024 0311 Gross per 24 hour  Intake 360 ml  Output 130 ml  Net 230 ml   Filed Weights   01/05/24 0419 01/06/24 0500 01/07/24 0500  Weight: 77 kg 76.3 kg 75.8 kg    Scheduled Meds:  allopurinol   300 mg Oral Daily   arformoterol   15 mcg Nebulization BID   bisacodyl   10 mg Oral Daily   enoxaparin  (LOVENOX ) injection  40 mg Subcutaneous QHS  feeding supplement  237 mL Oral BID BM   gabapentin   300 mg Oral TID   ketorolac   30 mg Intravenous Q8H   lisinopril   10 mg Oral Daily   nicotine   21 mg Transdermal Daily   pantoprazole   40 mg Oral Daily   polyethylene glycol  17 g Oral Daily   revefenacin   175 mcg Nebulization Daily   senna-docusate  1 tablet Oral BID   Continuous Infusions:    Nutritional status Signs/Symptoms: moderate fat depletion, moderate muscle depletion, severe muscle depletion Interventions: Tube feeding Body mass index is 23.99 kg/m.  Data  Reviewed:   CBC: Recent Labs  Lab 01/02/24 0450 01/02/24 0601 01/04/24 0248 01/05/24 0200 01/06/24 0235 01/07/24 0228  WBC 14.2*  --  9.2 9.4 8.3 6.8  HGB 11.5* 10.9* 10.6* 10.7* 10.2* 10.7*  HCT 33.2* 32.0* 31.7* 31.1* 30.2* 30.3*  MCV 90.5  --  90.3 89.9 91.8 88.9  PLT 310  --  244 251 245 272   Basic Metabolic Panel: Recent Labs  Lab 01/01/24 1427 01/01/24 1905 01/02/24 0450 01/02/24 0601 01/03/24 0355 01/04/24 0248 01/05/24 0200 01/06/24 0235 01/07/24 0228  NA  --    < > 139   < > 134* 136 134* 137 136  K  --    < > 3.8   < > 3.6 4.1 3.8 4.0 3.8  CL  --   --  102  --  102 102 101 102 100  CO2  --   --  24  --  23 26 25 24 27   GLUCOSE  --   --  97  --  135* 104* 149* 106* 108*  BUN  --   --  21*  --  15 18 16 16 15   CREATININE  --   --  0.71  --  0.72 0.81 0.74 0.67 0.62  CALCIUM  --   --  8.4*  --  8.5* 8.2* 8.4* 8.5* 8.7*  MG 2.1  --  2.1  --  1.7 1.8  --   --   --   PHOS 3.2  --  2.9  --  3.6 3.8  --   --   --    < > = values in this interval not displayed.   GFR: Estimated Creatinine Clearance: 115.3 mL/min (by C-G formula based on SCr of 0.62 mg/dL). Liver Function Tests: Recent Labs  Lab 01/02/24 0450  AST 45*  ALT 34  ALKPHOS 78  BILITOT 0.7  PROT 5.4*  ALBUMIN  2.3*   No results for input(s): LIPASE, AMYLASE in the last 168 hours. No results for input(s): AMMONIA in the last 168 hours. Coagulation Profile: No results for input(s): INR, PROTIME in the last 168 hours.  Cardiac Enzymes: No results for input(s): CKTOTAL, CKMB, CKMBINDEX, TROPONINI in the last 168 hours. BNP (last 3 results) No results for input(s): PROBNP in the last 8760 hours. HbA1C: No results for input(s): HGBA1C in the last 72 hours. CBG: Recent Labs  Lab 01/05/24 1619 01/05/24 2027 01/06/24 0020 01/06/24 0448 01/06/24 0744  GLUCAP 97 122* 112* 94 109*   Lipid Profile: No results for input(s): CHOL, HDL, LDLCALC, TRIG, CHOLHDL,  LDLDIRECT in the last 72 hours. Thyroid Function Tests: No results for input(s): TSH, T4TOTAL, FREET4, T3FREE, THYROIDAB in the last 72 hours. Anemia Panel: No results for input(s): VITAMINB12, FOLATE, FERRITIN, TIBC, IRON, RETICCTPCT in the last 72 hours. Sepsis Labs: No results for input(s): PROCALCITON, LATICACIDVEN in the last 168 hours.  Recent Results (from the past 240 hours)  Surgical pcr screen     Status: Abnormal   Collection Time: 12/31/23  9:14 AM   Specimen: Nasal Mucosa; Nasal Swab  Result Value Ref Range Status   MRSA, PCR NEGATIVE NEGATIVE Final   Staphylococcus aureus POSITIVE (A) NEGATIVE Final    Comment: (NOTE) The Xpert SA Assay (FDA approved for NASAL specimens in patients 59 years of age and older), is one component of a comprehensive surveillance program. It is not intended to diagnose infection nor to guide or monitor treatment. Performed at [facility], [address], [place], [place] [address]          Radiology Studies: DG CHEST PORT 1 VIEW Result Date: 01/07/2024 CLINICAL DATA:  Status post lung surgery, right pneumothorax. EXAM: PORTABLE CHEST 1 VIEW COMPARISON:  01/06/2024 and CT chest 12/31/2023. FINDINGS: Trachea is midline. Heart size normal. Right chest tube terminates at the apex of the right hemithorax. Biapical bullous disease. Platelike atelectasis in the base of the right hemithorax. Small right pleural effusion, similar. IMPRESSION: 1. No definite pneumothorax with right chest tube in place. 2. Platelike atelectasis in the right lung base with streaky bibasilar atelectasis. Electronically Signed   By: Newell Eke M.D.   On: 01/07/2024 09:09   DG CHEST PORT 1 VIEW Result Date: 01/06/2024 CLINICAL DATA:  48 year old male with bullous emphysema, right side pneumothorax, chest tube. EXAM: PORTABLE CHEST 1 VIEW COMPARISON:  Portable chest yesterday and earlier. FINDINGS: Portable AP semi upright view at  0609 hours. Stable right chest tube coursing to the apex. No definite pneumothorax visible today. Stable indistinct right lower lung opacity, in part adjacent to the hilum. Stable large lung volumes. Mediastinal contours remain within normal limits. Left lung appears stable and negative. Paucity of bowel gas. No acute osseous abnormality identified. No subcutaneous emphysema. IMPRESSION: 1. Stable right chest tube. No definite pneumothorax today. 2. Stable right lower lung opacity. No new cardiopulmonary abnormality. Electronically Signed   By: VEAR Hurst M.D.   On: 01/06/2024 08:57      LOS: 9 days   Time spent= 39 mins    Deliliah Room, MD [facility]  If 7PM-7AM, please contact night-coverage  01/07/2024, 10:00 AM

## 2024-01-07 NOTE — Plan of Care (Signed)
  Problem: Clinical Measurements: Goal: Cardiovascular complication will be avoided Outcome: Progressing   Problem: Activity: Goal: Risk for activity intolerance will decrease Outcome: Progressing   Problem: Nutrition: Goal: Adequate nutrition will be maintained Outcome: Progressing   Problem: Coping: Goal: Level of anxiety will decrease Outcome: Progressing   Problem: Elimination: Goal: Will not experience complications related to bowel motility Outcome: Progressing Goal: Will not experience complications related to urinary retention Outcome: Progressing   Problem: Pain Managment: Goal: General experience of comfort will improve and/or be controlled Outcome: Progressing

## 2024-01-07 NOTE — Progress Notes (Addendum)
 Nutrition Follow-up  DOCUMENTATION CODES:   Severe malnutrition in context of acute illness/injury -  of note, patient's muscle and fat depletions meet criteria for malnutrition in the context of acute illness, however suspect this to be more chronic in nature that has been exacerbated by acute recurrent pneumothorax  INTERVENTION:  Discontinue Ensure Plus High Protein per patient preference Add double portion proteins to meal trays Add MVI w/ minerals Discussed importance of adequately meeting calorie/protein needs to promote weight gain, preservation/rebuilding of lean body mass, and promote wound healing  NUTRITION DIAGNOSIS:  Severe Malnutrition related to acute illness (recurrent pneumothorax) as evidenced by moderate fat depletion, moderate muscle depletion, severe muscle depletion. - remains applicable  GOAL:  Patient will meet greater than or equal to 90% of their needs - progressing  MONITOR:  Vent status, Labs, Weight trends, TF tolerance  REASON FOR ASSESSMENT:  Ventilator   ASSESSMENT:  Pt admitted with sudden onset of SOB, found to have pneumothorax on admission. PMH significant for pneumothroax 3 weeks ago, HTN and tobacco abuse.  7/14: intubated; s/p VATS, blebectomy  7/15: trickle TF started  7/16: extubated; oral diet initiated 7/17: txr out of ICU 7/20: CXR: no definite pneumothorax seen  Continues with persistent air leak which is precluding his discharge. He understands the reason he is still admitted, but voices frustration as well. States it has been a rough two years starting with a slipped disc requiring multiple back surgeries in 2023-2024, after which he lost his job and was residing in a motel. Subsequently developed and required surgery for internal hernias. Obtained housing and was getting settled as of September 2024 and secured housing, when he started to have his current issues. Works for [employer].   Average Meal Intake 7/17: 75% x1  documented meal 7/19: 50-100% x2 documented meals 7/20: 100% x1 documented meal  States appetite is picking up. Can tell he has lost lean body mass and reports feeling weak, but is ambulating and working on his strength. Reiterated importance of calorie and protein intake to regain lean body mass and prevent further decline. He does not prefer the Ensure supplements. Will d/c and add double portion proteins to augment intake.   24 Hour Recall B: egg sandwich w/ bologna and Coke or water L: sandwich w/ chips and Coke or water D: Protein, starch, veg, w/ Coke or water  Endorses no significant changes to intake prior to admission, but difficulty breathing. Did not refeed s/p initiation of tube feed. No difficulty chewing or swallowing with adequate dentition observed.  Admit Weight: 69.9 kg Current Weight: 75.8 kg  Weight with good trend up this admission. More in line w/ usual body weight, per chart review. Edema not significant on exam. He reports a UBW around 165-170lbs. Per chart review, no weight history since 2023 and therefore cannot be used to assess recent body weight trends.   Drains/Lines: R Pleural: Chest tube (28Fr): x24 hours  Meds: bisacodyl , gabapentin , pantoprazole , Miralax , senna-docusate  Labs: Na+ 134>137>136 (wdl) WBC 17.5>14.2--->6.8 (wdl) CBGs 106-108 x24 hours   NUTRITION - FOCUSED PHYSICAL EXAM:  Flowsheet Row Most Recent Value  Orbital Region Severe depletion  Upper Arm Region Moderate depletion  Thoracic and Lumbar Region Moderate depletion  Buccal Region Moderate depletion  Temple Region Severe depletion  Clavicle Bone Region Severe depletion  Clavicle and Acromion Bone Region Moderate depletion  Scapular Bone Region Severe depletion  Dorsal Hand Mild depletion  Patellar Region Moderate depletion  Anterior Thigh Region Moderate depletion  Posterior Calf Region Moderate depletion  Edema (RD Assessment) Mild  Hair Reviewed  Eyes Reviewed   Mouth Reviewed  Skin Reviewed  Nails Reviewed     Diet Order:   Diet Order             Diet regular Room service appropriate? Yes; Fluid consistency: Thin  Diet effective now            EDUCATION NEEDS:   No education needs have been identified at this time  Skin:  Skin Assessment: Reviewed RN Assessment  Last BM:  unknown/PTA; abdomen soft  Height:  Ht Readings from Last 1 Encounters:  01/02/24 5' 10 (1.778 m)   Weight:  Wt Readings from Last 1 Encounters:  01/07/24 75.8 kg   Ideal Body Weight:  75.5 kg  BMI:  Body mass index is 23.99 kg/m.  Estimated Nutritional Needs:   Kcal:  2000-2200  Protein:  95-115g  Fluid:  >/=2L  Blair Deaner MS, RD, LDN Registered Dietitian Clinical Nutrition RD Inpatient Contact Info in Amion

## 2024-01-08 ENCOUNTER — Inpatient Hospital Stay (HOSPITAL_COMMUNITY)

## 2024-01-08 DIAGNOSIS — Z4682 Encounter for fitting and adjustment of non-vascular catheter: Secondary | ICD-10-CM | POA: Diagnosis not present

## 2024-01-08 DIAGNOSIS — J939 Pneumothorax, unspecified: Secondary | ICD-10-CM | POA: Diagnosis not present

## 2024-01-08 DIAGNOSIS — J9383 Other pneumothorax: Secondary | ICD-10-CM | POA: Diagnosis not present

## 2024-01-08 LAB — BASIC METABOLIC PANEL WITH GFR
Anion gap: 8 (ref 5–15)
BUN: 13 mg/dL (ref 6–20)
CO2: 26 mmol/L (ref 22–32)
Calcium: 8.8 mg/dL — ABNORMAL LOW (ref 8.9–10.3)
Chloride: 102 mmol/L (ref 98–111)
Creatinine, Ser: 0.68 mg/dL (ref 0.61–1.24)
GFR, Estimated: >60 mL/min (ref >60)
Glucose, Bld: 117 mg/dL — ABNORMAL HIGH (ref 70–99)
Potassium: 3.8 mmol/L (ref 3.5–5.1)
Sodium: 136 mmol/L (ref 135–145)

## 2024-01-08 LAB — CBC
HCT: 31.4 % — ABNORMAL LOW (ref 39.0–52.0)
Hemoglobin: 10.9 g/dL — ABNORMAL LOW (ref 13.0–17.0)
MCH: 30.8 pg (ref 26.0–34.0)
MCHC: 34.7 g/dL (ref 30.0–36.0)
MCV: 88.7 fL (ref 80.0–100.0)
Platelets: 327 K/uL (ref 150–400)
RBC: 3.54 MIL/uL — ABNORMAL LOW (ref 4.22–5.81)
RDW: 12.5 % (ref 11.5–15.5)
WBC: 7 K/uL (ref 4.0–10.5)
nRBC: 0 % (ref 0.0–0.2)

## 2024-01-08 MED ORDER — OXYCODONE HCL 5 MG PO TABS
10.0000 mg | ORAL_TABLET | ORAL | Status: DC | PRN
  Administered 2024-01-08 – 2024-01-15 (×33): 10 mg via ORAL
  Filled 2024-01-08 (×33): qty 2

## 2024-01-08 MED ORDER — OXYCODONE HCL 5 MG PO TABS
5.0000 mg | ORAL_TABLET | ORAL | Status: DC | PRN
  Administered 2024-01-08 (×2): 5 mg via ORAL
  Filled 2024-01-08 (×2): qty 1

## 2024-01-08 MED ORDER — SODIUM CHLORIDE (PF) 0.9 % IJ SOLN
INTRAMUSCULAR | Status: AC
  Administered 2024-01-08: 10 mL
  Filled 2024-01-08: qty 10

## 2024-01-08 MED ORDER — KETOROLAC TROMETHAMINE 15 MG/ML IJ SOLN
15.0000 mg | Freq: Four times a day (QID) | INTRAMUSCULAR | Status: DC | PRN
  Administered 2024-01-08 – 2024-01-12 (×7): 15 mg via INTRAVENOUS
  Filled 2024-01-08 (×7): qty 1

## 2024-01-08 MED ORDER — GUAIFENESIN ER 600 MG PO TB12
600.0000 mg | ORAL_TABLET | Freq: Two times a day (BID) | ORAL | Status: DC
  Administered 2024-01-08 – 2024-01-15 (×15): 600 mg via ORAL
  Filled 2024-01-08 (×15): qty 1

## 2024-01-08 NOTE — Progress Notes (Signed)
 PROGRESS NOTE    Albert Hill  FMW:[id number] DOB: 11-05-1975 DOA: 12/29/2023 PCP: Lorren Greig PARAS, NP   Brief Narrative:  48 year old man w/ hx of COPD, OSA, HTN who presented with recurrent right pneumothorax. After discussion, underwent VATS w R apical bleb resection, pleural abrasion. Pre-op noted to have significant hypoxemia and WOB so left intubated. Extubated on 7/16, transferred care to [facility] on 7/18. Right sided chest tube in place,connected to water seal. CTS on board. He has persistent air leak.   Assessment & Plan:  Principal Problem:   Recurrent spontaneous pneumothorax Active Problems:   Protein-calorie malnutrition, severe   Recurrent Pneumothorax,POA:  In the setting of bullous emphysema, continued tobacco abuse S/p Right sided VATS Extubated on 7/16. Right sided chest tube in place,connected to water seal. Cardiothoracic surgery on board and managing chest tube. He has Persistent air leak F/u repeat CXR 7/22: Slight apical pneumothorax seen.  COPD: Not in exacerbation: Out patient follow up with pulmonology. We spoke about the importance of tobacco cessation. Prn inhalational bronchodilators  Hypertension: Continue Lisinopril  20 mg. Close BP Monitoring.  Tobacco abuse: Advised strongly regarding tobacco cessation.  Disposition: Home, IADL.   DVT prophylaxis: enoxaparin  (LOVENOX ) injection 40 mg Start: 01/01/24 2200 SCD's Start: 12/31/23 1518     Code Status: Full Code Family Communication:   Status is: Inpatient Remains inpatient appropriate because: Chest tube in place   Subjective:  Overnight,Chest tube tubing to [place] broke and was detached around 4AM chest tube was clamped while they waited for a new [place]. New [place] was placed. Patient did have some right sided pleuritic chest pain. He appears frustrated because of his prolonged length of stay. He is also complaining of chest congestion and productive cough.  Examination:  General  exam: NAD, AAO x 3 Respiratory system: Clear to auscultation. Respiratory effort normal. Right sided CT in place connected to water seal Cardiovascular system: S1 & S2 heard, RRR. No JVD, murmurs, rubs, gallops or clicks. No pedal edema. Gastrointestinal system: Abdomen is nondistended, soft and nontender. No organomegaly or masses felt. Normal bowel sounds heard. Central nervous system: Alert and oriented. No focal neurological deficits. Extremities: Symmetric 5 x 5 power. Skin: No rashes, lesions or ulcers Psychiatry: Judgement and insight appear normal. Mood & affect appropriate.      Diet Orders (From admission, onward)     Start     Ordered   01/02/24 1317  Diet regular Room service appropriate? Yes; Fluid consistency: Thin  Diet effective now       Question Answer Comment  Room service appropriate? Yes   Fluid consistency: Thin      01/02/24 1317            Objective: Vitals:   01/08/24 0325 01/08/24 0713 01/08/24 0747 01/08/24 0849  BP: (!) 154/106 (!) 169/108  (!) 155/101  Pulse: 100 98 (!) 110   Resp: 19 19 19    Temp: 98.2 F (36.8 C) 98.4 F (36.9 C)    TempSrc: Oral Oral    SpO2: 98% 93% 98%   Weight: 75.8 kg     Height:        Intake/Output Summary (Last 24 hours) at 01/08/2024 0914 Last data filed at 01/08/2024 9362 Gross per 24 hour  Intake 360 ml  Output 226 ml  Net 134 ml   Filed Weights   01/06/24 0500 01/07/24 0500 01/08/24 0325  Weight: 76.3 kg 75.8 kg 75.8 kg    Scheduled Meds:  allopurinol   300 mg Oral Daily   arformoterol   15 mcg Nebulization BID   bisacodyl   10 mg Oral Daily   enoxaparin  (LOVENOX ) injection  40 mg Subcutaneous QHS   gabapentin   300 mg Oral TID   guaiFENesin   600 mg Oral BID   lisinopril   20 mg Oral Daily   multivitamin with minerals  1 tablet Oral Daily   nicotine   21 mg Transdermal Daily   pantoprazole   40 mg Oral Daily   polyethylene glycol  17 g Oral Daily   revefenacin   175 mcg Nebulization Daily    senna-docusate  1 tablet Oral BID   Continuous Infusions:    Nutritional status Signs/Symptoms: moderate fat depletion, moderate muscle depletion, severe muscle depletion Interventions: Tube feeding Body mass index is 23.98 kg/m.  Data Reviewed:   CBC: Recent Labs  Lab 01/04/24 0248 01/05/24 0200 01/06/24 0235 01/07/24 0228 01/08/24 0159  WBC 9.2 9.4 8.3 6.8 7.0  HGB 10.6* 10.7* 10.2* 10.7* 10.9*  HCT 31.7* 31.1* 30.2* 30.3* 31.4*  MCV 90.3 89.9 91.8 88.9 88.7  PLT 244 251 245 272 327   Basic Metabolic Panel: Recent Labs  Lab 01/01/24 1427 01/01/24 1905 01/02/24 0450 01/02/24 0601 01/03/24 0355 01/04/24 0248 01/05/24 0200 01/06/24 0235 01/07/24 0228 01/08/24 0159  NA  --    < > 139   < > 134* 136 134* 137 136 136  K  --    < > 3.8   < > 3.6 4.1 3.8 4.0 3.8 3.8  CL  --    < > 102  --  102 102 101 102 100 102  CO2  --    < > 24  --  23 26 25 24 27 26   GLUCOSE  --    < > 97  --  135* 104* 149* 106* 108* 117*  BUN  --    < > 21*  --  15 18 16 16 15 13   CREATININE  --    < > 0.71  --  0.72 0.81 0.74 0.67 0.62 0.68  CALCIUM  --    < > 8.4*  --  8.5* 8.2* 8.4* 8.5* 8.7* 8.8*  MG 2.1  --  2.1  --  1.7 1.8  --   --   --   --   PHOS 3.2  --  2.9  --  3.6 3.8  --   --   --   --    < > = values in this interval not displayed.   GFR: Estimated Creatinine Clearance: 115.3 mL/min (by C-G formula based on SCr of 0.68 mg/dL). Liver Function Tests: Recent Labs  Lab 01/02/24 0450  AST 45*  ALT 34  ALKPHOS 78  BILITOT 0.7  PROT 5.4*  ALBUMIN  2.3*   No results for input(s): LIPASE, AMYLASE in the last 168 hours. No results for input(s): AMMONIA in the last 168 hours. Coagulation Profile: No results for input(s): INR, PROTIME in the last 168 hours.  Cardiac Enzymes: No results for input(s): CKTOTAL, CKMB, CKMBINDEX, TROPONINI in the last 168 hours. BNP (last 3 results) No results for input(s): PROBNP in the last 8760 hours. HbA1C: No results  for input(s): HGBA1C in the last 72 hours. CBG: Recent Labs  Lab 01/05/24 1619 01/05/24 2027 01/06/24 0020 01/06/24 0448 01/06/24 0744  GLUCAP 97 122* 112* 94 109*   Lipid Profile: No results for input(s): CHOL, HDL, LDLCALC, TRIG, CHOLHDL, LDLDIRECT in the last 72 hours. Thyroid Function Tests: No results for  input(s): TSH, T4TOTAL, FREET4, T3FREE, THYROIDAB in the last 72 hours. Anemia Panel: No results for input(s): VITAMINB12, FOLATE, FERRITIN, TIBC, IRON, RETICCTPCT in the last 72 hours. Sepsis Labs: No results for input(s): PROCALCITON, LATICACIDVEN in the last 168 hours.  Recent Results (from the past 240 hours)  Surgical pcr screen     Status: Abnormal   Collection Time: 12/31/23  9:14 AM   Specimen: Nasal Mucosa; Nasal Swab  Result Value Ref Range Status   MRSA, PCR NEGATIVE NEGATIVE Final   Staphylococcus aureus POSITIVE (A) NEGATIVE Final    Comment: (NOTE) The Xpert SA Assay (FDA approved for NASAL specimens in patients 5 years of age and older), is one component of a comprehensive surveillance program. It is not intended to diagnose infection nor to guide or monitor treatment. Performed at [facility], [address], [place], [place] [address]          Radiology Studies: DG CHEST PORT 1 VIEW Result Date: 01/07/2024 CLINICAL DATA:  Status post lung surgery, right pneumothorax. EXAM: PORTABLE CHEST 1 VIEW COMPARISON:  01/06/2024 and CT chest 12/31/2023. FINDINGS: Trachea is midline. Heart size normal. Right chest tube terminates at the apex of the right hemithorax. Biapical bullous disease. Platelike atelectasis in the base of the right hemithorax. Small right pleural effusion, similar. IMPRESSION: 1. No definite pneumothorax with right chest tube in place. 2. Platelike atelectasis in the right lung base with streaky bibasilar atelectasis. Electronically Signed   By: Newell Eke M.D.   On: 01/07/2024 09:09       LOS: 10 days   Time spent= 41 mins    Deliliah Room, MD [facility]  If 7PM-7AM, please contact night-coverage  01/08/2024, 9:14 AM

## 2024-01-08 NOTE — Progress Notes (Signed)
 Physical Therapy Discharge Patient Details Name: Albert Hill MRN: [id number] DOB: 12-May-1976 Today's Date: 01/08/2024 Time:  -     Patient discharged from PT services secondary to goals met and no further PT needs identified.  Please see latest therapy progress note for current level of functioning and progress toward goals.    Progress and discharge plan discussed with patient and/or caregiver: Patient/Caregiver agrees with plan  GP     Stephane JULIANNA Bevel 01/08/2024, 8:33 AM  Auryn Paige M,PT Acute Rehab Services [phone]

## 2024-01-08 NOTE — Progress Notes (Signed)
 Pt's [place] system was previously changed on 7/21 @ 2200. Nurse Tech attempted to get a standing weight on pt @ 0400 on 7/22. Pt had set the [place] system on the counter, pt stated that he had forgotten that he had a chest tube & the [place] fell to the ground. The contents of the [place] spilled onto the floor & primary RN was notified. The [place] system was assessed & the tubing directly attached to the [place] was broken, attempted to reinforce with tape until a new system could be ordered. Tubing completely detatched, chest tube clamped, [place] obtained from another unit. Pt instructed to not tamper with [place]. When RN returned to the room pt had his set of car keys & was digging into the chest tube tubing that was detached. New [place] system placed & pt educated.  Lonell LITTIE Lyme, RN

## 2024-01-08 NOTE — Progress Notes (Signed)
 Mobility Specialist Progress Note:    01/08/24 1145  Mobility  Activity Ambulated independently in hallway  Level of Assistance Independent  Assistive Device None  Distance Ambulated (ft) 500 ft  Activity Response Tolerated well  Mobility Referral Yes  Mobility visit 1 Mobility  Mobility Specialist Start Time (ACUTE ONLY) 1145  Mobility Specialist Stop Time (ACUTE ONLY) 1156  Mobility Specialist Time Calculation (min) (ACUTE ONLY) 11 min   Received pt in recliner. Agreeable to session. Pt feeling well, ambulating well, no c/o any symptoms. Returned to [employer] w/ all needs met.   Venetia Keel Mobility Specialist Please Neurosurgeon or Rehab Office at [phone]

## 2024-01-08 NOTE — Progress Notes (Addendum)
      [address] [place] [employer] [address]             [phone]      8 Days Post-Op Procedure(s) (LRB): ROBOTIC ASSISTED RIGHT VIDEO ASSISTED THORACOSCOPIC SURGERY, BLEB RESECTION, PLEURAL ABRASION (Right) BLOCK, NERVE, INTERCOSTAL (Right) Subjective: Patient reports he feels a little more congested this AM.   Objective: Vital signs in last 24 hours: Temp:  [97.9 F (36.6 C)-98.5 F (36.9 C)] 98.4 F (36.9 C) (07/22 0713) Pulse Rate:  [87-100] 98 (07/22 0713) Cardiac Rhythm: Normal sinus rhythm (07/22 0700) Resp:  [18-20] 19 (07/22 0713) BP: (147-171)/(102-115) 169/108 (07/22 0713) SpO2:  [93 %-100 %] 93 % (07/22 0713) Weight:  [75.8 kg] 75.8 kg (07/22 0325)  Hemodynamic parameters for last 24 hours:    Intake/Output from previous day: 07/21 0701 - 07/22 0700 In: 360 [P.O.:360] Out: 226 [Chest Tube:226] Intake/Output this shift: No intake/output data recorded.  General appearance: alert, cooperative, and no distress Heart: NSR-sinus tachycardia, no murmur Lungs: diminished right apical breath sounds, chest tube with + air leak Wound: Clean and dry dressing in place  Lab Results: Recent Labs    01/07/24 0228 01/08/24 0159  WBC 6.8 7.0  HGB 10.7* 10.9*  HCT 30.3* 31.4*  PLT 272 327   BMET:  Recent Labs    01/07/24 0228 01/08/24 0159  NA 136 136  K 3.8 3.8  CL 100 102  CO2 27 26  GLUCOSE 108* 117*  BUN 15 13  CREATININE 0.62 0.68  CALCIUM 8.7* 8.8*    PT/INR: No results for input(s): LABPROT, INR in the last 72 hours. ABG    Component Value Date/Time   PHART 7.447 01/02/2024 0601   HCO3 24.9 01/02/2024 0601   TCO2 26 01/02/2024 0601   ACIDBASEDEF 1.0 12/31/2023 1540   O2SAT 97 01/02/2024 0601   CBG (last 3)  Recent Labs    01/06/24 0020 01/06/24 0448 01/06/24 0744  GLUCAP 112* 94 109*    Assessment/Plan: S/P Procedure(s) (LRB): ROBOTIC ASSISTED RIGHT VIDEO ASSISTED THORACOSCOPIC SURGERY, BLEB RESECTION,  PLEURAL ABRASION (Right) BLOCK, NERVE, INTERCOSTAL (Right)  Pulm: Chest tube tubing to [place] broke and was detached around 4AM chest tube was clamped while they waited for a new [place]. New [place] was placed. CXR with new right apical pneumothorax. CT with 2+ air leak with cough. Leave CT in place, no increased SOB or distress so will leave to waterseal for now. Will add mucinex  for congestion. Saturating well on RA this AM. Encourage IS and ambulation.   Dispo: Continue chest tube, primary team managing patient    LOS: 10 days    Con GORMAN Bend, pA-C 01/08/2024  Patient seen and examined, agree with above Small apical space, likely related to tube clamping/ Pleuravac change Still has an air leak- keep on water seal  Elspeth C. Kerrin, MD [facility] [phone]

## 2024-01-08 NOTE — Plan of Care (Signed)

## 2024-01-09 ENCOUNTER — Encounter: Payer: Self-pay | Admitting: Family

## 2024-01-09 ENCOUNTER — Ambulatory Visit

## 2024-01-09 ENCOUNTER — Inpatient Hospital Stay (HOSPITAL_COMMUNITY)

## 2024-01-09 DIAGNOSIS — E43 Unspecified severe protein-calorie malnutrition: Secondary | ICD-10-CM

## 2024-01-09 DIAGNOSIS — J9383 Other pneumothorax: Secondary | ICD-10-CM | POA: Diagnosis not present

## 2024-01-09 MED ORDER — HYDRALAZINE HCL 25 MG PO TABS
25.0000 mg | ORAL_TABLET | Freq: Four times a day (QID) | ORAL | Status: DC | PRN
  Administered 2024-01-09: 25 mg via ORAL
  Filled 2024-01-09: qty 1

## 2024-01-09 NOTE — Progress Notes (Signed)
 PROGRESS NOTE  Albert Hill FMW:[id number] DOB: [birth year]   PCP: Lorren Greig PARAS, NP  Patient is from: Home.  Independently ambulates at baseline  DOA: 12/29/2023 LOS: 11  Chief complaints Chief Complaint  Patient presents with   Shortness of Breath     Brief Narrative / Interim history: 48 year old man w/ hx of COPD, OSA, HTN who presented with recurrent right pneumothorax. After discussion, underwent VATS w R apical bleb resection, pleural abrasion. Pre-op noted to have significant hypoxemia and WOB so left intubated. Extubated on 7/16, transferred care to [facility] on 7/18. Right sided chest tube in place,connected to water seal. CTS on board. He has persistent air leak.   Subjective: Seen and examined earlier this morning.  No major events overnight or this morning.  No complaints.  Objective: Vitals:   01/09/24 0500 01/09/24 0714 01/09/24 0841 01/09/24 1144  BP:  (!) 130/98  (!) 139/105  Pulse:  97 96 87  Resp:  13 16 15   Temp:  99.3 F (37.4 C)  98.6 F (37 C)  TempSrc:  Oral  Oral  SpO2:  92% 95% 96%  Weight: 72.8 kg     Height:        Examination:  GENERAL: No apparent distress.  Nontoxic. HEENT: MMM.  Vision and hearing grossly intact.  NECK: Supple.  No apparent JVD.  RESP:  No IWOB.  Fair aeration bilaterally.  Chest tube to right chest. CVS:  RRR. Heart sounds normal.  ABD/GI/GU: BS+. Abd soft, NTND.  MSK/EXT:  Moves extremities. No apparent deformity. No edema.  SKIN: no apparent skin lesion or wound NEURO: AA.  Oriented appropriately.  No apparent focal neuro deficit. PSYCH: Calm. Normal affect.   Consultants:  Cardiothoracic surgery   Microbiology summarized: None  Assessment and plan: Recurrent Pneumothorax,POA:  In the setting of bullous emphysema, continued tobacco abuse -S/p Right sided VATS -Extubated on 7/16. -CXR with slight apical PTX.   -Chest tube to waterseal per CTS.   - Daily CXR   COPD: Not in  exacerbation: -Bronchodilators. -Encouraged smoking cessation   Hypertension: BP slightly elevated.   -Continue Lisinopril  20 mg. - P.o. hydralazine  as needed   Tobacco abuse: Advised strongly regarding tobacco cessation.   Disposition: Home, IADL  Body mass index is 23.04 kg/m.  Severe malnutrition Nutrition Problem: Severe Malnutrition Etiology: acute illness (recurrent pneumothorax) Signs/Symptoms: moderate fat depletion, moderate muscle depletion, severe muscle depletion Interventions: Tube feeding   DVT prophylaxis:  enoxaparin  (LOVENOX ) injection 40 mg Start: 01/01/24 2200 SCD's Start: 12/31/23 1518  Code Status: Full code Family Communication: None at bedside Level of care: Progressive Status is: Inpatient Remains inpatient appropriate because: Persistent pneumothorax   Final disposition: Home once medically cleared   35 minutes with more than 50% spent in reviewing records, counseling patient/family and coordinating care.   Sch Meds:  Scheduled Meds:  allopurinol   300 mg Oral Daily   arformoterol   15 mcg Nebulization BID   bisacodyl   10 mg Oral Daily   enoxaparin  (LOVENOX ) injection  40 mg Subcutaneous QHS   gabapentin   300 mg Oral TID   guaiFENesin   600 mg Oral BID   lisinopril   20 mg Oral Daily   multivitamin with minerals  1 tablet Oral Daily   nicotine   21 mg Transdermal Daily   pantoprazole   40 mg Oral Daily   polyethylene glycol  17 g Oral Daily   revefenacin   175 mcg Nebulization Daily   senna-docusate  1 tablet Oral BID  Continuous Infusions: PRN Meds:.hydrALAZINE , ipratropium-albuterol , ketorolac , LORazepam , naphazoline-glycerin , ondansetron  **OR** ondansetron  (ZOFRAN ) IV, mouth rinse, oxyCODONE , phenol, temazepam , traMADol   Antimicrobials: Anti-infectives (From admission, onward)    Start     Dose/Rate Route Frequency Ordered Stop   01/01/24 1500  piperacillin -tazobactam (ZOSYN ) IVPB 3.375 g        3.375 g 12.5 mL/hr over 240 Minutes  Intravenous Every 8 hours 01/01/24 0736 01/06/24 1032   01/01/24 1000  vancomycin  (VANCOREADY) IVPB 1500 mg/300 mL        1,500 mg 150 mL/hr over 120 Minutes Intravenous Every 12 hours 01/01/24 0744 01/05/24 2355   01/01/24 0830  piperacillin -tazobactam (ZOSYN ) IVPB 3.375 g        3.375 g 100 mL/hr over 30 Minutes Intravenous  Once 01/01/24 0736 01/01/24 0823   01/01/24 0830  vancomycin  (VANCOREADY) IVPB 1500 mg/300 mL  Status:  Discontinued        1,500 mg 150 mL/hr over 120 Minutes Intravenous Every 12 hours 01/01/24 0736 01/01/24 0744   12/31/23 1615  ceFAZolin  (ANCEF ) IVPB 2g/100 mL premix        2 g 200 mL/hr over 30 Minutes Intravenous Every 8 hours 12/31/23 1517 12/31/23 2350   12/30/23 1858  ceFAZolin  (ANCEF ) IVPB 2g/100 mL premix        2 g 200 mL/hr over 30 Minutes Intravenous 30 min pre-op 12/30/23 1858 12/31/23 1228        I have personally reviewed the following labs and images: CBC: Recent Labs  Lab 01/04/24 0248 01/05/24 0200 01/06/24 0235 01/07/24 0228 01/08/24 0159  WBC 9.2 9.4 8.3 6.8 7.0  HGB 10.6* 10.7* 10.2* 10.7* 10.9*  HCT 31.7* 31.1* 30.2* 30.3* 31.4*  MCV 90.3 89.9 91.8 88.9 88.7  PLT 244 251 245 272 327   BMP &GFR Recent Labs  Lab 01/03/24 0355 01/04/24 0248 01/05/24 0200 01/06/24 0235 01/07/24 0228 01/08/24 0159  NA 134* 136 134* 137 136 136  K 3.6 4.1 3.8 4.0 3.8 3.8  CL 102 102 101 102 100 102  CO2 23 26 25 24 27 26   GLUCOSE 135* 104* 149* 106* 108* 117*  BUN 15 18 16 16 15 13   CREATININE 0.72 0.81 0.74 0.67 0.62 0.68  CALCIUM 8.5* 8.2* 8.4* 8.5* 8.7* 8.8*  MG 1.7 1.8  --   --   --   --   PHOS 3.6 3.8  --   --   --   --    Estimated Creatinine Clearance: 115 mL/min (by C-G formula based on SCr of 0.68 mg/dL). Liver & Pancreas: No results for input(s): AST, ALT, ALKPHOS, BILITOT, PROT, ALBUMIN  in the last 168 hours. No results for input(s): LIPASE, AMYLASE in the last 168 hours. No results for input(s): AMMONIA  in the last 168 hours. Diabetic: No results for input(s): HGBA1C in the last 72 hours. Recent Labs  Lab 01/05/24 1619 01/05/24 2027 01/06/24 0020 01/06/24 0448 01/06/24 0744  GLUCAP 97 122* 112* 94 109*   Cardiac Enzymes: No results for input(s): CKTOTAL, CKMB, CKMBINDEX, TROPONINI in the last 168 hours. No results for input(s): PROBNP in the last 8760 hours. Coagulation Profile: No results for input(s): INR, PROTIME in the last 168 hours. Thyroid Function Tests: No results for input(s): TSH, T4TOTAL, FREET4, T3FREE, THYROIDAB in the last 72 hours. Lipid Profile: No results for input(s): CHOL, HDL, LDLCALC, TRIG, CHOLHDL, LDLDIRECT in the last 72 hours. Anemia Panel: No results for input(s): VITAMINB12, FOLATE, FERRITIN, TIBC, IRON, RETICCTPCT in the last 72 hours. Urine  analysis:    Component Value Date/Time   COLORURINE YELLOW (A) 01/04/2022 1207   APPEARANCEUR HAZY (A) 01/04/2022 1207   APPEARANCEUR Clear 01/22/2019 1023   LABSPEC 1.016 01/04/2022 1207   PHURINE 5.0 01/04/2022 1207   GLUCOSEU NEGATIVE 01/04/2022 1207   HGBUR NEGATIVE 01/04/2022 1207   BILIRUBINUR NEGATIVE 01/04/2022 1207   BILIRUBINUR Negative 01/22/2019 1023   KETONESUR NEGATIVE 01/04/2022 1207   PROTEINUR NEGATIVE 01/04/2022 1207   NITRITE NEGATIVE 01/04/2022 1207   LEUKOCYTESUR NEGATIVE 01/04/2022 1207   Sepsis Labs: Invalid input(s): PROCALCITONIN, LACTICIDVEN  Microbiology: Recent Results (from the past 240 hours)  Surgical pcr screen     Status: Abnormal   Collection Time: 12/31/23  9:14 AM   Specimen: Nasal Mucosa; Nasal Swab  Result Value Ref Range Status   MRSA, PCR NEGATIVE NEGATIVE Final   Staphylococcus aureus POSITIVE (A) NEGATIVE Final    Comment: (NOTE) The Xpert SA Assay (FDA approved for NASAL specimens in patients 30 years of age and older), is one component of a comprehensive surveillance program. It is not intended to  diagnose infection nor to guide or monitor treatment. Performed at [facility], [address], [place], [place] [address]     Radiology Studies: DG CHEST PORT 1 VIEW Result Date: 01/09/2024 EXAM: 1 VIEW XRAY OF THE CHEST 01/09/2024 06:12:00 AM COMPARISON: 01/08/2024 CLINICAL HISTORY: Pneumothorax FINDINGS: LUNGS AND PLEURA: Increase in small-to-moderate right pneumothorax with the visceral pleural line 2.5 cm from the chest wall today versus 2.2 cm on the prior. Increased inferolateral component as well. Right-sided chest tube is unchanged in position. Interstitial thickening and coarsening without lobar consolidation. Mild atelectasis at the left lung base. HEART AND MEDIASTINUM: No acute abnormality of the cardiac and mediastinal silhouettes. BONES AND SOFT TISSUES: No acute osseous abnormality. IMPRESSION: 1. Increase in small-to-moderate right pneumothorax. 2. Right-sided chest tube is unchanged in position. Electronically signed by: Rockey Kilts MD 01/09/2024 10:02 AM EDT RP Workstation: [id number]      Tamaiya Bump T. Leeland Lovelady [facility]  If 7PM-7AM, please contact night-coverage [email or web address] 01/09/2024, 12:59 PM

## 2024-01-09 NOTE — Progress Notes (Signed)
 Mobility Specialist Progress Note;   01/09/24 1103  Mobility  Activity Ambulated independently in hallway  Level of Assistance Standby assist, set-up cues, supervision of patient - no hands on  Assistive Device None  Distance Ambulated (ft) 800 ft  Activity Response Tolerated well  Mobility Referral Yes  Mobility visit 1 Mobility  Mobility Specialist Start Time (ACUTE ONLY) 1103  Mobility Specialist Stop Time (ACUTE ONLY) 1119  Mobility Specialist Time Calculation (min) (ACUTE ONLY) 16 min   Pt eager for mobility. Required no physical assistance during ambulation, SV. VSS on RA. No c/o this session. Pt returned back to room and left with all needs met.  Lauraine Erm Mobility Specialist Please contact via SecureChat or [employer] [phone]

## 2024-01-09 NOTE — Progress Notes (Addendum)
      [address] [employer] [address]             [phone]      9 Days Post-Op Procedure(s) (LRB): ROBOTIC ASSISTED RIGHT VIDEO ASSISTED THORACOSCOPIC SURGERY, BLEB RESECTION, PLEURAL ABRASION (Right) BLOCK, NERVE, INTERCOSTAL (Right) Subjective: Patient reports he is doing much better today than he was yesterday, reports his family was irritating him so his blood pressure was very high.  Objective: Vital signs in last 24 hours: Temp:  [98 F (36.7 C)-99.3 F (37.4 C)] 99.3 F (37.4 C) (07/23 0714) Pulse Rate:  [89-110] 97 (07/23 0714) Cardiac Rhythm: Sinus tachycardia (07/23 0700) Resp:  [12-19] 13 (07/23 0714) BP: (122-171)/(76-110) 130/98 (07/23 0714) SpO2:  [90 %-98 %] 92 % (07/23 0714) Weight:  [72.8 kg] 72.8 kg (07/23 0500)  Hemodynamic parameters for last 24 hours:    Intake/Output from previous day: 07/22 0701 - 07/23 0700 In: 360 [P.O.:360] Out: -  Intake/Output this shift: No intake/output data recorded.  General appearance: alert, cooperative, and no distress Neurologic: intact Heart: sinus tachycardia, no murmur Lungs: diminished right apical breath sounds, chest tube with air leak, chest tube kinked on exam Wound: Clean and dry dressing in place  Lab Results: Recent Labs    01/07/24 0228 01/08/24 0159  WBC 6.8 7.0  HGB 10.7* 10.9*  HCT 30.3* 31.4*  PLT 272 327   BMET:  Recent Labs    01/07/24 0228 01/08/24 0159  NA 136 136  K 3.8 3.8  CL 100 102  CO2 27 26  GLUCOSE 108* 117*  BUN 15 13  CREATININE 0.62 0.68  CALCIUM 8.7* 8.8*    PT/INR: No results for input(s): LABPROT, INR in the last 72 hours. ABG    Component Value Date/Time   PHART 7.447 01/02/2024 0601   HCO3 24.9 01/02/2024 0601   TCO2 26 01/02/2024 0601   ACIDBASEDEF 1.0 12/31/2023 1540   O2SAT 97 01/02/2024 0601   CBG (last 3)  Recent Labs    01/06/24 0744  GLUCAP 109*    Assessment/Plan: S/P Procedure(s) (LRB): ROBOTIC ASSISTED  RIGHT VIDEO ASSISTED THORACOSCOPIC SURGERY, BLEB RESECTION, PLEURAL ABRASION (Right) BLOCK, NERVE, INTERCOSTAL (Right)  Pulm: CXR with slightly increased small right apical pneumothorax this AM. CT with 2+ air leak with cough. CT kinked this AM, removed kink and taped it into place. Leave CT in place, will discuss with surgeon if we should leave to waterseal or transition to suction. Saturating well on RA for the most part but reports he placed oxygen  on this AM. No pulse ox in place. Encourage IS and ambulation.    Dispo: Continue chest tube, primary team managing patient    LOS: 11 days    Con GORMAN Bend, PA-C 01/09/2024 Patient seen and examined, agree with above Still has a leak, small apical pneumo Keep on water seal today  Elspeth C. Kerrin, MD [facility] [phone]

## 2024-01-10 ENCOUNTER — Inpatient Hospital Stay (HOSPITAL_COMMUNITY)

## 2024-01-10 DIAGNOSIS — E43 Unspecified severe protein-calorie malnutrition: Secondary | ICD-10-CM | POA: Diagnosis not present

## 2024-01-10 DIAGNOSIS — J9383 Other pneumothorax: Secondary | ICD-10-CM | POA: Diagnosis not present

## 2024-01-10 NOTE — Progress Notes (Addendum)
      [address] [employer] [address]             [phone]      10 Days Post-Op Procedure(s) (LRB): ROBOTIC ASSISTED RIGHT VIDEO ASSISTED THORACOSCOPIC SURGERY, BLEB RESECTION, PLEURAL ABRASION (Right) BLOCK, NERVE, INTERCOSTAL (Right) Subjective: Patient without new complaints this AM, frustrated that he is still in the hospital.   Objective: Vital signs in last 24 hours: Temp:  [97.8 F (36.6 C)-98.7 F (37.1 C)] 97.8 F (36.6 C) (07/23 2301) Pulse Rate:  [86-97] 86 (07/23 2301) Cardiac Rhythm: Normal sinus rhythm (07/24 0700) Resp:  [15-18] 17 (07/23 2301) BP: (117-143)/(83-105) 117/84 (07/23 2301) SpO2:  [89 %-96 %] 93 % (07/23 2301) Weight:  [73.7 kg] 73.7 kg (07/24 0500)  Hemodynamic parameters for last 24 hours:    Intake/Output from previous day: 07/23 0701 - 07/24 0700 In: 120 [P.O.:120] Out: -  Intake/Output this shift: No intake/output data recorded.  General appearance: alert, cooperative, and no distress Neurologic: intact Heart: sinus tachycardia, no murmur Lungs: slightly diminished right sided basilar breath sounds  Extremities: extremities normal, atraumatic, no cyanosis or edema Wound: Clean and dry dressing in place, no kinking in tubing  Lab Results: Recent Labs    01/08/24 0159  WBC 7.0  HGB 10.9*  HCT 31.4*  PLT 327   BMET:  Recent Labs    01/08/24 0159  NA 136  K 3.8  CL 102  CO2 26  GLUCOSE 117*  BUN 13  CREATININE 0.68  CALCIUM 8.8*    PT/INR: No results for input(s): LABPROT, INR in the last 72 hours. ABG    Component Value Date/Time   PHART 7.447 01/02/2024 0601   HCO3 24.9 01/02/2024 0601   TCO2 26 01/02/2024 0601   ACIDBASEDEF 1.0 12/31/2023 1540   O2SAT 97 01/02/2024 0601   CBG (last 3)  No results for input(s): GLUCAP in the last 72 hours.  Assessment/Plan: S/P Procedure(s) (LRB): ROBOTIC ASSISTED RIGHT VIDEO ASSISTED THORACOSCOPIC SURGERY, BLEB RESECTION, PLEURAL ABRASION  (Right) BLOCK, NERVE, INTERCOSTAL (Right)  Pulm: CXR with improved small right apical pneumothorax this AM. CT with persistent 2+ air leak with cough. Continue CT to waterseal. Saturating well on RA. Encourage IS and ambulation. Continue nebs and mucinex  for congestion.    Dispo: Continue chest tube for persistent air leak, primary team managing patient    LOS: 12 days    Con GORMAN Bend, PA-C 01/10/2024   Chart reviewed, patient examined, agree with above.  CXR looks stable with no significant pneumothorax. He has persistent 2+ air leak with cough but not deep breathing. Will switch to [employer] and if CXR looks ok in am and air leak persists I think he could go home with [employer]. He feels he would be fine with that at home. His girlfriend is with him today and feels he could manage that easily.

## 2024-01-10 NOTE — Progress Notes (Signed)
 Mobility Specialist Progress Note;   01/10/24 0918  Mobility  Activity Ambulated independently in hallway  Level of Assistance Standby assist, set-up cues, supervision of patient - no hands on  Assistive Device None  Distance Ambulated (ft) 800 ft  Activity Response Tolerated well  Mobility Referral Yes  Mobility visit 1 Mobility  Mobility Specialist Start Time (ACUTE ONLY) [id number]  Mobility Specialist Stop Time (ACUTE ONLY) 0935  Mobility Specialist Time Calculation (min) (ACUTE ONLY) 17 min   Pt eager for mobility. Required no physical assistance during ambulation, SV. SPO2 92%> on RA. No c/o SOB when asked. Pt returned back to chair and left with all needs met, call bell in reach.   Lauraine Erm Mobility Specialist Please contact via SecureChat or [employer] [phone]

## 2024-01-10 NOTE — Progress Notes (Signed)
 Switched patient over to [employer] without any complications. Patient tolerated it well. Educated patient. Will continue to monitor patient.

## 2024-01-10 NOTE — Progress Notes (Signed)
 PROGRESS NOTE  Albert Hill FMW:[id number] DOB: [birth year]   PCP: Lorren Greig PARAS, NP  Patient is from: Home.  Independently ambulates at baseline  DOA: 12/29/2023 LOS: 12  Chief complaints Chief Complaint  Patient presents with   Shortness of Breath     Brief Narrative / Interim history: 48 year old man w/ hx of COPD, OSA, HTN who presented with recurrent right pneumothorax. After discussion, underwent VATS w R apical bleb resection, pleural abrasion. Pre-op noted to have significant hypoxemia and WOB so left intubated. Extubated on 7/16, transferred care to [facility] on 7/18. Right sided chest tube in place,connected to water seal. CTS on board. He has persistent air leak.   Subjective: Seen and examined earlier this morning.  No major events overnight or this morning.  Right chest tube remains in place due to persistent right apical pneumothorax.  Objective: Vitals:   01/09/24 2301 01/10/24 0500 01/10/24 0741 01/10/24 1057  BP: 117/84  (!) 142/92   Pulse: 86  98   Resp: 17  17   Temp: 97.8 F (36.6 C)  98.3 F (36.8 C)   TempSrc: Oral  Oral   SpO2: 93%   93%  Weight:  73.7 kg    Height:        Examination:  GENERAL: No apparent distress.  Nontoxic. HEENT: MMM.  Vision and hearing grossly intact.  NECK: Supple.  No apparent JVD.  RESP:  No IWOB.  Fair aeration bilaterally.  Chest tube to right chest. CVS:  RRR. Heart sounds normal.  ABD/GI/GU: BS+. Abd soft, NTND.  MSK/EXT:  Moves extremities. No apparent deformity. No edema.  SKIN: no apparent skin lesion or wound NEURO: AA.  Oriented appropriately.  No apparent focal neuro deficit. PSYCH: Calm. Normal affect.   Consultants:  Cardiothoracic surgery   Microbiology summarized: None  Assessment and plan: Recurrent Pneumothorax,POA:  In the setting of bullous emphysema, continued tobacco abuse -S/p Right sided VATS -Extubated on 7/16. -CXR with improved small right apical pneumothorax -Chest tube to  waterseal per CTS.   -Daily CXR   COPD: Not in exacerbation: -Bronchodilators. -Encouraged smoking cessation   Hypertension: BP slightly elevated.   -Continue Lisinopril  20 mg. -P.o. hydralazine  as needed   Tobacco abuse: Advised strongly regarding tobacco cessation.   Disposition: Home, IADL  Body mass index is 23.3 kg/m.  Severe malnutrition Nutrition Problem: Severe Malnutrition Etiology: acute illness (recurrent pneumothorax) Signs/Symptoms: moderate fat depletion, moderate muscle depletion, severe muscle depletion Interventions: Tube feeding   DVT prophylaxis:  enoxaparin  (LOVENOX ) injection 40 mg Start: 01/01/24 2200 SCD's Start: 12/31/23 1518  Code Status: Full code Family Communication: Updated significant other at bedside. Level of care: Progressive Status is: Inpatient Remains inpatient appropriate because: Persistent pneumothorax   Final disposition: Home once medically cleared   35 minutes with more than 50% spent in reviewing records, counseling patient/family and coordinating care.   Sch Meds:  Scheduled Meds:  allopurinol   300 mg Oral Daily   arformoterol   15 mcg Nebulization BID   bisacodyl   10 mg Oral Daily   enoxaparin  (LOVENOX ) injection  40 mg Subcutaneous QHS   gabapentin   300 mg Oral TID   guaiFENesin   600 mg Oral BID   lisinopril   20 mg Oral Daily   multivitamin with minerals  1 tablet Oral Daily   nicotine   21 mg Transdermal Daily   pantoprazole   40 mg Oral Daily   polyethylene glycol  17 g Oral Daily   revefenacin   175 mcg Nebulization  Daily   senna-docusate  1 tablet Oral BID   Continuous Infusions: PRN Meds:.hydrALAZINE , ipratropium-albuterol , ketorolac , LORazepam , naphazoline-glycerin , ondansetron  **OR** ondansetron  (ZOFRAN ) IV, mouth rinse, oxyCODONE , phenol, temazepam , traMADol   Antimicrobials: Anti-infectives (From admission, onward)    Start     Dose/Rate Route Frequency Ordered Stop   01/01/24 1500   piperacillin -tazobactam (ZOSYN ) IVPB 3.375 g        3.375 g 12.5 mL/hr over 240 Minutes Intravenous Every 8 hours 01/01/24 0736 01/06/24 1032   01/01/24 1000  vancomycin  (VANCOREADY) IVPB 1500 mg/300 mL        1,500 mg 150 mL/hr over 120 Minutes Intravenous Every 12 hours 01/01/24 0744 01/05/24 2355   01/01/24 0830  piperacillin -tazobactam (ZOSYN ) IVPB 3.375 g        3.375 g 100 mL/hr over 30 Minutes Intravenous  Once 01/01/24 0736 01/01/24 0823   01/01/24 0830  vancomycin  (VANCOREADY) IVPB 1500 mg/300 mL  Status:  Discontinued        1,500 mg 150 mL/hr over 120 Minutes Intravenous Every 12 hours 01/01/24 0736 01/01/24 0744   12/31/23 1615  ceFAZolin  (ANCEF ) IVPB 2g/100 mL premix        2 g 200 mL/hr over 30 Minutes Intravenous Every 8 hours 12/31/23 1517 12/31/23 2350   12/30/23 1858  ceFAZolin  (ANCEF ) IVPB 2g/100 mL premix        2 g 200 mL/hr over 30 Minutes Intravenous 30 min pre-op 12/30/23 1858 12/31/23 1228        I have personally reviewed the following labs and images: CBC: Recent Labs  Lab 01/04/24 0248 01/05/24 0200 01/06/24 0235 01/07/24 0228 01/08/24 0159  WBC 9.2 9.4 8.3 6.8 7.0  HGB 10.6* 10.7* 10.2* 10.7* 10.9*  HCT 31.7* 31.1* 30.2* 30.3* 31.4*  MCV 90.3 89.9 91.8 88.9 88.7  PLT 244 251 245 272 327   BMP &GFR Recent Labs  Lab 01/04/24 0248 01/05/24 0200 01/06/24 0235 01/07/24 0228 01/08/24 0159  NA 136 134* 137 136 136  K 4.1 3.8 4.0 3.8 3.8  CL 102 101 102 100 102  CO2 26 25 24 27 26   GLUCOSE 104* 149* 106* 108* 117*  BUN 18 16 16 15 13   CREATININE 0.81 0.74 0.67 0.62 0.68  CALCIUM 8.2* 8.4* 8.5* 8.7* 8.8*  MG 1.8  --   --   --   --   PHOS 3.8  --   --   --   --    Estimated Creatinine Clearance: 115.3 mL/min (by C-G formula based on SCr of 0.68 mg/dL). Liver & Pancreas: No results for input(s): AST, ALT, ALKPHOS, BILITOT, PROT, ALBUMIN  in the last 168 hours. No results for input(s): LIPASE, AMYLASE in the last 168  hours. No results for input(s): AMMONIA in the last 168 hours. Diabetic: No results for input(s): HGBA1C in the last 72 hours. Recent Labs  Lab 01/05/24 1619 01/05/24 2027 01/06/24 0020 01/06/24 0448 01/06/24 0744  GLUCAP 97 122* 112* 94 109*   Cardiac Enzymes: No results for input(s): CKTOTAL, CKMB, CKMBINDEX, TROPONINI in the last 168 hours. No results for input(s): PROBNP in the last 8760 hours. Coagulation Profile: No results for input(s): INR, PROTIME in the last 168 hours. Thyroid Function Tests: No results for input(s): TSH, T4TOTAL, FREET4, T3FREE, THYROIDAB in the last 72 hours. Lipid Profile: No results for input(s): CHOL, HDL, LDLCALC, TRIG, CHOLHDL, LDLDIRECT in the last 72 hours. Anemia Panel: No results for input(s): VITAMINB12, FOLATE, FERRITIN, TIBC, IRON, RETICCTPCT in the last 72 hours. Urine analysis:  Component Value Date/Time   COLORURINE YELLOW (A) 01/04/2022 1207   APPEARANCEUR HAZY (A) 01/04/2022 1207   APPEARANCEUR Clear 01/22/2019 1023   LABSPEC 1.016 01/04/2022 1207   PHURINE 5.0 01/04/2022 1207   GLUCOSEU NEGATIVE 01/04/2022 1207   HGBUR NEGATIVE 01/04/2022 1207   BILIRUBINUR NEGATIVE 01/04/2022 1207   BILIRUBINUR Negative 01/22/2019 1023   KETONESUR NEGATIVE 01/04/2022 1207   PROTEINUR NEGATIVE 01/04/2022 1207   NITRITE NEGATIVE 01/04/2022 1207   LEUKOCYTESUR NEGATIVE 01/04/2022 1207   Sepsis Labs: Invalid input(s): PROCALCITONIN, LACTICIDVEN  Microbiology: No results found for this or any previous visit (from the past 240 hours).   Radiology Studies: DG Chest Port 1 View Result Date: 01/10/2024 CLINICAL DATA:  [id number] Pneumothorax, right [phone] EXAM: PORTABLE CHEST - 1 VIEW COMPARISON:  Multiple, most recently January 09, 2024 FINDINGS: Right thoracostomy tube is similarly positioned terminating in the right lung apex. Right apical pneumothorax is unchanged, measuring 2.6 cm at the  lung apex (previously, 2.5 cm, by my measurement). Persistent patchy opacities in the right lung base, likely atelectasis. Trace right pleural effusion. No cardiomegaly. No acute fracture or destructive lesion. IMPRESSION: Similar right apical pneumothorax measuring 2.6 cm with unchanged positioning of the right thoracostomy tube. Electronically Signed   By: Rogelia Myers M.D.   On: 01/10/2024 09:43      Evolette Pendell T. Izzie Geers [facility]  If 7PM-7AM, please contact night-coverage [email or web address] 01/10/2024, 11:10 AM

## 2024-01-11 ENCOUNTER — Inpatient Hospital Stay (HOSPITAL_COMMUNITY)

## 2024-01-11 DIAGNOSIS — N179 Acute kidney failure, unspecified: Secondary | ICD-10-CM | POA: Diagnosis not present

## 2024-01-11 DIAGNOSIS — J189 Pneumonia, unspecified organism: Secondary | ICD-10-CM | POA: Diagnosis not present

## 2024-01-11 DIAGNOSIS — J9383 Other pneumothorax: Secondary | ICD-10-CM | POA: Diagnosis not present

## 2024-01-11 DIAGNOSIS — E43 Unspecified severe protein-calorie malnutrition: Secondary | ICD-10-CM | POA: Diagnosis not present

## 2024-01-11 MED ORDER — HYDROMORPHONE HCL 1 MG/ML IJ SOLN
0.5000 mg | INTRAMUSCULAR | Status: DC | PRN
  Administered 2024-01-11: 0.5 mg via INTRAVENOUS
  Filled 2024-01-11: qty 0.5

## 2024-01-11 MED ORDER — HYDROMORPHONE HCL 1 MG/ML IJ SOLN
0.5000 mg | Freq: Once | INTRAMUSCULAR | Status: AC
  Administered 2024-01-11: 0.5 mg via INTRAVENOUS
  Filled 2024-01-11: qty 0.5

## 2024-01-11 MED ORDER — METHOCARBAMOL 500 MG PO TABS
750.0000 mg | ORAL_TABLET | Freq: Four times a day (QID) | ORAL | Status: DC | PRN
  Administered 2024-01-11 – 2024-01-14 (×8): 750 mg via ORAL
  Filled 2024-01-11 (×8): qty 2

## 2024-01-11 NOTE — Progress Notes (Signed)
 PROGRESS NOTE  Albert Hill FMW:[id number] DOB: 1975-08-17   PCP: Lorren Greig PARAS, NP  Patient is from: Home.  Independently ambulates at baseline  DOA: 12/29/2023 LOS: 13  Chief complaints Chief Complaint  Patient presents with   Shortness of Breath     Brief Narrative / Interim history: 48 year old man w/ hx of COPD, OSA, HTN who presented with recurrent right pneumothorax. After discussion, underwent VATS w R apical bleb resection, pleural abrasion. Pre-op noted to have significant hypoxemia and WOB so left intubated. Extubated on 7/16, transferred care to [facility] on 7/18. Right sided chest tube in place,connected to water seal. CTS on board. He has persistent air leak.   Subjective: Seen and examined earlier this morning.  He is somewhat sleepy but wakes to voice. Reports increased pain around the drain last night for which he received pain medications.  Pain improved earlier this morning.  However, he had increased pain again later in the morning.  CXR without significant change.  Objective: Vitals:   01/11/24 0333 01/11/24 0500 01/11/24 0739 01/11/24 1111  BP: 132/88  123/85 111/63  Pulse: 99 (!) 107 (!) 112 97  Resp:   18 16  Temp: 98 F (36.7 C)  98.3 F (36.8 C) 99.8 F (37.7 C)  TempSrc: Oral  Oral Oral  SpO2: 93% 95% 95%   Weight:  73 kg    Height:        Examination:  GENERAL: No apparent distress.  Nontoxic. HEENT: MMM.  Vision and hearing grossly intact.  NECK: Supple.  No apparent JVD.  RESP:  No IWOB.  Fair aeration bilaterally.  Chest tube connected to miniexpress CVS:  RRR. Heart sounds normal.  ABD/GI/GU: BS+. Abd soft, NTND.  MSK/EXT:  Moves extremities. No apparent deformity. No edema.  SKIN: no apparent skin lesion or wound NEURO: Sleepy but wakes to voice.  Not quite alert.  Fairly oriented.  No apparent focal neuro deficit. PSYCH: Calm. Normal affect.   Consultants:  Cardiothoracic surgery   Microbiology  summarized: None  Assessment and plan: Recurrent Pneumothorax,POA:  In the setting of bullous emphysema, continued tobacco abuse -S/p Right sided VATS -Extubated on 7/16. -CXR with improved small right apical pneumothorax -Chest tube connected to miniexpress -Patient with significant pain around chest tube despite multiple pain regimen -Cardiothoracic surgery on board.   COPD: Not in exacerbation: -Bronchodilators. -Encouraged smoking cessation   Hypertension: BP slightly elevated.   -Continue Lisinopril  20 mg. -P.o. hydralazine  as needed   Tobacco abuse: Advised strongly regarding tobacco cessation.   Disposition: Home, IADL  Severe malnutrition Body mass index is 23.1 kg/m. Nutrition Problem: Severe Malnutrition Etiology: acute illness (recurrent pneumothorax) Signs/Symptoms: moderate fat depletion, moderate muscle depletion, severe muscle depletion Interventions: Tube feeding   DVT prophylaxis:  enoxaparin  (LOVENOX ) injection 40 mg Start: 01/01/24 2200 SCD's Start: 12/31/23 1518  Code Status: Full code Family Communication: None at bedside. Level of care: Progressive Status is: Inpatient Remains inpatient appropriate because: Persistent pneumothorax   Final disposition: Home once medically cleared   35 minutes with more than 50% spent in reviewing records, counseling patient/family and coordinating care.   Sch Meds:  Scheduled Meds:  allopurinol   300 mg Oral Daily   arformoterol   15 mcg Nebulization BID   bisacodyl   10 mg Oral Daily   enoxaparin  (LOVENOX ) injection  40 mg Subcutaneous QHS   gabapentin   300 mg Oral TID   guaiFENesin   600 mg Oral BID   lisinopril   20 mg Oral  Daily   multivitamin with minerals  1 tablet Oral Daily   nicotine   21 mg Transdermal Daily   pantoprazole   40 mg Oral Daily   polyethylene glycol  17 g Oral Daily   revefenacin   175 mcg Nebulization Daily   senna-docusate  1 tablet Oral BID   Continuous Infusions: PRN  Meds:.hydrALAZINE , ipratropium-albuterol , ketorolac , LORazepam , methocarbamol , naphazoline-glycerin , ondansetron  **OR** ondansetron  (ZOFRAN ) IV, mouth rinse, oxyCODONE , phenol, temazepam , traMADol   Antimicrobials: Anti-infectives (From admission, onward)    Start     Dose/Rate Route Frequency Ordered Stop   01/01/24 1500  piperacillin -tazobactam (ZOSYN ) IVPB 3.375 g        3.375 g 12.5 mL/hr over 240 Minutes Intravenous Every 8 hours 01/01/24 0736 01/06/24 1032   01/01/24 1000  vancomycin  (VANCOREADY) IVPB 1500 mg/300 mL        1,500 mg 150 mL/hr over 120 Minutes Intravenous Every 12 hours 01/01/24 0744 01/05/24 2355   01/01/24 0830  piperacillin -tazobactam (ZOSYN ) IVPB 3.375 g        3.375 g 100 mL/hr over 30 Minutes Intravenous  Once 01/01/24 0736 01/01/24 0823   01/01/24 0830  vancomycin  (VANCOREADY) IVPB 1500 mg/300 mL  Status:  Discontinued        1,500 mg 150 mL/hr over 120 Minutes Intravenous Every 12 hours 01/01/24 0736 01/01/24 0744   12/31/23 1615  ceFAZolin  (ANCEF ) IVPB 2g/100 mL premix        2 g 200 mL/hr over 30 Minutes Intravenous Every 8 hours 12/31/23 1517 12/31/23 2350   12/30/23 1858  ceFAZolin  (ANCEF ) IVPB 2g/100 mL premix        2 g 200 mL/hr over 30 Minutes Intravenous 30 min pre-op 12/30/23 1858 12/31/23 1228        I have personally reviewed the following labs and images: CBC: Recent Labs  Lab 01/05/24 0200 01/06/24 0235 01/07/24 0228 01/08/24 0159  WBC 9.4 8.3 6.8 7.0  HGB 10.7* 10.2* 10.7* 10.9*  HCT 31.1* 30.2* 30.3* 31.4*  MCV 89.9 91.8 88.9 88.7  PLT 251 245 272 327   BMP &GFR Recent Labs  Lab 01/05/24 0200 01/06/24 0235 01/07/24 0228 01/08/24 0159  NA 134* 137 136 136  K 3.8 4.0 3.8 3.8  CL 101 102 100 102  CO2 25 24 27 26   GLUCOSE 149* 106* 108* 117*  BUN 16 16 15 13   CREATININE 0.74 0.67 0.62 0.68  CALCIUM 8.4* 8.5* 8.7* 8.8*   Estimated Creatinine Clearance: 115.3 mL/min (by C-G formula based on SCr of 0.68 mg/dL). Liver &  Pancreas: No results for input(s): AST, ALT, ALKPHOS, BILITOT, PROT, ALBUMIN  in the last 168 hours. No results for input(s): LIPASE, AMYLASE in the last 168 hours. No results for input(s): AMMONIA in the last 168 hours. Diabetic: No results for input(s): HGBA1C in the last 72 hours. Recent Labs  Lab 01/05/24 1619 01/05/24 2027 01/06/24 0020 01/06/24 0448 01/06/24 0744  GLUCAP 97 122* 112* 94 109*   Cardiac Enzymes: No results for input(s): CKTOTAL, CKMB, CKMBINDEX, TROPONINI in the last 168 hours. No results for input(s): PROBNP in the last 8760 hours. Coagulation Profile: No results for input(s): INR, PROTIME in the last 168 hours. Thyroid Function Tests: No results for input(s): TSH, T4TOTAL, FREET4, T3FREE, THYROIDAB in the last 72 hours. Lipid Profile: No results for input(s): CHOL, HDL, LDLCALC, TRIG, CHOLHDL, LDLDIRECT in the last 72 hours. Anemia Panel: No results for input(s): VITAMINB12, FOLATE, FERRITIN, TIBC, IRON, RETICCTPCT in the last 72 hours. Urine analysis:  Component Value Date/Time   COLORURINE YELLOW (A) 01/04/2022 1207   APPEARANCEUR HAZY (A) 01/04/2022 1207   APPEARANCEUR Clear 01/22/2019 1023   LABSPEC 1.016 01/04/2022 1207   PHURINE 5.0 01/04/2022 1207   GLUCOSEU NEGATIVE 01/04/2022 1207   HGBUR NEGATIVE 01/04/2022 1207   BILIRUBINUR NEGATIVE 01/04/2022 1207   BILIRUBINUR Negative 01/22/2019 1023   KETONESUR NEGATIVE 01/04/2022 1207   PROTEINUR NEGATIVE 01/04/2022 1207   NITRITE NEGATIVE 01/04/2022 1207   LEUKOCYTESUR NEGATIVE 01/04/2022 1207   Sepsis Labs: Invalid input(s): PROCALCITONIN, LACTICIDVEN  Microbiology: No results found for this or any previous visit (from the past 240 hours).   Radiology Studies: DG CHEST PORT 1 VIEW Result Date: 01/11/2024 CLINICAL DATA:  Pneumothorax, status post bleb resection pleural abrasion EXAM: PORTABLE CHEST 1 VIEW 11:48 : 31  a.m. COMPARISON:  Chest portable January 11, 2024 the FINDINGS: Small right apical pneumothorax similar to prior. Basilar component of pneumothorax is resolved. Right apical chest tube in place. Similar appearance of blunting of right costophrenic angle and right lung base ill-defined opacities. Left lung is clear. Cardiomediastinal silhouette is normal. No osseous abnormality. IMPRESSION: Stable right apical pneumothorax. Resolution of basilar component of pneumothorax. Right lung base and costophrenic ill-defined opacities, increased to prior likely representing atelectasis. Electronically Signed   By: Megan  Zare M.D.   On: 01/11/2024 12:07   DG CHEST PORT 1 VIEW Result Date: 01/11/2024 EXAM: 1 VIEW XRAY OF THE CHEST 01/11/2024 03:03:00 AM COMPARISON: 01/10/2024 CLINICAL HISTORY: Chest pain; shortness of breath; recurrent pneumothorax. FINDINGS: LUNGS AND PLEURA: Stable small right hydropneumothorax with dominant basilar pneumothorax component. No cardiomediastinal shift to suggest tension physiology. HEART AND MEDIASTINUM: No acute abnormality of the cardiac and mediastinal silhouettes. BONES AND SOFT TISSUES: Stable right apical chest tube. No acute osseous abnormality. IMPRESSION: 1. Stable small right hydropneumothorax with dominant basilar pneumothorax component. 2. Stable right apical chest tube. Electronically signed by: Pinkie Pebbles MD 01/11/2024 03:10 AM EDT RP Workstation: [id number]      Adalin Vanderploeg T. Simmone Cape [facility]  If 7PM-7AM, please contact night-coverage [email or web address] 01/11/2024, 1:47 PM

## 2024-01-11 NOTE — Progress Notes (Addendum)
 Patient stating he is still has some right chest/flank pain despite getting iv pain medication. He also states it feels a burning sensations. Paged Attending and informed that patient had already gotten a chest xray. Got orders for Robaxin . Will reassess patient.   1515: Patient was able to fall asleep and states it only feels sore at the moment.   1720: Patient stating he is having right sided thoracic chest pain and asking if he can have iv dilaudid . Gave PRN oxycodone  and will paged on call for more iv pain medication.

## 2024-01-11 NOTE — Progress Notes (Addendum)
      [address] [place] [address]             [phone]      When I arrived to check on the patient he was hunched over reporting he was having a lot of pain since he woke up from his nap. He does not report shortness of breath but has 3L of O2  and was saturating in the low 90s. He was also sinus tachycardic up to 115. BP remained stable. I ordered Dilaudid  for pain. Chest tube looked stable without kink and there is no subQ air present. Stat CXR was ordered, bedside it looked like the apical pneumothorax has remained stable but basilar component looks to have filled with fluid. Could possibly be causing discomfort? Pending final CXR read. Will discuss with surgeon whether we can continue to monitor on miniexpress or if we need to transition back to [place].   Addendum: Discussed with Dr. Lucas and will continue to monitor on miniexpress since there is no difference between sahara to water seal and miniexpress. CXR read as stable right apical pneumothorax with resolution of basilar component and increased right lung base opacities likely representing atelectasis. No clear reason for increased pain. Will continue to monitor closely on mini express with CXR in the AM.   Con GORMAN [place], PA-C 01/11/24  Today's Vitals   01/11/24 0910 01/11/24 1111 01/11/24 1137 01/11/24 1144  BP:  111/63    Pulse:  97    Resp:  16    Temp:  99.8 F (37.7 C)    TempSrc:  Oral    SpO2:      Weight:      Height:      PainSc: 6   8  8     Body mass index is 23.1 kg/m.    Chart reviewed, patient examined, agree with above.  He feels better after pain meds and ice pack to chest wall. Will continue to observe today and may be able to get home this weekend.

## 2024-01-11 NOTE — TOC Progression Note (Signed)
 Transition of Care [facility]) - Progression Note    Patient Details  Name: Albert Hill MRN: [id number] Date of Birth: 12/25/1975  [facility] CM/SW Contact  Andrez JULIANNA George, RN Phone Number: 01/11/2024, 2:38 PM  Clinical Narrative:     Home health RN arranged with [facility]. [facility] will contact him for the first home visit. Information is on the AVS.  IP Care Management following.   Expected Discharge Plan: Home w Home Health Services Barriers to Discharge: Continued Medical Work up               Expected Discharge Plan and Services In-house Referral: NA Discharge Planning Services: CM Consult Post Acute Care Choice: Home Health Living arrangements for the past 2 months: Single Family Home (Sister Michaelle states patient cannot return to [place] with his girlfriend)                   DME Agency: NA       HH Arranged: RN HH Agency: [facility] Date [facility] Agency Contacted: 01/11/24   Representative spoke with at [facility] Agency: Darleene   Social Drivers of Health (SDOH) Interventions SDOH Screenings   Food Insecurity: No Food Insecurity (12/30/2023)  Recent Concern: Food Insecurity - Food Insecurity Present (12/24/2023)  Housing: Low Risk  (12/30/2023)  Recent Concern: Housing - High Risk (12/24/2023)  Transportation Needs: No Transportation Needs (12/30/2023)  Recent Concern: Transportation Needs - Unmet Transportation Needs (12/24/2023)  Utilities: Not At Risk (12/30/2023)  Alcohol Screen: Low Risk  (12/24/2023)  Depression (PHQ2-9): High Risk (12/24/2023)  Financial Resource Strain: High Risk (12/24/2023)  Physical Activity: Inactive (12/24/2023)  Social Connections: Moderately Integrated (12/24/2023)  Stress: Stress Concern Present (12/24/2023)  Tobacco Use: High Risk (12/31/2023)  Health Literacy: Adequate Health Literacy (12/24/2023)    Readmission Risk Interventions     No data to display

## 2024-01-11 NOTE — Progress Notes (Addendum)
      [address] [employer] [address]             [phone]      11 Days Post-Op Procedure(s) (LRB): ROBOTIC ASSISTED RIGHT VIDEO ASSISTED THORACOSCOPIC SURGERY, BLEB RESECTION, PLEURAL ABRASION (Right) BLOCK, NERVE, INTERCOSTAL (Right) Subjective: Patient reports he had increased pain around the tube overnight  Objective: Vital signs in last 24 hours: Temp:  [97.9 F (36.6 C)-98.3 F (36.8 C)] 98.3 F (36.8 C) (07/25 0739) Pulse Rate:  [76-112] 112 (07/25 0739) Cardiac Rhythm: Sinus tachycardia (07/25 0700) Resp:  [16-18] 18 (07/25 0739) BP: (112-140)/(60-92) 123/85 (07/25 0739) SpO2:  [93 %-95 %] 95 % (07/25 0739) Weight:  [73 kg] 73 kg (07/25 0500)  Hemodynamic parameters for last 24 hours:    Intake/Output from previous day: 07/24 0701 - 07/25 0700 In: 360 [P.O.:360] Out: 10 [Chest Tube:10] Intake/Output this shift: No intake/output data recorded.  General appearance: alert, cooperative, and no distress Neurologic: intact Heart: sinus tachycardia, no murmur Lungs: slight wheezes throughout Wound: Clean and dry without sign of infection, clean and dry dressing in place  Lab Results: No results for input(s): WBC, HGB, HCT, PLT in the last 72 hours. BMET: No results for input(s): NA, K, CL, CO2, GLUCOSE, BUN, CREATININE, CALCIUM in the last 72 hours.  PT/INR: No results for input(s): LABPROT, INR in the last 72 hours. ABG    Component Value Date/Time   PHART 7.447 01/02/2024 0601   HCO3 24.9 01/02/2024 0601   TCO2 26 01/02/2024 0601   ACIDBASEDEF 1.0 12/31/2023 1540   O2SAT 97 01/02/2024 0601   CBG (last 3)  No results for input(s): GLUCAP in the last 72 hours.  Assessment/Plan: S/P Procedure(s) (LRB): ROBOTIC ASSISTED RIGHT VIDEO ASSISTED THORACOSCOPIC SURGERY, BLEB RESECTION, PLEURAL ABRASION (Right) BLOCK, NERVE, INTERCOSTAL (Right)  Pulm: CXR with stable small right apical pneumothorax this AM.  CT with persistent 2+ air leak with cough. CT to mini express yesterday. Saturating well on RA. Patient reports increased pain overnight but CXR is stable and he denies increased shortness of breath. No subQ air. Encourage IS and ambulation. Continue nebs and mucinex  for congestion.    Dispo: Since patient had increased pain overnight will check on patient later today for possible discharge later today. Close follow up arranged. D/C per primary team.   LOS: 13 days    Con GORMAN [place], PA-C 01/11/2024   Chart reviewed, patient examined, agree with above.  CXR looks fine this am. He has continued air leak. His pain sound neuropathic and described as burning poker sticking in anterior chest over costal margin. May be related to the chest tube. Will keep to [facility] and check CXR in am.

## 2024-01-12 ENCOUNTER — Inpatient Hospital Stay (HOSPITAL_COMMUNITY)

## 2024-01-12 DIAGNOSIS — N179 Acute kidney failure, unspecified: Secondary | ICD-10-CM

## 2024-01-12 DIAGNOSIS — J189 Pneumonia, unspecified organism: Secondary | ICD-10-CM | POA: Diagnosis not present

## 2024-01-12 DIAGNOSIS — J984 Other disorders of lung: Secondary | ICD-10-CM | POA: Diagnosis not present

## 2024-01-12 DIAGNOSIS — J9383 Other pneumothorax: Secondary | ICD-10-CM | POA: Diagnosis not present

## 2024-01-12 DIAGNOSIS — J939 Pneumothorax, unspecified: Secondary | ICD-10-CM | POA: Diagnosis not present

## 2024-01-12 DIAGNOSIS — Z4682 Encounter for fitting and adjustment of non-vascular catheter: Secondary | ICD-10-CM | POA: Diagnosis not present

## 2024-01-12 DIAGNOSIS — E43 Unspecified severe protein-calorie malnutrition: Secondary | ICD-10-CM | POA: Diagnosis not present

## 2024-01-12 LAB — CBC
HCT: 31.1 % — ABNORMAL LOW (ref 39.0–52.0)
Hemoglobin: 10.8 g/dL — ABNORMAL LOW (ref 13.0–17.0)
MCH: 30.9 pg (ref 26.0–34.0)
MCHC: 34.7 g/dL (ref 30.0–36.0)
MCV: 88.9 fL (ref 80.0–100.0)
Platelets: 344 K/uL (ref 150–400)
RBC: 3.5 MIL/uL — ABNORMAL LOW (ref 4.22–5.81)
RDW: 12.9 % (ref 11.5–15.5)
WBC: 17.8 K/uL — ABNORMAL HIGH (ref 4.0–10.5)
nRBC: 0 % (ref 0.0–0.2)

## 2024-01-12 LAB — RENAL FUNCTION PANEL
Albumin: 2.3 g/dL — ABNORMAL LOW (ref 3.5–5.0)
Anion gap: 9 (ref 5–15)
BUN: 27 mg/dL — ABNORMAL HIGH (ref 6–20)
CO2: 26 mmol/L (ref 22–32)
Calcium: 8.6 mg/dL — ABNORMAL LOW (ref 8.9–10.3)
Chloride: 95 mmol/L — ABNORMAL LOW (ref 98–111)
Creatinine, Ser: 1.08 mg/dL (ref 0.61–1.24)
GFR, Estimated: >60 mL/min (ref >60)
Glucose, Bld: 127 mg/dL — ABNORMAL HIGH (ref 70–99)
Phosphorus: 3.6 mg/dL (ref 2.5–4.6)
Potassium: 4 mmol/L (ref 3.5–5.1)
Sodium: 130 mmol/L — ABNORMAL LOW (ref 135–145)

## 2024-01-12 LAB — MRSA NEXT GEN BY PCR, NASAL: MRSA by PCR Next Gen: NOT DETECTED

## 2024-01-12 LAB — MAGNESIUM: Magnesium: 1.7 mg/dL (ref 1.7–2.4)

## 2024-01-12 LAB — PROCALCITONIN: Procalcitonin: 1.79 ng/mL

## 2024-01-12 MED ORDER — DEXTROSE 50 % IV SOLN
INTRAVENOUS | Status: AC
  Filled 2024-01-12: qty 50

## 2024-01-12 MED ORDER — GLUCOSE 40 % PO GEL
ORAL | Status: AC
  Filled 2024-01-12: qty 1.21

## 2024-01-12 MED ORDER — DOXYCYCLINE HYCLATE 100 MG PO TABS
100.0000 mg | ORAL_TABLET | Freq: Two times a day (BID) | ORAL | Status: DC
  Administered 2024-01-12 – 2024-01-14 (×5): 100 mg via ORAL
  Filled 2024-01-12 (×5): qty 1

## 2024-01-12 MED ORDER — SODIUM CHLORIDE 0.9 % IV SOLN
2.0000 g | INTRAVENOUS | Status: DC
  Administered 2024-01-12 – 2024-01-14 (×3): 2 g via INTRAVENOUS
  Filled 2024-01-12 (×3): qty 20

## 2024-01-12 NOTE — Progress Notes (Signed)
 PROGRESS NOTE  Albert Hill FMW:[id number] DOB: Nov 10, 1975   PCP: Lorren Greig PARAS, NP  Patient is from: Home.  Independently ambulates at baseline  DOA: 12/29/2023 LOS: 14  Chief complaints Chief Complaint  Patient presents with   Shortness of Breath     Brief Narrative / Interim history: 48 year old man w/ hx of COPD, OSA, HTN who presented with recurrent right pneumothorax. After discussion, underwent VATS w R apical bleb resection, pleural abrasion. Pre-op noted to have significant hypoxemia and WOB so left intubated. Extubated on 7/16, transferred care to [facility] on 7/18. Right sided chest tube now connected to [facility]xpress.   Subjective: Seen and examined earlier this morning.  No major events overnight or this morning.  Reports significant improvement in his pain this morning.  He had leukocytosis 17.8 (was 7.0 on 7/21).  Creatinine trended up as well.  CXR showed stable right chest tube without pneumothorax but slightly increased bibasilar airspace disease..  Objective: Vitals:   01/12/24 0425 01/12/24 0500 01/12/24 0808 01/12/24 1101  BP: (!) 109/59  102/70 111/72  Pulse: 100 (!) 106 96   Resp: 20  14 16   Temp: 98.3 F (36.8 C)  98.3 F (36.8 C) 98.5 F (36.9 C)  TempSrc: Oral  Oral Oral  SpO2: 92% 94% 95% 95%  Weight:  73.8 kg    Height:        Examination:  GENERAL: No apparent distress.  Nontoxic. HEENT: MMM.  Vision and hearing grossly intact.  NECK: Supple.  No apparent JVD.  RESP:  No IWOB.  Fair aeration bilaterally.  Chest tube connected to miniexpress CVS:  RRR. Heart sounds normal.  ABD/GI/GU: BS+. Abd soft, NTND.  MSK/EXT:  Moves extremities. No apparent deformity. No edema.  SKIN: no apparent skin lesion or wound NEURO: Sleepy but wakes to voice.  Not quite alert.  Fairly oriented.  No apparent focal neuro deficit. PSYCH: Calm. Normal affect.   Consultants:  Cardiothoracic surgery   Microbiology summarized: None  Assessment and  plan: Recurrent Pneumothorax,POA:  In the setting of bullous emphysema, continued tobacco abuse -S/p Right sided VATS -Extubated on 7/16. -Chest tube connected to miniexpress -CXR showed stable right chest tube without pneumothorax but slightly increased bibasilar airspace disease. -Appreciate input by cardiothoracic surgery. - Pain control  Bibasilar pneumonia: CXR from this morning raises concern for slightly increased bibasilar infiltrate.  WBC 17.8 (was 7.0 on 7/21).  Has no fever or respiratory distress. -Start ceftriaxone  and doxycycline  -Check MRSA PCR screen procalcitonin -Continue trending leukocytosis  AKI: Baseline creatinine 0.6-0.7.  Likely due to NSAID and lisinopril . Recent Labs    12/30/23 2205 01/01/24 0523 01/02/24 0450 01/03/24 0355 01/04/24 0248 01/05/24 0200 01/06/24 0235 01/07/24 0228 01/08/24 0159 01/12/24 0226  BUN 15 20 21* 15 18 16 16 15 13  27*  CREATININE 0.76 0.82 0.71 0.72 0.81 0.74 0.67 0.62 0.68 1.08  -Discontinue Toradol  and lisinopril  - Recheck in the morning  COPD: Not in exacerbation: -Bronchodilators. -Encouraged smoking cessation   Hypertension: Normotensive. -P.o. hydralazine  as needed -Hold lisinopril  in the setting of AKI   Tobacco abuse: Advised strongly regarding tobacco cessation.  Leukocytosis: Due to pneumonia?  Could be demargination from pain -Antibiotics as above -Recheck in the morning   Disposition: Home, IADL  Severe malnutrition Body mass index is 23.36 kg/m. Nutrition Problem: Severe Malnutrition Etiology: acute illness (recurrent pneumothorax) Signs/Symptoms: moderate fat depletion, moderate muscle depletion, severe muscle depletion Interventions: Tube feeding   DVT prophylaxis:  enoxaparin  (LOVENOX ) injection  40 mg Start: 01/01/24 2200 SCD's Start: 12/31/23 1518  Code Status: Full code Family Communication: None at bedside. Level of care: Progressive Status is: Inpatient Remains inpatient  appropriate because: Persistent pneumothorax   Final disposition: Home once medically cleared   55 minutes with more than 50% spent in reviewing records, counseling patient/family and coordinating care.   Sch Meds:  Scheduled Meds:  allopurinol   300 mg Oral Daily   arformoterol   15 mcg Nebulization BID   bisacodyl   10 mg Oral Daily   dextrose        dextrose        doxycycline   100 mg Oral Q12H   enoxaparin  (LOVENOX ) injection  40 mg Subcutaneous QHS   gabapentin   300 mg Oral TID   guaiFENesin   600 mg Oral BID   multivitamin with minerals  1 tablet Oral Daily   nicotine   21 mg Transdermal Daily   pantoprazole   40 mg Oral Daily   polyethylene glycol  17 g Oral Daily   revefenacin   175 mcg Nebulization Daily   senna-docusate  1 tablet Oral BID   Continuous Infusions:  cefTRIAXone  (ROCEPHIN )  IV 2 g (01/12/24 0932)   PRN Meds:.dextrose , dextrose , hydrALAZINE , HYDROmorphone  (DILAUDID ) injection, ipratropium-albuterol , LORazepam , methocarbamol , naphazoline-glycerin , ondansetron  **OR** ondansetron  (ZOFRAN ) IV, mouth rinse, oxyCODONE , phenol, temazepam , traMADol   Antimicrobials: Anti-infectives (From admission, onward)    Start     Dose/Rate Route Frequency Ordered Stop   01/12/24 0845  cefTRIAXone  (ROCEPHIN ) 2 g in sodium chloride  0.9 % 100 mL IVPB        2 g 200 mL/hr over 30 Minutes Intravenous Every 24 hours 01/12/24 0745     01/12/24 0845  doxycycline  (VIBRA -TABS) tablet 100 mg        100 mg Oral Every 12 hours 01/12/24 0745 01/17/24 0959   01/01/24 1500  piperacillin -tazobactam (ZOSYN ) IVPB 3.375 g        3.375 g 12.5 mL/hr over 240 Minutes Intravenous Every 8 hours 01/01/24 0736 01/06/24 1032   01/01/24 1000  vancomycin  (VANCOREADY) IVPB 1500 mg/300 mL        1,500 mg 150 mL/hr over 120 Minutes Intravenous Every 12 hours 01/01/24 0744 01/05/24 2355   01/01/24 0830  piperacillin -tazobactam (ZOSYN ) IVPB 3.375 g        3.375 g 100 mL/hr over 30 Minutes Intravenous  Once  01/01/24 0736 01/01/24 0823   01/01/24 0830  vancomycin  (VANCOREADY) IVPB 1500 mg/300 mL  Status:  Discontinued        1,500 mg 150 mL/hr over 120 Minutes Intravenous Every 12 hours 01/01/24 0736 01/01/24 0744   12/31/23 1615  ceFAZolin  (ANCEF ) IVPB 2g/100 mL premix        2 g 200 mL/hr over 30 Minutes Intravenous Every 8 hours 12/31/23 1517 12/31/23 2350   12/30/23 1858  ceFAZolin  (ANCEF ) IVPB 2g/100 mL premix        2 g 200 mL/hr over 30 Minutes Intravenous 30 min pre-op 12/30/23 1858 12/31/23 1228        I have personally reviewed the following labs and images: CBC: Recent Labs  Lab 01/06/24 0235 01/07/24 0228 01/08/24 0159 01/12/24 0226  WBC 8.3 6.8 7.0 17.8*  HGB 10.2* 10.7* 10.9* 10.8*  HCT 30.2* 30.3* 31.4* 31.1*  MCV 91.8 88.9 88.7 88.9  PLT 245 272 327 344   BMP &GFR Recent Labs  Lab 01/06/24 0235 01/07/24 0228 01/08/24 0159 01/12/24 0226  NA 137 136 136 130*  K 4.0 3.8 3.8 4.0  CL 102 100  102 95*  CO2 24 27 26 26   GLUCOSE 106* 108* 117* 127*  BUN 16 15 13  27*  CREATININE 0.67 0.62 0.68 1.08  CALCIUM 8.5* 8.7* 8.8* 8.6*  MG  --   --   --  1.7  PHOS  --   --   --  3.6   Estimated Creatinine Clearance: 85.4 mL/min (by C-G formula based on SCr of 1.08 mg/dL). Liver & Pancreas: Recent Labs  Lab 01/12/24 0226  ALBUMIN  2.3*   No results for input(s): LIPASE, AMYLASE in the last 168 hours. No results for input(s): AMMONIA in the last 168 hours. Diabetic: No results for input(s): HGBA1C in the last 72 hours. Recent Labs  Lab 01/05/24 1619 01/05/24 2027 01/06/24 0020 01/06/24 0448 01/06/24 0744  GLUCAP 97 122* 112* 94 109*   Cardiac Enzymes: No results for input(s): CKTOTAL, CKMB, CKMBINDEX, TROPONINI in the last 168 hours. No results for input(s): PROBNP in the last 8760 hours. Coagulation Profile: No results for input(s): INR, PROTIME in the last 168 hours. Thyroid Function Tests: No results for input(s): TSH,  T4TOTAL, FREET4, T3FREE, THYROIDAB in the last 72 hours. Lipid Profile: No results for input(s): CHOL, HDL, LDLCALC, TRIG, CHOLHDL, LDLDIRECT in the last 72 hours. Anemia Panel: No results for input(s): VITAMINB12, FOLATE, FERRITIN, TIBC, IRON, RETICCTPCT in the last 72 hours. Urine analysis:    Component Value Date/Time   COLORURINE YELLOW (A) 01/04/2022 1207   APPEARANCEUR HAZY (A) 01/04/2022 1207   APPEARANCEUR Clear 01/22/2019 1023   LABSPEC 1.016 01/04/2022 1207   PHURINE 5.0 01/04/2022 1207   GLUCOSEU NEGATIVE 01/04/2022 1207   HGBUR NEGATIVE 01/04/2022 1207   BILIRUBINUR NEGATIVE 01/04/2022 1207   BILIRUBINUR Negative 01/22/2019 1023   KETONESUR NEGATIVE 01/04/2022 1207   PROTEINUR NEGATIVE 01/04/2022 1207   NITRITE NEGATIVE 01/04/2022 1207   LEUKOCYTESUR NEGATIVE 01/04/2022 1207   Sepsis Labs: Invalid input(s): PROCALCITONIN, LACTICIDVEN  Microbiology: No results found for this or any previous visit (from the past 240 hours).   Radiology Studies: DG Chest Port 1 View Result Date: 01/12/2024 CLINICAL DATA:  [id number] Pneumothorax, right [phone] EXAM: PORTABLE CHEST - 1 VIEW COMPARISON:  01/11/2024 FINDINGS: Stable right chest tube directed to the apex. No conspicuous pneumothorax. Coarse bibasilar airspace opacities right greater than left slightly increased. Heart size and mediastinal contours are within normal limits. Blunting of right lateral costophrenic angle suggesting small effusion as before. Visualized bones unremarkable. IMPRESSION: 1. Stable right chest tube without pneumothorax. 2. Slightly increased bibasilar airspace disease. Electronically Signed   By: JONETTA Faes M.D.   On: 01/12/2024 07:32      Donata Reddick T. Rochella Benner [facility]  If 7PM-7AM, please contact night-coverage [email or web address] 01/12/2024, 12:44 PM

## 2024-01-12 NOTE — Progress Notes (Signed)
 Patient requesting IV Ativan  this evening. Patient drowsy and bed was wet due to patient spilling his cup of soda in the bed. Patient's O2 also dropped to 80 and required 2L of O2, I told patient that he was too lethargic for IV ativan  at this time. At 2200 patient had received all of his night time meds and also PRN 10 Oxy and at 2030 patient received Robaxin .

## 2024-01-12 NOTE — Progress Notes (Addendum)
      [address]       [place] [address]             [phone]       12 Days Post-Op Procedure(s) (LRB): ROBOTIC ASSISTED RIGHT VIDEO ASSISTED THORACOSCOPIC SURGERY, BLEB RESECTION, PLEURAL ABRASION (Right) BLOCK, NERVE, INTERCOSTAL (Right)  Subjective: Patient no longer with increased pain on the right (chest tube, surgery). He is much more comfortable.   Objective: Vital signs in last 24 hours: Temp:  [97.6 F (36.4 C)-99.8 F (37.7 C)] 98.3 F (36.8 C) (07/26 0808) Pulse Rate:  [89-115] 96 (07/26 0808) Cardiac Rhythm: Sinus tachycardia (07/26 0806) Resp:  [14-20] 14 (07/26 0808) BP: (88-120)/(50-81) 102/70 (07/26 0808) SpO2:  [91 %-100 %] 95 % (07/26 0808) Weight:  [73.8 kg] 73.8 kg (07/26 0500)      Intake/Output from previous day: 07/25 0701 - 07/26 0700 In: 960 [P.O.:960] Out: 40 [Chest Tube:40]   Physical Exam:  Cardiovascular: RRR Pulmonary: Clear to auscultation bilaterally Extremities: No lower extremity edema. Wounds: Clean and dry.  No erythema or signs of infection. Chest Tube: to mini express. Some light yellow drainage around chest tube wound.   Lab Results: CBC: Recent Labs    01/12/24 0226  WBC 17.8*  HGB 10.8*  HCT 31.1*  PLT 344   BMET:  Recent Labs    01/12/24 0226  NA 130*  K 4.0  CL 95*  CO2 26  GLUCOSE 127*  BUN 27*  CREATININE 1.08  CALCIUM 8.6*    PT/INR: No results for input(s): LABPROT, INR in the last 72 hours. ABG:  INR: Will add last result for INR, ABG once components are confirmed Will add last 4 CBG results once components are confirmed  Assessment/Plan:  1. CV - ST at times.  2.  Pulmonary - History of COPD. Continue nebs/inhalers. On room air. Chest tube is to mini express. CXR this am shows no pneumothorax, small right pleural effusion/atelectasis. Encourage incentive spirometer. 3. ID-on Ceftriaxone  and Doxycycline  4. On Lovenox  for DVT prophylaxis 5. Tobacco abuse-nicotine   patch, encouraged cessation 6. Follow up with CVTS arranged  Donielle M ZimmermanPA-C 01/12/2024,8:36 AM   Chart reviewed, patient examined, agree with above.  He feels ok. Pain is much better. CXR stable and there is persistent small air leak. WBC increased to 17.8 from 7.0 on 7/21. No fever. Started on antibiotics. Small amount of drainage around the chest tube site but otherwise no clear site of infection. Continue antibiotic and followup WBC ct.

## 2024-01-12 NOTE — Plan of Care (Signed)

## 2024-01-13 ENCOUNTER — Inpatient Hospital Stay (HOSPITAL_COMMUNITY)

## 2024-01-13 DIAGNOSIS — N179 Acute kidney failure, unspecified: Secondary | ICD-10-CM | POA: Diagnosis not present

## 2024-01-13 DIAGNOSIS — E43 Unspecified severe protein-calorie malnutrition: Secondary | ICD-10-CM | POA: Diagnosis not present

## 2024-01-13 DIAGNOSIS — R590 Localized enlarged lymph nodes: Secondary | ICD-10-CM | POA: Diagnosis not present

## 2024-01-13 DIAGNOSIS — J948 Other specified pleural conditions: Secondary | ICD-10-CM | POA: Diagnosis not present

## 2024-01-13 DIAGNOSIS — R918 Other nonspecific abnormal finding of lung field: Secondary | ICD-10-CM | POA: Diagnosis not present

## 2024-01-13 DIAGNOSIS — J9383 Other pneumothorax: Secondary | ICD-10-CM | POA: Diagnosis not present

## 2024-01-13 DIAGNOSIS — J189 Pneumonia, unspecified organism: Secondary | ICD-10-CM | POA: Diagnosis not present

## 2024-01-13 DIAGNOSIS — J9811 Atelectasis: Secondary | ICD-10-CM | POA: Diagnosis not present

## 2024-01-13 LAB — CBC WITH DIFFERENTIAL/PLATELET
Abs Immature Granulocytes: 0.14 K/uL — ABNORMAL HIGH (ref 0.00–0.07)
Basophils Absolute: 0.1 K/uL (ref 0.0–0.1)
Basophils Relative: 0 %
Eosinophils Absolute: 1 K/uL — ABNORMAL HIGH (ref 0.0–0.5)
Eosinophils Relative: 6 %
HCT: 30.6 % — ABNORMAL LOW (ref 39.0–52.0)
Hemoglobin: 10.6 g/dL — ABNORMAL LOW (ref 13.0–17.0)
Immature Granulocytes: 1 %
Lymphocytes Relative: 9 %
Lymphs Abs: 1.5 K/uL (ref 0.7–4.0)
MCH: 30.9 pg (ref 26.0–34.0)
MCHC: 34.6 g/dL (ref 30.0–36.0)
MCV: 89.2 fL (ref 80.0–100.0)
Monocytes Absolute: 1 K/uL (ref 0.1–1.0)
Monocytes Relative: 6 %
Neutro Abs: 12.6 K/uL — ABNORMAL HIGH (ref 1.7–7.7)
Neutrophils Relative %: 78 %
Platelets: 339 K/uL (ref 150–400)
RBC: 3.43 MIL/uL — ABNORMAL LOW (ref 4.22–5.81)
RDW: 12.9 % (ref 11.5–15.5)
WBC: 16.2 K/uL — ABNORMAL HIGH (ref 4.0–10.5)
nRBC: 0 % (ref 0.0–0.2)

## 2024-01-13 LAB — RENAL FUNCTION PANEL
Albumin: 2.3 g/dL — ABNORMAL LOW (ref 3.5–5.0)
Anion gap: 8 (ref 5–15)
BUN: 18 mg/dL (ref 6–20)
CO2: 25 mmol/L (ref 22–32)
Calcium: 8.4 mg/dL — ABNORMAL LOW (ref 8.9–10.3)
Chloride: 98 mmol/L (ref 98–111)
Creatinine, Ser: 0.78 mg/dL (ref 0.61–1.24)
GFR, Estimated: >60 mL/min (ref >60)
Glucose, Bld: 117 mg/dL — ABNORMAL HIGH (ref 70–99)
Phosphorus: 3.1 mg/dL (ref 2.5–4.6)
Potassium: 4.4 mmol/L (ref 3.5–5.1)
Sodium: 131 mmol/L — ABNORMAL LOW (ref 135–145)

## 2024-01-13 LAB — PROCALCITONIN: Procalcitonin: 1.66 ng/mL

## 2024-01-13 LAB — MAGNESIUM: Magnesium: 1.9 mg/dL (ref 1.7–2.4)

## 2024-01-13 MED ORDER — IOHEXOL 350 MG/ML SOLN
75.0000 mL | Freq: Once | INTRAVENOUS | Status: AC | PRN
  Administered 2024-01-13: 75 mL via INTRAVENOUS

## 2024-01-13 NOTE — Progress Notes (Signed)
 PROGRESS NOTE  Albert Hill FMW:[id number] DOB: [birth year]   PCP: Lorren Greig PARAS, NP  Patient is from: Home.  Independently ambulates at baseline  DOA: 12/29/2023 LOS: 15  Chief complaints Chief Complaint  Patient presents with   Shortness of Breath     Brief Narrative / Interim history: 48 year old man w/ hx of COPD, OSA, HTN who presented with recurrent right pneumothorax. After discussion, underwent VATS w R apical bleb resection, pleural abrasion. Pre-op noted to have significant hypoxemia and WOB so left intubated. Extubated on 7/16, transferred care to [facility] on 7/18. Right sided chest tube now connected to miniexpress.   Subjective: Seen and examined earlier this morning.  No major events overnight or this morning.  Reports feeling better.  Still with some discomfort and pain around chest tube.  Denies shortness of breath or cough.  On room air.  Objective: Vitals:   01/12/24 2301 01/13/24 0422 01/13/24 0750 01/13/24 0907  BP: (!) 102/57 (!) 103/52 105/69   Pulse: (!) 105  94   Resp: 18 18 14    Temp: 100.1 F (37.8 C) 98.9 F (37.2 C) 98.9 F (37.2 C)   TempSrc: Oral Oral Oral   SpO2: 95%  92% (!) 89%  Weight:  73.6 kg    Height:        Examination:  GENERAL: No apparent distress.  Nontoxic. HEENT: MMM.  Vision and hearing grossly intact.  NECK: Supple.  No apparent JVD.  RESP:  No IWOB.  Fair aeration bilaterally.  Chest tube connected to miniexpress CVS:  RRR. Heart sounds normal.  ABD/GI/GU: BS+. Abd soft, NTND.  MSK/EXT:  Moves extremities. No apparent deformity. No edema.  SKIN: no apparent skin lesion or wound NEURO: Sleepy but wakes to voice.  Not quite alert.  Fairly oriented.  No apparent focal neuro deficit. PSYCH: Calm. Normal affect.   Consultants:  Cardiothoracic surgery  Microbiology summarized: None  Assessment and plan: Recurrent Pneumothorax,POA:  In the setting of bullous emphysema, continued tobacco abuse -S/p Right sided  VATS -Extubated on 7/16. -Chest tube connected to miniexpress -CXR on 7/26 showed stable right chest tube without PTX but slightly increased bibasilar airspace disease. -Started on ceftriaxone  and doxycycline  on 7/26. -Appreciate input by cardiothoracic surgery. -Pain control  Bibasilar pneumonia: CXR from this morning raises concern for slightly increased bibasilar infiltrate.  Also question about cellulitis around chest tube.  WBC 17.8 (was 7.0 on 7/21).  Had mild temp to 100.1.  Pro-Cal elevated.  MRSA PCR screen negative. -Continue ceftriaxone  and doxycycline  -CT chest with contrast today. -Continue trending leukocytosis  AKI: Baseline creatinine 0.6-0.7.  Likely due to NSAID and lisinopril .  AKI resolved. Recent Labs    01/01/24 0523 01/02/24 0450 01/03/24 0355 01/04/24 0248 01/05/24 0200 01/06/24 0235 01/07/24 0228 01/08/24 0159 01/12/24 0226 01/13/24 0244  BUN 20 21* 15 18 16 16 15 13  27* 18  CREATININE 0.82 0.71 0.72 0.81 0.74 0.67 0.62 0.68 1.08 0.78  -Continue monitoring  COPD: Not in exacerbation: -Bronchodilators. -Encouraged smoking cessation   Hypertension: Normotensive. -P.o. hydralazine  as needed - Continue holding lisinopril  in the setting of AKI   Tobacco abuse: Advised strongly regarding tobacco cessation.  Leukocytosis: Due to pneumonia/cellulitis -Antibiotics as above -Recheck in the morning   Disposition: Home, IADL  Severe malnutrition Body mass index is 23.28 kg/m. Nutrition Problem: Severe Malnutrition Etiology: acute illness (recurrent pneumothorax) Signs/Symptoms: moderate fat depletion, moderate muscle depletion, severe muscle depletion Interventions: Tube feeding   DVT prophylaxis:  enoxaparin  (  LOVENOX ) injection 40 mg Start: 01/01/24 2200 SCD's Start: 12/31/23 1518  Code Status: Full code Family Communication: None at bedside. Level of care: Progressive Status is: Inpatient Remains inpatient appropriate because: Persistent  pneumothorax, possible pneumonia   Final disposition: Home once medically cleared   55 minutes with more than 50% spent in reviewing records, counseling patient/family and coordinating care.   Sch Meds:  Scheduled Meds:  allopurinol   300 mg Oral Daily   arformoterol   15 mcg Nebulization BID   bisacodyl   10 mg Oral Daily   doxycycline   100 mg Oral Q12H   enoxaparin  (LOVENOX ) injection  40 mg Subcutaneous QHS   gabapentin   300 mg Oral TID   guaiFENesin   600 mg Oral BID   multivitamin with minerals  1 tablet Oral Daily   nicotine   21 mg Transdermal Daily   pantoprazole   40 mg Oral Daily   polyethylene glycol  17 g Oral Daily   revefenacin   175 mcg Nebulization Daily   senna-docusate  1 tablet Oral BID   Continuous Infusions:  cefTRIAXone  (ROCEPHIN )  IV 2 g (01/13/24 0846)   PRN Meds:.hydrALAZINE , HYDROmorphone  (DILAUDID ) injection, ipratropium-albuterol , LORazepam , methocarbamol , naphazoline-glycerin , ondansetron  **OR** ondansetron  (ZOFRAN ) IV, mouth rinse, oxyCODONE , phenol, temazepam , traMADol   Antimicrobials: Anti-infectives (From admission, onward)    Start     Dose/Rate Route Frequency Ordered Stop   01/12/24 0845  cefTRIAXone  (ROCEPHIN ) 2 g in sodium chloride  0.9 % 100 mL IVPB        2 g 200 mL/hr over 30 Minutes Intravenous Every 24 hours 01/12/24 0745     01/12/24 0845  doxycycline  (VIBRA -TABS) tablet 100 mg        100 mg Oral Every 12 hours 01/12/24 0745 01/17/24 0959   01/01/24 1500  piperacillin -tazobactam (ZOSYN ) IVPB 3.375 g        3.375 g 12.5 mL/hr over 240 Minutes Intravenous Every 8 hours 01/01/24 0736 01/06/24 1032   01/01/24 1000  vancomycin  (VANCOREADY) IVPB 1500 mg/300 mL        1,500 mg 150 mL/hr over 120 Minutes Intravenous Every 12 hours 01/01/24 0744 01/05/24 2355   01/01/24 0830  piperacillin -tazobactam (ZOSYN ) IVPB 3.375 g        3.375 g 100 mL/hr over 30 Minutes Intravenous  Once 01/01/24 0736 01/01/24 0823   01/01/24 0830  vancomycin   (VANCOREADY) IVPB 1500 mg/300 mL  Status:  Discontinued        1,500 mg 150 mL/hr over 120 Minutes Intravenous Every 12 hours 01/01/24 0736 01/01/24 0744   12/31/23 1615  ceFAZolin  (ANCEF ) IVPB 2g/100 mL premix        2 g 200 mL/hr over 30 Minutes Intravenous Every 8 hours 12/31/23 1517 12/31/23 2350   12/30/23 1858  ceFAZolin  (ANCEF ) IVPB 2g/100 mL premix        2 g 200 mL/hr over 30 Minutes Intravenous 30 min pre-op 12/30/23 1858 12/31/23 1228        I have personally reviewed the following labs and images: CBC: Recent Labs  Lab 01/07/24 0228 01/08/24 0159 01/12/24 0226 01/13/24 0244  WBC 6.8 7.0 17.8* 16.2*  NEUTROABS  --   --   --  12.6*  HGB 10.7* 10.9* 10.8* 10.6*  HCT 30.3* 31.4* 31.1* 30.6*  MCV 88.9 88.7 88.9 89.2  PLT 272 327 344 339   BMP &GFR Recent Labs  Lab 01/07/24 0228 01/08/24 0159 01/12/24 0226 01/13/24 0244  NA 136 136 130* 131*  K 3.8 3.8 4.0 4.4  CL 100 102  95* 98  CO2 27 26 26 25   GLUCOSE 108* 117* 127* 117*  BUN 15 13 27* 18  CREATININE 0.62 0.68 1.08 0.78  CALCIUM 8.7* 8.8* 8.6* 8.4*  MG  --   --  1.7 1.9  PHOS  --   --  3.6 3.1   Estimated Creatinine Clearance: 115.3 mL/min (by C-G formula based on SCr of 0.78 mg/dL). Liver & Pancreas: Recent Labs  Lab 01/12/24 0226 01/13/24 0244  ALBUMIN  2.3* 2.3*   No results for input(s): LIPASE, AMYLASE in the last 168 hours. No results for input(s): AMMONIA in the last 168 hours. Diabetic: No results for input(s): HGBA1C in the last 72 hours. No results for input(s): GLUCAP in the last 168 hours.  Cardiac Enzymes: No results for input(s): CKTOTAL, CKMB, CKMBINDEX, TROPONINI in the last 168 hours. No results for input(s): PROBNP in the last 8760 hours. Coagulation Profile: No results for input(s): INR, PROTIME in the last 168 hours. Thyroid Function Tests: No results for input(s): TSH, T4TOTAL, FREET4, T3FREE, THYROIDAB in the last 72 hours. Lipid  Profile: No results for input(s): CHOL, HDL, LDLCALC, TRIG, CHOLHDL, LDLDIRECT in the last 72 hours. Anemia Panel: No results for input(s): VITAMINB12, FOLATE, FERRITIN, TIBC, IRON, RETICCTPCT in the last 72 hours. Urine analysis:    Component Value Date/Time   COLORURINE YELLOW (A) 01/04/2022 1207   APPEARANCEUR HAZY (A) 01/04/2022 1207   APPEARANCEUR Clear 01/22/2019 1023   LABSPEC 1.016 01/04/2022 1207   PHURINE 5.0 01/04/2022 1207   GLUCOSEU NEGATIVE 01/04/2022 1207   HGBUR NEGATIVE 01/04/2022 1207   BILIRUBINUR NEGATIVE 01/04/2022 1207   BILIRUBINUR Negative 01/22/2019 1023   KETONESUR NEGATIVE 01/04/2022 1207   PROTEINUR NEGATIVE 01/04/2022 1207   NITRITE NEGATIVE 01/04/2022 1207   LEUKOCYTESUR NEGATIVE 01/04/2022 1207   Sepsis Labs: Invalid input(s): PROCALCITONIN, LACTICIDVEN  Microbiology: Recent Results (from the past 240 hours)  MRSA Next Gen by PCR, Nasal     Status: None   Collection Time: 01/12/24  7:45 AM   Specimen: Nasal Mucosa; Nasal Swab  Result Value Ref Range Status   MRSA by PCR Next Gen NOT DETECTED NOT DETECTED Final    Comment: (NOTE) The GeneXpert MRSA Assay (FDA approved for NASAL specimens only), is one component of a comprehensive MRSA colonization surveillance program. It is not intended to diagnose MRSA infection nor to guide or monitor treatment for MRSA infections. Test performance is not FDA approved in patients less than 77 years old. Performed at [facility], [address], [place], [place] [address]      Radiology Studies: No results found.     Nyia Tsao T. Kolby Myung [facility]  If 7PM-7AM, please contact night-coverage [email or web address] 01/13/2024, 10:23 AM

## 2024-01-13 NOTE — Progress Notes (Addendum)
      [address]       [place] [address]             [phone]       13 Days Post-Op Procedure(s) (LRB): ROBOTIC ASSISTED RIGHT VIDEO ASSISTED THORACOSCOPIC SURGERY, BLEB RESECTION, PLEURAL ABRASION (Right) BLOCK, NERVE, INTERCOSTAL (Right)  Subjective:  His only complaint is pain around the tube site.  Objective: Vital signs in last 24 hours: Temp:  [98.3 F (36.8 C)-100.1 F (37.8 C)] 98.9 F (37.2 C) (07/27 0750) Pulse Rate:  [94-106] 94 (07/27 0750) Cardiac Rhythm: Sinus tachycardia (07/27 0700) Resp:  [14-20] 14 (07/27 0750) BP: (102-113)/(52-74) 103/52 (07/27 0422) SpO2:  [91 %-95 %] 92 % (07/27 0750) Weight:  [73.6 kg] 73.6 kg (07/27 0422)      Intake/Output from previous day: 07/26 0701 - 07/27 0700 In: 460 [P.O.:360; IV Piggyback:100] Out: 200 [Urine:200]   Physical Exam:  Cardiovascular: RRR Pulmonary: Clear to auscultation bilaterally Extremities: No lower extremity edema. Wounds: Clean and dry.  No erythema or signs of infection. Chest Tube: to mini express. No air leak this am. Mild redness around the tube but no drainage.  Lab Results: CBC: Recent Labs    01/12/24 0226 01/13/24 0244  WBC 17.8* 16.2*  HGB 10.8* 10.6*  HCT 31.1* 30.6*  PLT 344 339   BMET:  Recent Labs    01/12/24 0226 01/13/24 0244  NA 130* 131*  K 4.0 4.4  CL 95* 98  CO2 26 25  GLUCOSE 127* 117*  BUN 27* 18  CREATININE 1.08 0.78  CALCIUM 8.6* 8.4*    PT/INR: No results for input(s): LABPROT, INR in the last 72 hours. ABG:  INR: Will add last result for INR, ABG once components are confirmed Will add last 4 CBG results once components are confirmed  Assessment/Plan:  1. CV - ST at times.  2.  Pulmonary - History of COPD. Continue nebs/inhalers. On2-3L via [place]. Chest tube is to mini express. Air leak appears to have stopped. Will keep to Mini-express today and check CXR in am. If no air leak and chest xray ok will plan to remove the  tube tomorrow. Keep in hospital for now. 3. ID-WBC slightly decreased to 16,200. Remains afebrile. On Ceftriaxone  and Doxycycline . This may be due to cellulitis around the tube site. 4. On Lovenox  for DVT prophylaxis 5. Tobacco abuse-nicotine  patch, encouraged cessation 6. Follow up with [facility] arranged  Dorise LOIS Fellers, MD

## 2024-01-13 NOTE — Plan of Care (Signed)
  Problem: Education: Goal: Knowledge of General Education information will improve Description: Including pain rating scale, medication(s)/side effects and non-pharmacologic comfort measures Outcome: Progressing   Problem: Health Behavior/Discharge Planning: Goal: Ability to manage health-related needs will improve Outcome: Progressing   Problem: Clinical Measurements: Goal: Ability to maintain clinical measurements within normal limits will improve Outcome: Progressing Goal: Will remain free from infection Outcome: Progressing Goal: Diagnostic test results will improve Outcome: Progressing Goal: Respiratory complications will improve Outcome: Progressing Goal: Cardiovascular complication will be avoided Outcome: Progressing   Problem: Activity: Goal: Risk for activity intolerance will decrease Outcome: Progressing   Problem: Nutrition: Goal: Adequate nutrition will be maintained Outcome: Progressing   Problem: Coping: Goal: Level of anxiety will decrease Outcome: Progressing   Problem: Elimination: Goal: Will not experience complications related to bowel motility Outcome: Progressing Goal: Will not experience complications related to urinary retention Outcome: Progressing   Problem: Pain Managment: Goal: General experience of comfort will improve and/or be controlled Outcome: Progressing   Problem: Safety: Goal: Ability to remain free from injury will improve Outcome: Progressing   Problem: Skin Integrity: Goal: Risk for impaired skin integrity will decrease Outcome: Progressing   Problem: Education: Goal: Knowledge of disease or condition will improve Outcome: Progressing Goal: Knowledge of the prescribed therapeutic regimen will improve Outcome: Progressing   Problem: Activity: Goal: Risk for activity intolerance will decrease Outcome: Progressing   Problem: Cardiac: Goal: Will achieve and/or maintain hemodynamic stability Outcome: Progressing    Problem: Clinical Measurements: Goal: Postoperative complications will be avoided or minimized Outcome: Progressing   Problem: Respiratory: Goal: Respiratory status will improve Outcome: Progressing   Problem: Pain Management: Goal: Pain level will decrease Outcome: Progressing   Problem: Skin Integrity: Goal: Wound healing without signs and symptoms infection will improve Outcome: Progressing   Problem: Safety: Goal: Non-violent Restraint(s) Outcome: Progressing

## 2024-01-14 ENCOUNTER — Inpatient Hospital Stay (HOSPITAL_COMMUNITY)

## 2024-01-14 DIAGNOSIS — J9383 Other pneumothorax: Secondary | ICD-10-CM | POA: Diagnosis not present

## 2024-01-14 DIAGNOSIS — J948 Other specified pleural conditions: Secondary | ICD-10-CM | POA: Diagnosis not present

## 2024-01-14 DIAGNOSIS — J439 Emphysema, unspecified: Secondary | ICD-10-CM | POA: Diagnosis not present

## 2024-01-14 DIAGNOSIS — J181 Lobar pneumonia, unspecified organism: Secondary | ICD-10-CM | POA: Diagnosis not present

## 2024-01-14 DIAGNOSIS — Z4682 Encounter for fitting and adjustment of non-vascular catheter: Secondary | ICD-10-CM | POA: Diagnosis not present

## 2024-01-14 DIAGNOSIS — E43 Unspecified severe protein-calorie malnutrition: Secondary | ICD-10-CM | POA: Diagnosis not present

## 2024-01-14 DIAGNOSIS — J984 Other disorders of lung: Secondary | ICD-10-CM | POA: Diagnosis not present

## 2024-01-14 DIAGNOSIS — J9811 Atelectasis: Secondary | ICD-10-CM | POA: Diagnosis not present

## 2024-01-14 DIAGNOSIS — J939 Pneumothorax, unspecified: Secondary | ICD-10-CM | POA: Diagnosis not present

## 2024-01-14 DIAGNOSIS — J90 Pleural effusion, not elsewhere classified: Secondary | ICD-10-CM | POA: Diagnosis not present

## 2024-01-14 LAB — CBC
HCT: 27.4 % — ABNORMAL LOW (ref 39.0–52.0)
Hemoglobin: 9.5 g/dL — ABNORMAL LOW (ref 13.0–17.0)
MCH: 30.9 pg (ref 26.0–34.0)
MCHC: 34.7 g/dL (ref 30.0–36.0)
MCV: 89.3 fL (ref 80.0–100.0)
Platelets: 320 K/uL (ref 150–400)
RBC: 3.07 MIL/uL — ABNORMAL LOW (ref 4.22–5.81)
RDW: 12.8 % (ref 11.5–15.5)
WBC: 11.6 K/uL — ABNORMAL HIGH (ref 4.0–10.5)
nRBC: 0 % (ref 0.0–0.2)

## 2024-01-14 LAB — RENAL FUNCTION PANEL
Albumin: 2.2 g/dL — ABNORMAL LOW (ref 3.5–5.0)
Anion gap: 9 (ref 5–15)
BUN: 16 mg/dL (ref 6–20)
CO2: 25 mmol/L (ref 22–32)
Calcium: 8.3 mg/dL — ABNORMAL LOW (ref 8.9–10.3)
Chloride: 97 mmol/L — ABNORMAL LOW (ref 98–111)
Creatinine, Ser: 0.84 mg/dL (ref 0.61–1.24)
GFR, Estimated: >60 mL/min (ref >60)
Glucose, Bld: 130 mg/dL — ABNORMAL HIGH (ref 70–99)
Phosphorus: 3.4 mg/dL (ref 2.5–4.6)
Potassium: 4.1 mmol/L (ref 3.5–5.1)
Sodium: 131 mmol/L — ABNORMAL LOW (ref 135–145)

## 2024-01-14 LAB — PROCALCITONIN: Procalcitonin: 1 ng/mL

## 2024-01-14 LAB — MAGNESIUM: Magnesium: 1.8 mg/dL (ref 1.7–2.4)

## 2024-01-14 MED ORDER — AMOXICILLIN-POT CLAVULANATE 875-125 MG PO TABS
1.0000 | ORAL_TABLET | Freq: Two times a day (BID) | ORAL | Status: DC
  Administered 2024-01-15: 1 via ORAL
  Filled 2024-01-14: qty 1

## 2024-01-14 NOTE — Progress Notes (Signed)
 PROGRESS NOTE  Albert Hill FMW:[id number] DOB: 06/28/1975   PCP: Lorren Greig PARAS, NP  Patient is from: Home.  Independently ambulates at baseline  DOA: 12/29/2023 LOS: 16  Chief complaints Chief Complaint  Patient presents with   Shortness of Breath     Brief Narrative / Interim history: 48 year old man w/ hx of COPD, OSA, HTN who presented with recurrent right pneumothorax. After discussion, underwent VATS w R apical bleb resection, pleural abrasion. Pre-op noted to have significant hypoxemia and WOB so left intubated. Extubated on 7/16, transferred care to [facility] on 7/18. Right sided chest tube now connected to miniexpress.  Patient had elevated leukocytosis on 7/25.  Pro-Cal elevated.  Imaging raise concern for RLL infiltrate.  Started on ceftriaxone  and doxycycline .  CT chest on 7/27 showed small multiloculated right hydropneumothorax and RLL pneumonia.  Subjective: Seen and examined earlier this morning.  No major events overnight or this morning.  No complaints other than some soreness at the chest tube site.  Denies shortness of breath.  Objective: Vitals:   01/14/24 0442 01/14/24 0500 01/14/24 0737 01/14/24 0807  BP: (!) 100/58  131/89   Pulse: 88   96  Resp: 18  20 18   Temp: 98.6 F (37 C)  98.7 F (37.1 C)   TempSrc: Oral  Oral   SpO2: 92%  97%   Weight:  73.5 kg    Height:        Examination:  GENERAL: No apparent distress.  Nontoxic. HEENT: MMM.  Vision and hearing grossly intact.  NECK: Supple.  No apparent JVD.  RESP:  No IWOB.  Fair aeration bilaterally.  Chest tube connected to miniexpress CVS:  RRR. Heart sounds normal.  ABD/GI/GU: BS+. Abd soft, NTND.  MSK/EXT:  Moves extremities. No apparent deformity. No edema.  SKIN: no apparent skin lesion or wound NEURO: Sleepy but wakes to voice.  Not quite alert.  Fairly oriented.  No apparent focal neuro deficit. PSYCH: Calm. Normal affect.   Consultants:  Cardiothoracic surgery  Microbiology  summarized: None  Assessment and plan: Recurrent Pneumothorax,POA:  In the setting of bullous emphysema, continued tobacco abuse -S/p Right sided VATS -Extubated on 7/16. -Chest tube connected to miniexpress.  CTS managing. -CT chest on 7/27 with small multiloculated right hydropneumothorax and RLL infiltrate. -Ceftriaxone  and doxycycline  on 7/26>> -Will have input from ID about antibiotic choice and duration given CT finding -Pain control  Bibasilar pneumonia/right hydropneumothorax: CXR on 7/26 with concern for bibasilar infiltrate.  WBC 17.8 on 7/26  (was 7.0 on 7/21).  Pro-Cal elevated to 1.8.  MRSA PCR screen negative.  Leukocytosis and procal improved after antibiotics.  CT chest on 7/27 with small multiloculated right hydropneumothorax and RLL infiltrate. -Continue ceftriaxone  and doxycycline .  Curbside ID about antibiotic choice and duration. -Continue trending leukocytosis  AKI: Baseline creatinine 0.6-0.7.  Likely due to NSAID and lisinopril .  AKI resolved. Recent Labs    01/02/24 0450 01/03/24 0355 01/04/24 0248 01/05/24 0200 01/06/24 0235 01/07/24 0228 01/08/24 0159 01/12/24 0226 01/13/24 0244 01/14/24 0219  BUN 21* 15 18 16 16 15 13  27* 18 16  CREATININE 0.71 0.72 0.81 0.74 0.67 0.62 0.68 1.08 0.78 0.84  -Continue monitoring  COPD: Not in exacerbation: -Bronchodilators. -Encouraged smoking cessation   Hypertension: Normotensive. -P.o. hydralazine  as needed - Continue holding lisinopril  in the setting of AKI   Tobacco abuse: Advised strongly regarding tobacco cessation.  Leukocytosis: Due to pneumonia/cellulitis.  Improved. -Antibiotics as above -Recheck in the morning  Disposition: Home, IADL  Severe malnutrition Body mass index is 23.25 kg/m. Nutrition Problem: Severe Malnutrition Etiology: acute illness (recurrent pneumothorax) Signs/Symptoms: moderate fat depletion, moderate muscle depletion, severe muscle depletion Interventions: Tube  feeding   DVT prophylaxis:  enoxaparin  (LOVENOX ) injection 40 mg Start: 01/01/24 2200 SCD's Start: 12/31/23 1518  Code Status: Full code Family Communication: None at bedside. Level of care: Progressive Status is: Inpatient Remains inpatient appropriate because: Persistent pneumothorax, possible pneumonia   Final disposition: Home once medically cleared   55 minutes with more than 50% spent in reviewing records, counseling patient/family and coordinating care.   Sch Meds:  Scheduled Meds:  allopurinol   300 mg Oral Daily   arformoterol   15 mcg Nebulization BID   bisacodyl   10 mg Oral Daily   doxycycline   100 mg Oral Q12H   enoxaparin  (LOVENOX ) injection  40 mg Subcutaneous QHS   gabapentin   300 mg Oral TID   guaiFENesin   600 mg Oral BID   multivitamin with minerals  1 tablet Oral Daily   nicotine   21 mg Transdermal Daily   pantoprazole   40 mg Oral Daily   polyethylene glycol  17 g Oral Daily   revefenacin   175 mcg Nebulization Daily   senna-docusate  1 tablet Oral BID   Continuous Infusions:  cefTRIAXone  (ROCEPHIN )  IV 2 g (01/14/24 0808)   PRN Meds:.hydrALAZINE , HYDROmorphone  (DILAUDID ) injection, ipratropium-albuterol , LORazepam , methocarbamol , naphazoline-glycerin , ondansetron  **OR** ondansetron  (ZOFRAN ) IV, mouth rinse, oxyCODONE , phenol, temazepam , traMADol   Antimicrobials: Anti-infectives (From admission, onward)    Start     Dose/Rate Route Frequency Ordered Stop   01/12/24 0845  cefTRIAXone  (ROCEPHIN ) 2 g in sodium chloride  0.9 % 100 mL IVPB        2 g 200 mL/hr over 30 Minutes Intravenous Every 24 hours 01/12/24 0745     01/12/24 0845  doxycycline  (VIBRA -TABS) tablet 100 mg        100 mg Oral Every 12 hours 01/12/24 0745 01/17/24 0959   01/01/24 1500  piperacillin -tazobactam (ZOSYN ) IVPB 3.375 g        3.375 g 12.5 mL/hr over 240 Minutes Intravenous Every 8 hours 01/01/24 0736 01/06/24 1032   01/01/24 1000  vancomycin  (VANCOREADY) IVPB 1500 mg/300 mL         1,500 mg 150 mL/hr over 120 Minutes Intravenous Every 12 hours 01/01/24 0744 01/05/24 2355   01/01/24 0830  piperacillin -tazobactam (ZOSYN ) IVPB 3.375 g        3.375 g 100 mL/hr over 30 Minutes Intravenous  Once 01/01/24 0736 01/01/24 0823   01/01/24 0830  vancomycin  (VANCOREADY) IVPB 1500 mg/300 mL  Status:  Discontinued        1,500 mg 150 mL/hr over 120 Minutes Intravenous Every 12 hours 01/01/24 0736 01/01/24 0744   12/31/23 1615  ceFAZolin  (ANCEF ) IVPB 2g/100 mL premix        2 g 200 mL/hr over 30 Minutes Intravenous Every 8 hours 12/31/23 1517 12/31/23 2350   12/30/23 1858  ceFAZolin  (ANCEF ) IVPB 2g/100 mL premix        2 g 200 mL/hr over 30 Minutes Intravenous 30 min pre-op 12/30/23 1858 12/31/23 1228        I have personally reviewed the following labs and images: CBC: Recent Labs  Lab 01/08/24 0159 01/12/24 0226 01/13/24 0244 01/14/24 0219  WBC 7.0 17.8* 16.2* 11.6*  NEUTROABS  --   --  12.6*  --   HGB 10.9* 10.8* 10.6* 9.5*  HCT 31.4* 31.1* 30.6* 27.4*  MCV 88.7 88.9  89.2 89.3  PLT 327 344 339 320   BMP &GFR Recent Labs  Lab 01/08/24 0159 01/12/24 0226 01/13/24 0244 01/14/24 0219  NA 136 130* 131* 131*  K 3.8 4.0 4.4 4.1  CL 102 95* 98 97*  CO2 26 26 25 25   GLUCOSE 117* 127* 117* 130*  BUN 13 27* 18 16  CREATININE 0.68 1.08 0.78 0.84  CALCIUM 8.8* 8.6* 8.4* 8.3*  MG  --  1.7 1.9 1.8  PHOS  --  3.6 3.1 3.4   Estimated Creatinine Clearance: 109.8 mL/min (by C-G formula based on SCr of 0.84 mg/dL). Liver & Pancreas: Recent Labs  Lab 01/12/24 0226 01/13/24 0244 01/14/24 0219  ALBUMIN  2.3* 2.3* 2.2*   No results for input(s): LIPASE, AMYLASE in the last 168 hours. No results for input(s): AMMONIA in the last 168 hours. Diabetic: No results for input(s): HGBA1C in the last 72 hours. No results for input(s): GLUCAP in the last 168 hours.  Cardiac Enzymes: No results for input(s): CKTOTAL, CKMB, CKMBINDEX, TROPONINI in the  last 168 hours. No results for input(s): PROBNP in the last 8760 hours. Coagulation Profile: No results for input(s): INR, PROTIME in the last 168 hours. Thyroid Function Tests: No results for input(s): TSH, T4TOTAL, FREET4, T3FREE, THYROIDAB in the last 72 hours. Lipid Profile: No results for input(s): CHOL, HDL, LDLCALC, TRIG, CHOLHDL, LDLDIRECT in the last 72 hours. Anemia Panel: No results for input(s): VITAMINB12, FOLATE, FERRITIN, TIBC, IRON, RETICCTPCT in the last 72 hours. Urine analysis:    Component Value Date/Time   COLORURINE YELLOW (A) 01/04/2022 1207   APPEARANCEUR HAZY (A) 01/04/2022 1207   APPEARANCEUR Clear 01/22/2019 1023   LABSPEC 1.016 01/04/2022 1207   PHURINE 5.0 01/04/2022 1207   GLUCOSEU NEGATIVE 01/04/2022 1207   HGBUR NEGATIVE 01/04/2022 1207   BILIRUBINUR NEGATIVE 01/04/2022 1207   BILIRUBINUR Negative 01/22/2019 1023   KETONESUR NEGATIVE 01/04/2022 1207   PROTEINUR NEGATIVE 01/04/2022 1207   NITRITE NEGATIVE 01/04/2022 1207   LEUKOCYTESUR NEGATIVE 01/04/2022 1207   Sepsis Labs: Invalid input(s): PROCALCITONIN, LACTICIDVEN  Microbiology: Recent Results (from the past 240 hours)  MRSA Next Gen by PCR, Nasal     Status: None   Collection Time: 01/12/24  7:45 AM   Specimen: Nasal Mucosa; Nasal Swab  Result Value Ref Range Status   MRSA by PCR Next Gen NOT DETECTED NOT DETECTED Final    Comment: (NOTE) The GeneXpert MRSA Assay (FDA approved for NASAL specimens only), is one component of a comprehensive MRSA colonization surveillance program. It is not intended to diagnose MRSA infection nor to guide or monitor treatment for MRSA infections. Test performance is not FDA approved in patients less than 82 years old. Performed at [facility], [address], [place], [place] [address]      Radiology Studies: DG CHEST PORT 1 VIEW Result Date: 01/14/2024 CLINICAL DATA:  [id number].  Pneumothorax  follow-up. EXAM: PORTABLE CHEST 1 VIEW COMPARISON:  Chest CT yesterday at 11:12 a.m. FINDINGS: 5:26 a.m. Right chest tube with tip in the apex medially is unchanged. A small overlying loculated hydropneumothorax is also similar and again overlies surgical sutures. There is continued patchy airspace disease in right lower lung field and a small layering pleural effusion component. The lungs are emphysematous and otherwise clear. The left sulci are sharp. The cardiomediastinal silhouette is normal.  No new osseous finding. IMPRESSION: 1. Unchanged small loculated hydropneumothorax overlying surgical changes in the apex. 2. Unchanged patchy airspace disease in the right lower  lung field and small layering pleural effusion component. 3. Emphysema. Electronically Signed   By: Francis Quam M.D.   On: 01/14/2024 07:43   CT CHEST W CONTRAST Result Date: 01/13/2024 CLINICAL DATA:  Pneumonia.  Hydropneumothorax. EXAM: CT CHEST WITH CONTRAST TECHNIQUE: Multidetector CT imaging of the chest was performed during intravenous contrast administration. RADIATION DOSE REDUCTION: This exam was performed according to the departmental dose-optimization program which includes automated exposure control, adjustment of the mA and/or kV according to patient size and/or use of iterative reconstruction technique. CONTRAST:  75mL OMNIPAQUE  IOHEXOL  350 MG/ML SOLN COMPARISON:  12/31/2023 FINDINGS: Cardiovascular:  No acute findings. Mediastinum/Nodes: Shotty sub-cm mediastinal lymph nodes seen. Mild right hilar lymphadenopathy, without significant change compared to previous noncontrast CT. Lungs/Pleura: Right chest tube is seen in place. Small right hydropneumothorax is seen with a loculated component in the right apex and along the major fissure. Bilateral lower lobe and right middle lobe atelectasis has increased since previous study. New airspace opacity also seen in the lateral right lower lobe. Moderate emphysema again seen. Upper  Abdomen:  Unremarkable. Musculoskeletal:  No suspicious bone lesions. IMPRESSION: Right chest tube in place, with small multiloculated right hydropneumothorax. Increased bilateral lower lobe and right middle lobe atelectasis. New airspace opacity in lateral right lower lobe, possibly due to pneumonia. Mild right hilar lymphadenopathy, likely reactive in etiology. Continued attention recommended on follow-up imaging. Emphysema (ICD10-J43.9). Electronically Signed   By: Norleen DELENA Kil M.D.   On: 01/13/2024 15:21       Margurite Duffy T. Roshaunda Starkey [facility]  If 7PM-7AM, please contact night-coverage [email or web address] 01/14/2024, 9:34 AM

## 2024-01-14 NOTE — Progress Notes (Signed)
 Removed Right side chest tube, patient tolerated it well. Noted green drainage at insertion site, cleaned and placed new gauze dressing.

## 2024-01-14 NOTE — Consult Note (Signed)
 [facility]    Date of Admission:  12/29/2023     Reason for Consult: pneumonia    Referring Provider: Kathrin     Lines:  Peripheral iv's  Abx: 7/26-c doxy/ceftriaxone   7/15-7/20 vanc/piptazo    Assessment: 48 y.o. male smoker, copd, spontaneous ptx failed conservative tx admitted again 7/12 and underwent robotic assisted VATS 7/14, course with pneumonia   Mild leukocytosis treated as pna with improved leukocytosis. Loculated effusion not uncommon after vats and stable over the last few days  Reasonable to do 2 weeks of abx from last treatment date 01/11/24  Mrsa nares negative  Plan: Transition abx to augmentin ; stop doxy Finish 2 week course 7/26 to 8/08 Maintain standard isolation precaution Chest tube management per CTS Ok to discharge when cleared by CTS No need for ID clinic f/u Discussed with primary team     ------------------------------------------------ Principal Problem:   Recurrent spontaneous pneumothorax Active Problems:   Protein-calorie malnutrition, severe    HPI: Albert Hill is a 48 y.o. male smoker, copd, spontaneous ptx failed conservative tx admitted again 7/12 and underwent robotic assisted VATS 7/14, course with pneumonia  Patient has had low grade fever intermittently post operative Serial xray postop showed stable loculated rll effusion and mild ggo in rll His chest tube potentially to be removed today Has some minor insertion site pain but otherwise breathing well and no respiratory sx   He has some leukocytosis max 18 on 7/25 trending down with piptazo/vanc --> doxy/ceftriaxone . Abx started on 7/15  No diarrhea/rash  Family History  Problem Relation Age of Onset   Lung cancer Father    Asthma Son    Colon cancer Neg Hx    Esophageal cancer Neg Hx    Rectal cancer Neg Hx    Stomach cancer Neg Hx     Social History   Tobacco Use   Smoking status: Every Day    Current  packs/day: 2.00    Average packs/day: 2.0 packs/day for 30.0 years (60.0 ttl pk-yrs)    Types: Cigarettes    Passive exposure: Current   Smokeless tobacco: Current    Types: Chew   Tobacco comments:    Smokes a ppd.  Wants to quit.  08/10/2021.  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    No Known Allergies  Review of Systems: ROS All Other ROS was negative, except mentioned above   Past Medical History:  Diagnosis Date   Asthma    COPD (chronic obstructive pulmonary disease) (HCC)    GERD (gastroesophageal reflux disease)    Gout    Hypertension    Sleep apnea    Umbilical hernia without obstruction and without gangrene        Scheduled Meds:  allopurinol   300 mg Oral Daily   [START ON 01/15/2024] amoxicillin -clavulanate  1 tablet Oral Q12H   arformoterol   15 mcg Nebulization BID   bisacodyl   10 mg Oral Daily   enoxaparin  (LOVENOX ) injection  40 mg Subcutaneous QHS   gabapentin   300 mg Oral TID   guaiFENesin   600 mg Oral BID   multivitamin with minerals  1 tablet Oral Daily   nicotine   21 mg  Transdermal Daily   pantoprazole   40 mg Oral Daily   polyethylene glycol  17 g Oral Daily   revefenacin   175 mcg Nebulization Daily   senna-docusate  1 tablet Oral BID   Continuous Infusions: PRN Meds:.hydrALAZINE , HYDROmorphone  (DILAUDID ) injection, ipratropium-albuterol , LORazepam , methocarbamol , naphazoline-glycerin , ondansetron  **OR** ondansetron  (ZOFRAN ) IV, mouth rinse, oxyCODONE , phenol, temazepam , traMADol    OBJECTIVE: Blood pressure (!) 96/49, pulse 89, temperature 98.7 F (37.1 C), temperature source Oral, resp. rate 19, height 5' 10 (1.778 m), weight 73.5 kg, SpO2 92%.  Physical Exam General/constitutional: no distress, pleasant HEENT: Normocephalic, PER, Conj Clear, EOMI,  Oropharynx clear Neck supple CV: rrr no mrg Lungs: clear to auscultation, normal respiratory effort --- right sided chest tube site no purulence; mild tenderness Abd: Soft, Nontender Ext: no edema Skin: No Rash Neuro: nonfocal MSK: no peripheral joint swelling/tenderness/warmth; back spines nontender   Chest tube -- to water seal   Lab Results Lab Results  Component Value Date   WBC 11.6 (H) 01/14/2024   HGB 9.5 (L) 01/14/2024   HCT 27.4 (L) 01/14/2024   MCV 89.3 01/14/2024   PLT 320 01/14/2024    Lab Results  Component Value Date   CREATININE 0.84 01/14/2024   BUN 16 01/14/2024   NA 131 (L) 01/14/2024   K 4.1 01/14/2024   CL 97 (L) 01/14/2024   CO2 25 01/14/2024    Lab Results  Component Value Date   ALT 34 01/02/2024   AST 45 (H) 01/02/2024   ALKPHOS 78 01/02/2024   BILITOT 0.7 01/02/2024      Microbiology: Recent Results (from the past 240 hours)  MRSA Next Gen by PCR, Nasal     Status: None   Collection Time: 01/12/24  7:45 AM   Specimen: Nasal Mucosa; Nasal Swab  Result Value Ref Range Status   MRSA by PCR Next Gen NOT DETECTED NOT DETECTED Final    Comment: (NOTE) The GeneXpert MRSA Assay (FDA approved for NASAL specimens only), is one component of a comprehensive MRSA colonization surveillance program. It is not intended to diagnose MRSA infection nor to guide or monitor treatment for MRSA infections. Test performance is not FDA approved in patients less than 67 years old. Performed at [facility], [address] [place], [place] [address]      Serology:    Imaging: If present, new imagings (plain films, ct scans, and mri) have been personally visualized and interpreted; radiology reports have been reviewed. Decision making incorporated into the Impression / Recommendations.  7/28 cxr 1. Unchanged small loculated hydropneumothorax overlying surgical changes in the apex. 2. Unchanged patchy airspace disease in the right lower lung  field and small layering pleural effusion component. 3. Emphysema.    Constance ONEIDA Passer, MD [facility] [phone] pager    01/14/2024, 1:49 PM

## 2024-01-14 NOTE — Plan of Care (Signed)
  Problem: Education: Goal: Knowledge of General Education information will improve Description: Including pain rating scale, medication(s)/side effects and non-pharmacologic comfort measures Outcome: Progressing   Problem: Health Behavior/Discharge Planning: Goal: Ability to manage health-related needs will improve Outcome: Progressing   Problem: Clinical Measurements: Goal: Ability to maintain clinical measurements within normal limits will improve Outcome: Progressing Goal: Will remain free from infection Outcome: Progressing Goal: Diagnostic test results will improve Outcome: Progressing Goal: Respiratory complications will improve Outcome: Progressing Goal: Cardiovascular complication will be avoided Outcome: Progressing   Problem: Activity: Goal: Risk for activity intolerance will decrease Outcome: Progressing   Problem: Nutrition: Goal: Adequate nutrition will be maintained Outcome: Progressing   Problem: Elimination: Goal: Will not experience complications related to bowel motility Outcome: Progressing Goal: Will not experience complications related to urinary retention Outcome: Progressing   Problem: Coping: Goal: Level of anxiety will decrease Outcome: Progressing   Problem: Skin Integrity: Goal: Risk for impaired skin integrity will decrease Outcome: Progressing   Problem: Education: Goal: Knowledge of disease or condition will improve Outcome: Progressing Goal: Knowledge of the prescribed therapeutic regimen will improve Outcome: Progressing   Problem: Pain Management: Goal: Pain level will decrease Outcome: Progressing   Problem: Skin Integrity: Goal: Wound healing without signs and symptoms infection will improve Outcome: Progressing

## 2024-01-14 NOTE — Progress Notes (Addendum)
      [address] [place] [place] [address]             [phone]      14 Days Post-Op Procedure(s) (LRB): ROBOTIC ASSISTED RIGHT VIDEO ASSISTED THORACOSCOPIC SURGERY, BLEB RESECTION, PLEURAL ABRASION (Right) BLOCK, NERVE, INTERCOSTAL (Right) Subjective: Patient reports he is a little tight around the chest tube but it feels better than yesterday.   Objective: Vital signs in last 24 hours: Temp:  [97.8 F (36.6 C)-98.9 F (37.2 C)] 98.6 F (37 C) (07/28 0442) Pulse Rate:  [82-103] 88 (07/28 0442) Cardiac Rhythm: Normal sinus rhythm (07/28 0420) Resp:  [14-20] 18 (07/28 0442) BP: (98-119)/(58-77) 100/58 (07/28 0442) SpO2:  [88 %-92 %] 92 % (07/28 0442) FiO2 (%):  [21 %] 21 % (07/27 1930) Weight:  [73.5 kg] 73.5 kg (07/28 0500)  Hemodynamic parameters for last 24 hours:    Intake/Output from previous day: 07/27 0701 - 07/28 0700 In: 360 [P.O.:360] Out: 10 [Chest Tube:10] Intake/Output this shift: No intake/output data recorded.  General appearance: alert, cooperative, and no distress Neurologic: intact Heart: sinus tachycardia, no murmur Lungs: slightly diminished bibasilar breath sounds Wound: Slight erythema with serous drainage around the chest tube  Lab Results: Recent Labs    01/13/24 0244 01/14/24 0219  WBC 16.2* 11.6*  HGB 10.6* 9.5*  HCT 30.6* 27.4*  PLT 339 320   BMET:  Recent Labs    01/13/24 0244 01/14/24 0219  NA 131* 131*  K 4.4 4.1  CL 98 97*  CO2 25 25  GLUCOSE 117* 130*  BUN 18 16  CREATININE 0.78 0.84  CALCIUM 8.4* 8.3*    PT/INR: No results for input(s): LABPROT, INR in the last 72 hours. ABG    Component Value Date/Time   PHART 7.447 01/02/2024 0601   HCO3 24.9 01/02/2024 0601   TCO2 26 01/02/2024 0601   ACIDBASEDEF 1.0 12/31/2023 1540   O2SAT 97 01/02/2024 0601   CBG (last 3)  No results for input(s): GLUCAP in the last 72 hours.  Assessment/Plan: S/P Procedure(s) (LRB): ROBOTIC ASSISTED  RIGHT VIDEO ASSISTED THORACOSCOPIC SURGERY, BLEB RESECTION, PLEURAL ABRASION (Right) BLOCK, NERVE, INTERCOSTAL (Right)  CV: ST, VS stable  Pulm: CT to [facility] express. No air leak seen this AM. CT with 20cc serous drainage over 24hrs recorded. CXR without clear pneumothorax, bibasilar opacities/atelectasis are improved but worsening right apical opacity. Chest CT yesterday with small multiloculated right hydropneumothorax, increased bilateral lower lobe and right right middle lobe atelectasis and new airspace opacity in lateral right lower lobe possibly due to pneumonia as well as mild right hilar lymphadenopathy likely reactive. Saturating well on room air this AM. Encourage IS and ambulation. Continue nebs for COPD and have discussed smoking cessation.   ID: On Ceftriaxone  and Doxycycline  for bibasilar infiltrates on CXR and possible cellulitis around chest tube with leukocytosis per primary team. Slight erythema and serous drainage around chest tube. WBC down to 11.6 from 16.2. Afebrile. Continue to closely monitor.   Dispo: No air leak seen this AM and CXR looks overall stable, will discuss chest tube removal with surgeon.    LOS: 16 days    Con GORMAN Bend, PA-C 01/14/2024  Agree Clamping trial today  Linnie MALVA Rayas

## 2024-01-15 ENCOUNTER — Other Ambulatory Visit (HOSPITAL_COMMUNITY): Payer: Self-pay

## 2024-01-15 ENCOUNTER — Inpatient Hospital Stay (HOSPITAL_COMMUNITY)

## 2024-01-15 DIAGNOSIS — R918 Other nonspecific abnormal finding of lung field: Secondary | ICD-10-CM | POA: Diagnosis not present

## 2024-01-15 DIAGNOSIS — J948 Other specified pleural conditions: Secondary | ICD-10-CM | POA: Diagnosis not present

## 2024-01-15 DIAGNOSIS — J439 Emphysema, unspecified: Secondary | ICD-10-CM | POA: Diagnosis not present

## 2024-01-15 DIAGNOSIS — E43 Unspecified severe protein-calorie malnutrition: Secondary | ICD-10-CM | POA: Diagnosis not present

## 2024-01-15 DIAGNOSIS — J939 Pneumothorax, unspecified: Secondary | ICD-10-CM | POA: Diagnosis not present

## 2024-01-15 DIAGNOSIS — J9383 Other pneumothorax: Secondary | ICD-10-CM | POA: Diagnosis not present

## 2024-01-15 LAB — CBC
HCT: 29.7 % — ABNORMAL LOW (ref 39.0–52.0)
Hemoglobin: 10.3 g/dL — ABNORMAL LOW (ref 13.0–17.0)
MCH: 30.8 pg (ref 26.0–34.0)
MCHC: 34.7 g/dL (ref 30.0–36.0)
MCV: 88.9 fL (ref 80.0–100.0)
Platelets: 408 K/uL — ABNORMAL HIGH (ref 150–400)
RBC: 3.34 MIL/uL — ABNORMAL LOW (ref 4.22–5.81)
RDW: 12.7 % (ref 11.5–15.5)
WBC: 10 K/uL (ref 4.0–10.5)
nRBC: 0 % (ref 0.0–0.2)

## 2024-01-15 LAB — RENAL FUNCTION PANEL
Albumin: 2.2 g/dL — ABNORMAL LOW (ref 3.5–5.0)
Anion gap: 13 (ref 5–15)
BUN: 12 mg/dL (ref 6–20)
CO2: 25 mmol/L (ref 22–32)
Calcium: 8.5 mg/dL — ABNORMAL LOW (ref 8.9–10.3)
Chloride: 96 mmol/L — ABNORMAL LOW (ref 98–111)
Creatinine, Ser: 0.7 mg/dL (ref 0.61–1.24)
GFR, Estimated: >60 mL/min (ref >60)
Glucose, Bld: 124 mg/dL — ABNORMAL HIGH (ref 70–99)
Phosphorus: 3.5 mg/dL (ref 2.5–4.6)
Potassium: 4.4 mmol/L (ref 3.5–5.1)
Sodium: 134 mmol/L — ABNORMAL LOW (ref 135–145)

## 2024-01-15 LAB — PROCALCITONIN: Procalcitonin: 0.58 ng/mL

## 2024-01-15 LAB — MAGNESIUM: Magnesium: 1.6 mg/dL — ABNORMAL LOW (ref 1.7–2.4)

## 2024-01-15 MED ORDER — NICOTINE 21 MG/24HR TD PT24: 21.0000 mg | MEDICATED_PATCH | Freq: Every day | TRANSDERMAL | Status: AC

## 2024-01-15 MED ORDER — MAGNESIUM SULFATE 2 GM/50ML IV SOLN
2.0000 g | Freq: Once | INTRAVENOUS | Status: AC
  Administered 2024-01-15: 2 g via INTRAVENOUS
  Filled 2024-01-15: qty 50

## 2024-01-15 MED ORDER — UMECLIDINIUM-VILANTEROL 62.5-25 MCG/ACT IN AEPB
1.0000 | INHALATION_SPRAY | Freq: Every day | RESPIRATORY_TRACT | 0 refills | Status: AC
  Filled 2024-01-15: qty 60, 30d supply, fill #0

## 2024-01-15 MED ORDER — AMOXICILLIN-POT CLAVULANATE 875-125 MG PO TABS
1.0000 | ORAL_TABLET | Freq: Two times a day (BID) | ORAL | 0 refills | Status: AC
  Filled 2024-01-15: qty 26, 13d supply, fill #0

## 2024-01-15 MED ORDER — SENNOSIDES-DOCUSATE SODIUM 8.6-50 MG PO TABS: 1.0000 | ORAL_TABLET | Freq: Two times a day (BID) | ORAL | Status: AC | PRN

## 2024-01-15 MED ORDER — OXYCODONE HCL 5 MG PO TABS
5.0000 mg | ORAL_TABLET | Freq: Three times a day (TID) | ORAL | 0 refills | Status: AC | PRN
  Filled 2024-01-15: qty 15, 5d supply, fill #0

## 2024-01-15 NOTE — Discharge Summary (Signed)
 Physician Discharge Summary  Albert Hill FMW:[id number] DOB: 05/14/1976 DOA: 12/29/2023  PCP: Lorren Greig PARAS, NP  Admit date: 12/29/2023 Discharge date: 01/15/24  Admitted From: Home Disposition: Home Recommendations for Outpatient Follow-up:  Outpatient follow-up with PCP in 1 to 2 weeks Outpatient follow-up with cardiothoracic surgery Check CMP and CBC at follow-up Please follow up on the following pending results: None  Home Health: No need identified Equipment/Devices: No need identified  Discharge Condition: Stable CODE STATUS: Full code Diet Orders (From admission, onward)     Start     Ordered   01/15/24 0000  Diet - low sodium heart healthy        01/15/24 0902             Follow-up Information     [facility] [place]. Go on 01/23/2024.   Specialty: Cardiothoracic Surgery Why: Appointment time is at 11:30AM Contact information: [address] [place]  [phone]        Diag Imaging at [facility] of [place]. [facility] [employer]. Go on 01/23/2024.   Specialty: Radiology Why: Please arrive by 10:30 am in order to have PA/LAT CXR taken PRIOR to office appointment with PA Contact information: [address] [place]   [phone]        Care, [facility] Follow up.   Specialty: Home Health Services Why: Hedda will contact you for the first home visit. Contact information: [address] [place] [address] [phone]         Lorren Greig PARAS, NP. Schedule an appointment as soon as possible for a visit in 1 week(s).   Specialty: Nurse Practitioner Contact information: [address] [place] [address] [phone]                 Hospital course 48 year old man w/ hx of COPD, OSA, HTN who presented with recurrent right pneumothorax. After discussion, underwent VATS w R apical bleb resection, pleural abrasion.  Pre-op noted to have significant hypoxemia and WOB so left intubated. Extubated on 7/16, transferred care to [facility] on 7/18.   Patient's pneumothorax slowly improved.  After observation with waterseal, he was transitioned to miniexpress and continued to do well.  However, he developed significant right-sided chest pain and had elevated leukocytosis on 7/25.   Pro-Cal elevated as well.  Imaging raise concern for RLL infiltrate.  Started on ceftriaxone  and doxycycline .  CT chest on 7/27 showed small multiloculated right hydropneumothorax and RLL pneumonia.  ID consulted and recommended p.o. Augmentin  for 2 more weeks.  On the day of discharge, patient felt well.  Chest tube removed.  Chest x-ray with stable loculated right apical hydropneumothorax and a stable right lung opacity.  Cleared for discharge by cardiothoracic surgery for outpatient follow-up.    See individual problem list below for more.   Problems addressed during this hospitalization Recurrent Pneumothorax,POA:  In the setting of bullous emphysema, continued tobacco abuse -S/p Right sided VATS -Extubated on 7/16. -CT chest on 7/27 with small multiloculated right hydropneumothorax and RLL infiltrate. -Ceftriaxone  and doxycycline  on 7/26>> p.o. Augmentin  for 2 more weeks per ID. -Chest tube removed on 7/28.  Chest x-ray as above. -Encouraged smoking cessation.   Bibasilar pneumonia/right hydropneumothorax: CXR on 7/26 with concern for bibasilar infiltrate.  WBC 17.8 on 7/26  (was 7.0 on 7/21).  Pro-Cal elevated to 1.8.  MRSA PCR screen negative.  Leukocytosis and procal improved after antibiotics.  CT chest on 7/27 with small multiloculated right hydropneumothorax and RLL infiltrate.  Antibiotics as above.  AKI: Baseline creatinine 0.6-0.7.  Likely due to NSAID and lisinopril .  AKI resolved.   COPD: Not in exacerbation: - Breo Ellipta and as needed albuterol  -Encouraged smoking cessation   Hypertension: Normotensive.   Tobacco  abuse: Advised strongly regarding tobacco cessation.   Leukocytosis: Due to pneumonia/cellulitis.  Resolved.   Disposition: Home, IADL   Severe malnutrition Body mass index is 23.43 kg/m. Nutrition Problem: Severe Malnutrition Etiology: acute illness (recurrent pneumothorax) Signs/Symptoms: moderate fat depletion, moderate muscle depletion, severe muscle depletion Interventions: Tube feeding     Consultations: Cardiothoracic surgery Interventional radiology Infectious disease  Time spent 35  minutes  Vital signs Vitals:   01/15/24 0304 01/15/24 0306 01/15/24 0732 01/15/24 0850  BP: 118/63  112/63   Pulse: 97  91   Temp: 99.4 F (37.4 C)     Resp: 18  20   Height:      Weight:  74.1 kg    SpO2: 92%  91% 94%  TempSrc: Oral  Oral   BMI (Calculated):  23.43       Discharge exam  GENERAL: No apparent distress.  Nontoxic. HEENT: MMM.  Vision and hearing grossly intact.  NECK: Supple.  No apparent JVD.  RESP:  No IWOB.  Fair aeration bilaterally. CVS:  RRR. Heart sounds normal.  ABD/GI/GU: BS+. Abd soft, NTND.  MSK/EXT:  Moves extremities. No apparent deformity. No edema.  SKIN: no apparent skin lesion or wound NEURO: Awake and alert. Oriented appropriately.  No apparent focal neuro deficit. PSYCH: Calm. Normal affect.  Discharge Instructions Discharge Instructions     Diet - low sodium heart healthy   Complete by: As directed    Discharge instructions   Complete by: As directed    It has been a pleasure taking care of you!  You were hospitalized due to pneumothorax and pneumonia for which you have been treated.  We are discharging him on antibiotics to complete treatment course.  Is very important that you complete the whole course of antibiotics.  Please review your new medication list and the directions on your medications before you take them.  It is important that you quit smoking cigarettes.  You may use nicotine  patch to help you quit smoking.  Nicotine   patch is available over-the-counter.  You may also discuss other options to help you quit smoking with your primary care doctor. You can also talk to professional counselors at 1-800-QUIT-NOW [phone]) for free smoking cessation counseling.     Take care,   Increase activity slowly   Complete by: As directed    No wound care   Complete by: As directed       Allergies as of 01/15/2024   No Known Allergies      Medication List     STOP taking these medications    ibuprofen  600 MG tablet Commonly known as: ADVIL    lisinopril  20 MG tablet Commonly known as: ZESTRIL        TAKE these medications    Acetaminophen  Extra Strength 500 MG Tabs Commonly known as: TYLENOL  Take 1 tablet (500 mg total) by mouth every 6 (six) hours as needed for mild pain (pain score 1-3) or moderate pain (pain score 4-6).   albuterol  (2.5 MG/3ML) 0.083% nebulizer solution Commonly known as: PROVENTIL  Take 2.5 mg by nebulization every 6 (six) hours as needed for shortness of breath or wheezing.   Ventolin  HFA 108 (90  Base) MCG/ACT inhaler Generic drug: albuterol  Inhale 2 puffs into the lungs See admin instructions. Every 4-6 hours prn   allopurinol  300 MG tablet Commonly known as: ZYLOPRIM  Take 1 tablet (300 mg total) by mouth daily.   amoxicillin -clavulanate 875-125 MG tablet Commonly known as: AUGMENTIN  Take 1 tablet by mouth every 12 (twelve) hours for 13 days.   Anoro Ellipta  62.5-25 MCG/ACT Aepb Generic drug: umeclidinium-vilanterol Inhale 1 puff into the lungs daily.   gabapentin  300 MG capsule Commonly known as: NEURONTIN  Take 1 capsule (300 mg total) by mouth daily.   nicotine  21 mg/24hr patch Commonly known as: NICODERM CQ  - dosed in mg/24 hours Place 1 patch (21 mg total) onto the skin daily.   omeprazole  20 MG capsule Commonly known as: PRILOSEC Take 1 capsule (20 mg total) by mouth daily.   oxyCODONE  5 MG immediate release tablet Commonly known as:  Roxicodone  Take 1 tablet (5 mg total) by mouth every 8 (eight) hours as needed for up to 5 days for severe pain (pain score 7-10).   senna-docusate 8.6-50 MG tablet Commonly known as: Senokot-S Take 1-2 tablets by mouth 2 (two) times daily between meals as needed for mild constipation or moderate constipation.   sildenafil  100 MG tablet Commonly known as: VIAGRA  Take 1 tablet 1/2 hour to 1 hour prior to intercourse as needed. Limit use to 1/2 tablet or 1 tablet per 24 hours.   traZODone  50 MG tablet Commonly known as: DESYREL  Take 0.5-1 tablets (25-50 mg total) by mouth at bedtime as needed for sleep.         Procedures/Studies:   DG Chest 2 View Result Date: 01/15/2024 CLINICAL DATA:  48 year old male with bullous emphysema, right side pneumothorax and recent chest tube. EXAM: CHEST - 2 VIEW COMPARISON:  Portable chest yesterday and earlier. FINDINGS: PA and lateral views 0445 hours. Right side chest tube has been removed but tube tract remains visible through right lung opacity which parallel the tube on recent chest CT 01/13/2024. Right apical air-fluid level unchanged from that CT. No other right side pneumothorax identified. Confluent and platelike right lung base opacity persists. Stable left lung ventilation. Stable cardiac size and mediastinal contours. Visualized tracheal air column is within normal limits. Stable visualized osseous structures. Negative visible bowel gas. IMPRESSION: 1. Right side chest tube removed with tube tract visible in surrounding right lung opacity. Stable loculated right apical hydropneumothorax, and stable other right lung opacity from the CT 01/13/2024. 2. No new cardiopulmonary abnormality. Electronically Signed   By: VEAR Hurst M.D.   On: 01/15/2024 08:37   DG Chest Port 1 View Result Date: 01/14/2024 CLINICAL DATA:  Pneumothorax EXAM: PORTABLE CHEST 1 VIEW COMPARISON:  Chest x-ray 01/14/2024 FINDINGS: Right chest tube is unchanged in position. Small  right apical pneumothorax is unchanged. Loculated fluid in the lower right hemithorax and right lung apex otherwise appears stable. Left lung is grossly clear. There is atelectasis and airspace disease in the right middle lobe, unchanged. The heart is enlarged, unchanged. The osseous structures are stable. IMPRESSION: 1. Stable small right apical pneumothorax. 2. Stable loculated fluid in the lower right hemithorax and right lung apex. 3. Stable atelectasis and airspace disease in the right middle lobe. Electronically Signed   By: Greig Pique M.D.   On: 01/14/2024 16:16   DG CHEST PORT 1 VIEW Result Date: 01/14/2024 CLINICAL DATA:  [id number].  Pneumothorax follow-up. EXAM: PORTABLE CHEST 1 VIEW COMPARISON:  Chest CT yesterday at 11:12 a.m. FINDINGS: 5:26 a.m. Right  chest tube with tip in the apex medially is unchanged. A small overlying loculated hydropneumothorax is also similar and again overlies surgical sutures. There is continued patchy airspace disease in right lower lung field and a small layering pleural effusion component. The lungs are emphysematous and otherwise clear. The left sulci are sharp. The cardiomediastinal silhouette is normal.  No new osseous finding. IMPRESSION: 1. Unchanged small loculated hydropneumothorax overlying surgical changes in the apex. 2. Unchanged patchy airspace disease in the right lower lung field and small layering pleural effusion component. 3. Emphysema. Electronically Signed   By: Francis Quam M.D.   On: 01/14/2024 07:43   CT CHEST W CONTRAST Result Date: 01/13/2024 CLINICAL DATA:  Pneumonia.  Hydropneumothorax. EXAM: CT CHEST WITH CONTRAST TECHNIQUE: Multidetector CT imaging of the chest was performed during intravenous contrast administration. RADIATION DOSE REDUCTION: This exam was performed according to the departmental dose-optimization program which includes automated exposure control, adjustment of the mA and/or kV according to patient size and/or use of  iterative reconstruction technique. CONTRAST:  75mL OMNIPAQUE  IOHEXOL  350 MG/ML SOLN COMPARISON:  12/31/2023 FINDINGS: Cardiovascular:  No acute findings. Mediastinum/Nodes: Shotty sub-cm mediastinal lymph nodes seen. Mild right hilar lymphadenopathy, without significant change compared to previous noncontrast CT. Lungs/Pleura: Right chest tube is seen in place. Small right hydropneumothorax is seen with a loculated component in the right apex and along the major fissure. Bilateral lower lobe and right middle lobe atelectasis has increased since previous study. New airspace opacity also seen in the lateral right lower lobe. Moderate emphysema again seen. Upper Abdomen:  Unremarkable. Musculoskeletal:  No suspicious bone lesions. IMPRESSION: Right chest tube in place, with small multiloculated right hydropneumothorax. Increased bilateral lower lobe and right middle lobe atelectasis. New airspace opacity in lateral right lower lobe, possibly due to pneumonia. Mild right hilar lymphadenopathy, likely reactive in etiology. Continued attention recommended on follow-up imaging. Emphysema (ICD10-J43.9). Electronically Signed   By: Norleen DELENA Kil M.D.   On: 01/13/2024 15:21   DG Chest Port 1 View Result Date: 01/12/2024 CLINICAL DATA:  [id number] Pneumothorax, right [id number] EXAM: PORTABLE CHEST - 1 VIEW COMPARISON:  01/11/2024 FINDINGS: Stable right chest tube directed to the apex. No conspicuous pneumothorax. Coarse bibasilar airspace opacities right greater than left slightly increased. Heart size and mediastinal contours are within normal limits. Blunting of right lateral costophrenic angle suggesting small effusion as before. Visualized bones unremarkable. IMPRESSION: 1. Stable right chest tube without pneumothorax. 2. Slightly increased bibasilar airspace disease. Electronically Signed   By: JONETTA Faes M.D.   On: 01/12/2024 07:32   DG CHEST PORT 1 VIEW Result Date: 01/11/2024 CLINICAL DATA:  Pneumothorax, status post  bleb resection pleural abrasion EXAM: PORTABLE CHEST 1 VIEW 11:48 : 31 a.m. COMPARISON:  Chest portable January 11, 2024 the FINDINGS: Small right apical pneumothorax similar to prior. Basilar component of pneumothorax is resolved. Right apical chest tube in place. Similar appearance of blunting of right costophrenic angle and right lung base ill-defined opacities. Left lung is clear. Cardiomediastinal silhouette is normal. No osseous abnormality. IMPRESSION: Stable right apical pneumothorax. Resolution of basilar component of pneumothorax. Right lung base and costophrenic ill-defined opacities, increased to prior likely representing atelectasis. Electronically Signed   By: Megan  Zare M.D.   On: 01/11/2024 12:07   DG CHEST PORT 1 VIEW Result Date: 01/11/2024 EXAM: 1 VIEW XRAY OF THE CHEST 01/11/2024 03:03:00 AM COMPARISON: 01/10/2024 CLINICAL HISTORY: Chest pain; shortness of breath; recurrent pneumothorax. FINDINGS: LUNGS AND PLEURA: Stable small right hydropneumothorax with dominant basilar  pneumothorax component. No cardiomediastinal shift to suggest tension physiology. HEART AND MEDIASTINUM: No acute abnormality of the cardiac and mediastinal silhouettes. BONES AND SOFT TISSUES: Stable right apical chest tube. No acute osseous abnormality. IMPRESSION: 1. Stable small right hydropneumothorax with dominant basilar pneumothorax component. 2. Stable right apical chest tube. Electronically signed by: Pinkie Pebbles MD 01/11/2024 03:10 AM EDT RP Workstation: [id number]   DG Chest Port 1 View Result Date: 01/10/2024 CLINICAL DATA:  [id number] Pneumothorax, right [id number] EXAM: PORTABLE CHEST - 1 VIEW COMPARISON:  Multiple, most recently January 09, 2024 FINDINGS: Right thoracostomy tube is similarly positioned terminating in the right lung apex. Right apical pneumothorax is unchanged, measuring 2.6 cm at the lung apex (previously, 2.5 cm, by my measurement). Persistent patchy opacities in the right lung base, likely  atelectasis. Trace right pleural effusion. No cardiomegaly. No acute fracture or destructive lesion. IMPRESSION: Similar right apical pneumothorax measuring 2.6 cm with unchanged positioning of the right thoracostomy tube. Electronically Signed   By: Rogelia Myers M.D.   On: 01/10/2024 09:43   DG CHEST PORT 1 VIEW Result Date: 01/09/2024 EXAM: 1 VIEW XRAY OF THE CHEST 01/09/2024 06:12:00 AM COMPARISON: 01/08/2024 CLINICAL HISTORY: Pneumothorax FINDINGS: LUNGS AND PLEURA: Increase in small-to-moderate right pneumothorax with the visceral pleural line 2.5 cm from the chest wall today versus 2.2 cm on the prior. Increased inferolateral component as well. Right-sided chest tube is unchanged in position. Interstitial thickening and coarsening without lobar consolidation. Mild atelectasis at the left lung base. HEART AND MEDIASTINUM: No acute abnormality of the cardiac and mediastinal silhouettes. BONES AND SOFT TISSUES: No acute osseous abnormality. IMPRESSION: 1. Increase in small-to-moderate right pneumothorax. 2. Right-sided chest tube is unchanged in position. Electronically signed by: Rockey Kilts MD 01/09/2024 10:02 AM EDT RP Workstation: [id number]   DG CHEST PORT 1 VIEW Result Date: 01/08/2024 CLINICAL DATA:  Lung surgery. EXAM: PORTABLE CHEST 1 VIEW COMPARISON:  01/07/2024 FINDINGS: RIGHT chest tube in place. Sub pulmonic RIGHT pneumothorax not changed from prior. Normal mediastinum and cardiac silhouette. No pulmonary edema. RIGHT apical pneumothorax measures 21 mm from the apical chest wall. IMPRESSION: 1. Small RIGHT apical pneumothorax.  Chest tube in place. 2. No change in sub pulmonic RIGHT pneumothorax. Electronically Signed   By: Jackquline Boxer M.D.   On: 01/08/2024 10:13   DG CHEST PORT 1 VIEW Result Date: 01/07/2024 CLINICAL DATA:  Status post lung surgery, right pneumothorax. EXAM: PORTABLE CHEST 1 VIEW COMPARISON:  01/06/2024 and CT chest 12/31/2023. FINDINGS: Trachea is midline. Heart  size normal. Right chest tube terminates at the apex of the right hemithorax. Biapical bullous disease. Platelike atelectasis in the base of the right hemithorax. Small right pleural effusion, similar. IMPRESSION: 1. No definite pneumothorax with right chest tube in place. 2. Platelike atelectasis in the right lung base with streaky bibasilar atelectasis. Electronically Signed   By: Newell Eke M.D.   On: 01/07/2024 09:09   DG CHEST PORT 1 VIEW Result Date: 01/06/2024 CLINICAL DATA:  48 year old male with bullous emphysema, right side pneumothorax, chest tube. EXAM: PORTABLE CHEST 1 VIEW COMPARISON:  Portable chest yesterday and earlier. FINDINGS: Portable AP semi upright view at 0609 hours. Stable right chest tube coursing to the apex. No definite pneumothorax visible today. Stable indistinct right lower lung opacity, in part adjacent to the hilum. Stable large lung volumes. Mediastinal contours remain within normal limits. Left lung appears stable and negative. Paucity of bowel gas. No acute osseous abnormality identified. No subcutaneous emphysema. IMPRESSION: 1. Stable right chest  tube. No definite pneumothorax today. 2. Stable right lower lung opacity. No new cardiopulmonary abnormality. Electronically Signed   By: VEAR Hurst M.D.   On: 01/06/2024 08:57   DG CHEST PORT 1 VIEW Result Date: 01/05/2024 CLINICAL DATA:  48 year old male with bullous emphysema, right side pneumothorax, chest tube. EXAM: PORTABLE CHEST 1 VIEW COMPARISON:  Portable chest yesterday and earlier. FINDINGS: Portable AP semi upright view at 0614 hours. Stable right chest tube coursing to the lung apex. Trace pneumothorax is visible in the right upper lung periphery, pleural edge just deep to the lateral 3rd rib. Improved right lung base ventilation since yesterday with largely resolved indistinct opacity there. Residual along the right heart border. Stable right lung apex mild opacification. Stable cardiac size and mediastinal  contours. Negative left lung. Stable visualized osseous structures. IMPRESSION: 1. Stable right chest tube. Trace right pneumothorax. 2. Regressed but not fully resolved right lung base opacity since yesterday. No areas of worsening ventilation. Electronically Signed   By: VEAR Hurst M.D.   On: 01/05/2024 08:28   DG Chest Port 1 View Result Date: 01/04/2024 EXAM: 1 VIEW XRAY OF THE CHEST 01/04/2024 06:07:45 AM COMPARISON: 01/03/2024 CLINICAL HISTORY: Pneumothorax FINDINGS: LUNGS AND PLEURA: Right chest tube is again noted with tip terminating over the right apex. Persistent small right pneumothorax with apical and basilar components which are unchanged from the previous exam. Progressive opacification within the medial and lateral right lower lung. Mild subsegmental atelectasis noted in the left base. HEART AND MEDIASTINUM: The cardiomediastinal contours are stable. BONES AND SOFT TISSUES: No acute osseous abnormality. IMPRESSION: 1. Persistent small right pneumothorax with apical and basilar components, unchanged from the previous exam. 2. Progressive opacification within the medial and lateral right lower lung. 3. Mild subsegmental atelectasis in the left base. Electronically signed by: Waddell Calk MD 01/04/2024 08:36 AM EDT RP Workstation: [id number]   DG Chest Port 1 View Result Date: 01/03/2024 CLINICAL DATA:  Follow up pneumothorax. EXAM: PORTABLE CHEST 1 VIEW COMPARISON:  Radiographs 01/01/2024 and 12/31/2023.  CT 12/31/2023. FINDINGS: 0744 hours. Right chest tube extends to the right lung apex. Recurrent small right-sided pneumothorax with apical and basilar components. There are lower lung volumes with increased patchy airspace opacities at both lung bases. Probable surgical clips at the right lung apex. The heart size and mediastinal contours are stable, without mediastinal shift. No significant pleural effusion. No acute osseous findings. IMPRESSION: 1. Recurrent small right-sided pneumothorax with  chest tube in place. 2. Lower lung volumes with increased patchy airspace opacities at both lung bases, likely atelectasis. Electronically Signed   By: Elsie Perone M.D.   On: 01/03/2024 11:41   VAS US  LOWER EXTREMITY VENOUS (DVT) Result Date: 01/01/2024  Lower Venous DVT Study Patient Name:  CARDALE DORER  Date of Exam:   01/01/2024 Medical Rec #: [id number]              Accession #:    [id number] Date of Birth: March 17, 1976              Patient Gender: M Patient Age:   66 years Exam Location:  [facility] Procedure:      VAS US  LOWER EXTREMITY VENOUS (DVT) Referring Phys: TORIBIO SHARPS --------------------------------------------------------------------------------  Indications: Edema, and Status post heart surgery 12/31/23.  Comparison Study: 08/15/21 - Left lower ext venous - negative Performing Technologist: Ricka Sturdivant-Jones RDMS, RVT  Examination Guidelines: A complete evaluation includes B-mode imaging, spectral Doppler, color Doppler, and power Doppler as needed of all  accessible portions of each vessel. Bilateral testing is considered an integral part of a complete examination. Limited examinations for reoccurring indications may be performed as noted. The reflux portion of the exam is performed with the patient in reverse Trendelenburg.  +---------+---------------+---------+-----------+----------+--------------+ RIGHT    CompressibilityPhasicitySpontaneityPropertiesThrombus Aging +---------+---------------+---------+-----------+----------+--------------+ CFV      Full           Yes      Yes                                 +---------+---------------+---------+-----------+----------+--------------+ SFJ      Full                                                        +---------+---------------+---------+-----------+----------+--------------+ FV Prox  Full                                                         +---------+---------------+---------+-----------+----------+--------------+ FV Mid   Full                                                        +---------+---------------+---------+-----------+----------+--------------+ FV DistalFull                                                        +---------+---------------+---------+-----------+----------+--------------+ PFV      Full                                                        +---------+---------------+---------+-----------+----------+--------------+ POP      Full           Yes      Yes                                 +---------+---------------+---------+-----------+----------+--------------+ PTV      Full                                                        +---------+---------------+---------+-----------+----------+--------------+ PERO     Full                                                        +---------+---------------+---------+-----------+----------+--------------+   +---------+---------------+---------+-----------+----------+--------------+ LEFT  CompressibilityPhasicitySpontaneityPropertiesThrombus Aging +---------+---------------+---------+-----------+----------+--------------+ CFV      Full           Yes      Yes                                 +---------+---------------+---------+-----------+----------+--------------+ SFJ      Full                                                        +---------+---------------+---------+-----------+----------+--------------+ FV Prox  Full                                                        +---------+---------------+---------+-----------+----------+--------------+ FV Mid   Full                                                        +---------+---------------+---------+-----------+----------+--------------+ FV DistalFull                                                         +---------+---------------+---------+-----------+----------+--------------+ PFV      Full                                                        +---------+---------------+---------+-----------+----------+--------------+ POP      Full           Yes      Yes                                 +---------+---------------+---------+-----------+----------+--------------+ PTV      Full                                                        +---------+---------------+---------+-----------+----------+--------------+ PERO     Full                                                        +---------+---------------+---------+-----------+----------+--------------+     Summary: BILATERAL: - No evidence of deep vein thrombosis seen in the lower extremities, bilaterally. -No evidence of popliteal cyst, bilaterally.   *See table(s) above for measurements and observations. Electronically signed by Penne Colorado MD on 01/01/2024 at 1:20:14 PM.    Final  DG Chest Port 1 View Result Date: 01/01/2024 CLINICAL DATA:  48 year old male with bullous emphysema, right side pneumothorax, right chest tube. EXAM: PORTABLE CHEST 1 VIEW COMPARISON:  Portable chest yesterday and earlier. FINDINGS: Portable AP semi upright view at 0543 hours. Intubated and now an enteric tube courses to the left upper quadrant, side hole at the level of the gastric fundus. Stable right chest tube coursing to the apex. Substantially decreased Patchy and confluent right lung base opacity since yesterday, mild residual streaky peribronchial opacity. Mildly improved lung volumes. Stable cardiac size and mediastinal contours. No new pulmonary opacity. IMPRESSION: 1. Satisfactory new enteric tube placement since yesterday, terminating in the stomach. 2. Stable right chest tube. No residual pneumothorax. 3. Improved right lung base ventilation and no new cardiopulmonary abnormality. Electronically Signed   By: VEAR Hurst M.D.   On: 01/01/2024 09:34    DG Abd 1 View Result Date: 12/31/2023 CLINICAL DATA:  Orogastric tube placement EXAM: ABDOMEN - 1 VIEW COMPARISON:  None Available. FINDINGS: Nasogastric tube tip seen within the proximal body of the stomach. The visualized abdominal gas pattern is nonobstructive. Moderate hepatomegaly suspected. Small right pleural effusion noted. IMPRESSION: 1. Nasogastric tube tip within the proximal body of the stomach. 2. Small right pleural effusion. 3. Suspect hepatomegaly Electronically Signed   By: Dorethia Molt M.D.   On: 12/31/2023 21:10   DG CHEST PORT 1 VIEW Result Date: 12/31/2023 CLINICAL DATA:  [id number] History of ETT [id number] EXAM: PORTABLE CHEST 1 VIEW COMPARISON:  12/30/2023. FINDINGS: Since the prior study, there is new homogeneous opacity overlying the right lower lung zone, laterally with resultant obscuration of the right lateral hemidiaphragm. Findings favor atelectasis versus consolidation/aspiration. Right-sided pleural drainage catheter again seen. No discrete pneumothorax. Left lung and left lateral costophrenic angle are clear. Normal cardio-mediastinal silhouette. No acute osseous abnormalities. The soft tissues are within normal limits. Endotracheal tube is seen with its tip approximately 5.5 cm above the carina. IMPRESSION: *New homogeneous opacity overlying the right lower lung zone, laterally with resultant obscuration of the right lateral hemidiaphragm. Findings favor atelectasis versus consolidation/aspiration. Right-sided pleural drainage catheter again seen. No discrete pneumothorax. *Endotracheal tube is seen with its tip approximately 5.5 cm above the carina. Electronically Signed   By: Ree Molt M.D.   On: 12/31/2023 16:25   CT CHEST WO CONTRAST Result Date: 12/31/2023 CLINICAL DATA:  Follow-up pneumothorax EXAM: CT CHEST WITHOUT CONTRAST TECHNIQUE: Multidetector CT imaging of the chest was performed following the standard protocol without IV contrast. RADIATION DOSE REDUCTION:  This exam was performed according to the departmental dose-optimization program which includes automated exposure control, adjustment of the mA and/or kV according to patient size and/or use of iterative reconstruction technique. COMPARISON:  Chest x-ray from the previous day. FINDINGS: Cardiovascular: Heart is not significantly enlarged in size. No aortic aneurysmal dilatation is seen. Mild atherosclerotic calcifications of the aorta are noted. Mediastinum/Nodes: Thoracic inlet is within normal limits. No sizable hilar or mediastinal adenopathy is noted. The esophagus as visualized is within normal limits. Lungs/Pleura: Significant emphysematous changes are identified. Tiny anterior right pneumothorax is seen. Pigtail catheter is noted on the right anteriorly. Some subcutaneous air is noted likely related to the recent placement. Small right-sided pleural effusion is noted. Scattered nodules are noted throughout both lungs the largest of which is seen in the right lower lobe measuring 4 mm in dimension best seen on image number 117 of series 4. Some mild left basilar atelectasis is seen. Upper Abdomen: No acute abnormality.  Musculoskeletal: No chest wall mass or suspicious bone lesions identified. IMPRESSION: Tiny residual right pneumothorax is noted. Scattered pulmonary nodules the largest of which measures 4 mm in the right lower lobe. No routine follow-up is recommended. Pigtail catheter noted in satisfactory position. Aortic Atherosclerosis (ICD10-I70.0) and Emphysema (ICD10-J43.9). Electronically Signed   By: Oneil Devonshire M.D.   On: 12/31/2023 01:23   DG CHEST PORT 1 VIEW Result Date: 12/30/2023 EXAM: 1 VIEW XRAY OF THE CHEST 12/30/2023 09:49:58 AM COMPARISON: 1 view chest x-ray 12/29/2023. CLINICAL HISTORY: Pneumothorax. FINDINGS: LUNGS AND PLEURA: A small apical pneumothorax is present. Right basilar airspace opacity and effusion is noted. The left lung is clear. HEART AND MEDIASTINUM: No acute  abnormality of the cardiac and mediastinal silhouettes. BONES AND SOFT TISSUES: No acute osseous abnormality. LINES AND TUBES: The pigtail catheter is stable in position. IMPRESSION: 1. Small apical pneumothorax. 2. Right basilar airspace opacity and effusion. 3. Stable pigtail catheter position. Electronically signed by: Lonni Necessary MD 12/30/2023 10:31 AM EDT RP Workstation: [id number]   DG Chest Portable 1 View Result Date: 12/29/2023 CLINICAL DATA:  Chest tube placement.  Right-sided pneumothorax. EXAM: PORTABLE CHEST 1 VIEW COMPARISON:  12/29/2023 at 5:54 p.m. FINDINGS: Pigtail right pleural drainage catheter placed with substantial re-expansion of the right lung. Suspected moderate residual right apical pneumothorax at about 20% of right hemithoracic volume. Previous tension component has resolved. Bandlike atelectasis at the right lung base. Left lung remains clear. Cardiac and mediastinal margins appear normal. IMPRESSION: 1. Pigtail right pleural drainage catheter placed with substantial re-expansion of the right lung. Suspected moderate residual right apical pneumothorax at about 20% of right hemithoracic volume. Previous tension component has resolved. 2. Bandlike atelectasis at the right lung base. Electronically Signed   By: Ryan Salvage M.D.   On: 12/29/2023 18:46   DG Chest Port 1 View Result Date: 12/29/2023 CLINICAL DATA:  Shortness of breath, recent pneumothorax EXAM: PORTABLE CHEST 1 VIEW COMPARISON:  12/22/2023 FINDINGS: Large right pneumothorax with near complete collapse of the right lung. There are some signs of tension physiology with leftward mediastinal shift. The left lung is clear. Stable heart size. No displaced rib fracture. IMPRESSION: Large right pneumothorax with signs of tension physiology. Critical Value/emergent results were called by telephone at the time of interpretation on 12/29/2023 at 6:17 pm to provider DAVID YAO , who verbally acknowledged these results.  Electronically Signed   By: Norman Gatlin M.D.   On: 12/29/2023 18:24   DG Chest 1 View Result Date: 12/22/2023 CLINICAL DATA:  Chest tube removal EXAM: CHEST  1 VIEW COMPARISON:  12/22/2023, 12/20/2023 FINDINGS: Hyperinflation with emphysema and apical bulla. Removal of right-sided chest tube. No definitive pneumothorax is seen. Minimal scarring or atelectasis left base. Stable cardiomediastinal silhouette. IMPRESSION: Removal of right-sided chest tube. No definitive pneumothorax. Emphysema. Electronically Signed   By: Luke Bun M.D.   On: 12/22/2023 14:43   DG Chest 2 View Result Date: 12/22/2023 CLINICAL DATA:  48 year old male with right side pneumothorax, chest tube. EXAM: CHEST - 2 VIEW COMPARISON:  12/21/2023 and earlier. FINDINGS: PA and lateral views 0635 hours. Right lateral pleural space pigtail catheter appears unchanged. No pneumothorax identified. Trace right chest wall soft tissue gas. Large lung volumes. Normal cardiac size and mediastinal contours. Visualized tracheal air column is within normal limits. Apical bullous emphysema on the right. Lung markings appear stable since 2021. No pleural effusion. No acute osseous abnormality identified. Negative visible bowel gas. IMPRESSION: 1. Stable right pleural pigtail catheter with  no pneumothorax. Evidence of right apical bullous Emphysema (ICD10-J43.9). 2. No acute cardiopulmonary abnormality. Electronically Signed   By: VEAR Hurst M.D.   On: 12/22/2023 06:59   DG CHEST PORT 1 VIEW Result Date: 12/21/2023 CLINICAL DATA:  Right-sided pneumothorax. EXAM: PORTABLE CHEST 1 VIEW COMPARISON:  12/20/2023 FINDINGS: Right pleural drain again noted without discernible right-sided pneumothorax. The lungs are clear without focal pneumonia, edema, or pleural effusion. The cardiopericardial silhouette is within normal limits for size. No acute bony abnormality. Telemetry leads overlie the chest. IMPRESSION: Right pleural drain without discernible right-sided  pneumothorax. Electronically Signed   By: Camellia Candle M.D.   On: 12/21/2023 09:00   DG Chest 1 View Result Date: 12/20/2023 CLINICAL DATA:  Pneumothorax. EXAM: CHEST  1 VIEW COMPARISON:  02/24/2020 and older studies. FINDINGS: Pigtail chest tube lies along the right lateral mid thoracic pleural space. No pneumothorax. There are linear opacities at the apices consistent with scarring, as well as lucency consistent with emphysema. Lungs are hyperexpanded, but otherwise clear. No pleural effusion. Cardiac silhouette is normal in size. No mediastinal or hilar masses. Skeletal structures are grossly intact. IMPRESSION: 1. Right-sided chest tube as detailed. No pneumothorax. No acute cardiopulmonary disease. 2. Emphysema. Electronically Signed   By: Alm Parkins M.D.   On: 12/20/2023 07:25       The results of significant diagnostics from this hospitalization (including imaging, microbiology, ancillary and laboratory) are listed below for reference.     Microbiology: Recent Results (from the past 240 hours)  MRSA Next Gen by PCR, Nasal     Status: None   Collection Time: 01/12/24  7:45 AM   Specimen: Nasal Mucosa; Nasal Swab  Result Value Ref Range Status   MRSA by PCR Next Gen NOT DETECTED NOT DETECTED Final    Comment: (NOTE) The GeneXpert MRSA Assay (FDA approved for NASAL specimens only), is one component of a comprehensive MRSA colonization surveillance program. It is not intended to diagnose MRSA infection nor to guide or monitor treatment for MRSA infections. Test performance is not FDA approved in patients less than 68 years old. Performed at [facility], [address], [place], [place] [address]      Labs:  CBC: Recent Labs  Lab 01/12/24 0226 01/13/24 0244 01/14/24 0219 01/15/24 0315  WBC 17.8* 16.2* 11.6* 10.0  NEUTROABS  --  12.6*  --   --   HGB 10.8* 10.6* 9.5* 10.3*  HCT 31.1* 30.6* 27.4* 29.7*  MCV 88.9 89.2 89.3 88.9  PLT 344 339 320 408*   BMP  &GFR Recent Labs  Lab 01/12/24 0226 01/13/24 0244 01/14/24 0219 01/15/24 0315  NA 130* 131* 131* 134*  K 4.0 4.4 4.1 4.4  CL 95* 98 97* 96*  CO2 26 25 25 25   GLUCOSE 127* 117* 130* 124*  BUN 27* 18 16 12   CREATININE 1.08 0.78 0.84 0.70  CALCIUM 8.6* 8.4* 8.3* 8.5*  MG 1.7 1.9 1.8 1.6*  PHOS 3.6 3.1 3.4 3.5   Estimated Creatinine Clearance: 115.3 mL/min (by C-G formula based on SCr of 0.7 mg/dL). Liver & Pancreas: Recent Labs  Lab 01/12/24 0226 01/13/24 0244 01/14/24 0219 01/15/24 0315  ALBUMIN  2.3* 2.3* 2.2* 2.2*   No results for input(s): LIPASE, AMYLASE in the last 168 hours. No results for input(s): AMMONIA in the last 168 hours. Diabetic: No results for input(s): HGBA1C in the last 72 hours. No results for input(s): GLUCAP in the last 168 hours. Cardiac Enzymes: No results for input(s):  CKTOTAL, CKMB, CKMBINDEX, TROPONINI in the last 168 hours. No results for input(s): PROBNP in the last 8760 hours. Coagulation Profile: No results for input(s): INR, PROTIME in the last 168 hours. Thyroid Function Tests: No results for input(s): TSH, T4TOTAL, FREET4, T3FREE, THYROIDAB in the last 72 hours. Lipid Profile: No results for input(s): CHOL, HDL, LDLCALC, TRIG, CHOLHDL, LDLDIRECT in the last 72 hours. Anemia Panel: No results for input(s): VITAMINB12, FOLATE, FERRITIN, TIBC, IRON, RETICCTPCT in the last 72 hours. Urine analysis:    Component Value Date/Time   COLORURINE YELLOW (A) 01/04/2022 1207   APPEARANCEUR HAZY (A) 01/04/2022 1207   APPEARANCEUR Clear 01/22/2019 1023   LABSPEC 1.016 01/04/2022 1207   PHURINE 5.0 01/04/2022 1207   GLUCOSEU NEGATIVE 01/04/2022 1207   HGBUR NEGATIVE 01/04/2022 1207   BILIRUBINUR NEGATIVE 01/04/2022 1207   BILIRUBINUR Negative 01/22/2019 1023   KETONESUR NEGATIVE 01/04/2022 1207   PROTEINUR NEGATIVE 01/04/2022 1207   NITRITE NEGATIVE 01/04/2022 1207   LEUKOCYTESUR  NEGATIVE 01/04/2022 1207   Sepsis Labs: Invalid input(s): PROCALCITONIN, LACTICIDVEN   SIGNED:  Ryenn Howeth T Iliyana Convey, MD  [facility] 01/15/2024, 4:12 PM

## 2024-01-15 NOTE — Progress Notes (Signed)
Went over discharge paper work with patient. All questions answered. PIV and telemetry removed. All belongings at bedside.  

## 2024-01-15 NOTE — Progress Notes (Addendum)
 [address] [employer] [address]             [phone]      15 Days Post-Op Procedure(s) (LRB): ROBOTIC ASSISTED RIGHT VIDEO ASSISTED THORACOSCOPIC SURGERY, BLEB RESECTION, PLEURAL ABRASION (Right) BLOCK, NERVE, INTERCOSTAL (Right) Subjective: Patient reports he is worse out because he was not able to get any sleep last night. States he overall feels better.  Objective: Vital signs in last 24 hours: Temp:  [98.7 F (37.1 C)-99.4 F (37.4 C)] 99.4 F (37.4 C) (07/29 0304) Pulse Rate:  [89-98] 91 (07/29 0732) Cardiac Rhythm: Normal sinus rhythm (07/28 1900) Resp:  [18-20] 20 (07/29 0732) BP: (96-126)/(49-66) 112/63 (07/29 0732) SpO2:  [91 %-94 %] 91 % (07/29 0732) Weight:  [74.1 kg] 74.1 kg (07/29 0306)  Hemodynamic parameters for last 24 hours:    Intake/Output from previous day: 07/28 0701 - 07/29 0700 In: 720 [P.O.:720] Out: 5 [Chest Tube:5] Intake/Output this shift: No intake/output data recorded.  General appearance: alert, cooperative, no distress, and fatigued Neurologic: intact Heart: regular rate and rhythm, S1, S2 normal, no murmur, click, rub or gallop Lungs: wheezing throughout Extremities: extremities normal, atraumatic, no cyanosis or edema Wound: Chest tube site erythema has improved but some purulent discharge from chest tube site. Other incisions are healing well without sign of infection.   Lab Results: Recent Labs    01/14/24 0219 01/15/24 0315  WBC 11.6* 10.0  HGB 9.5* 10.3*  HCT 27.4* 29.7*  PLT 320 408*   BMET:  Recent Labs    01/14/24 0219 01/15/24 0315  NA 131* 134*  K 4.1 4.4  CL 97* 96*  CO2 25 25  GLUCOSE 130* 124*  BUN 16 12  CREATININE 0.84 0.70  CALCIUM 8.3* 8.5*    PT/INR: No results for input(s): LABPROT, INR in the last 72 hours. ABG    Component Value Date/Time   PHART 7.447 01/02/2024 0601   HCO3 24.9 01/02/2024 0601   TCO2 26 01/02/2024 0601   ACIDBASEDEF 1.0 12/31/2023 1540    O2SAT 97 01/02/2024 0601   CBG (last 3)  No results for input(s): GLUCAP in the last 72 hours.  Assessment/Plan: S/P Procedure(s) (LRB): ROBOTIC ASSISTED RIGHT VIDEO ASSISTED THORACOSCOPIC SURGERY, BLEB RESECTION, PLEURAL ABRASION (Right) BLOCK, NERVE, INTERCOSTAL (Right)  CV: NSR, VS stable   Pulm: Chest tube removed yesterday, CXR with stable small loculated hydropneumothorax. Chest CT 07/27 with small multiloculated right hydropneumothorax, increased bilateral lower lobe and right right middle lobe atelectasis and new airspace opacity in lateral right lower lobe possibly due to pneumonia as well as mild right hilar lymphadenopathy likely reactive. On [place] this AM while lying in bed but overall has been saturating well on RA. Encourage IS and ambulation. Continue nebs for COPD and have discussed smoking cessation.    ID: Was on Ceftriaxone  and Doxycycline  for bibasilar infiltrates on CXR and possible cellulitis around chest tube with leukocytosis per primary team. Now transitioned to Augmentin  x 2 weeks per ID. Erythema improved but purulent drainage from chest tube site this AM, per RN green drainage from chest tube site when she removed the tube yesterday. Leukocytosis resolved. Tmax 99.4.    Dispo: Will discuss with surgeon but patient is stable for discharge on ABX from CT surgery perspective   LOS: 17 days    Con GORMAN Bend,  PA-C 01/15/2024  Agree Stable CXR Please call with questions  Linnie MALVA Rayas

## 2024-01-16 ENCOUNTER — Telehealth: Payer: Self-pay

## 2024-01-16 NOTE — Transitions of Care (Post Inpatient/ED Visit) (Unsigned)
   01/16/2024  Name: Albert Hill MRN: [id number] DOB: 1976-06-06  Today's TOC FU Call Status: Today's TOC FU Call Status:: Unsuccessful Call (1st Attempt) Unsuccessful Call (1st Attempt) Date: 01/16/24  Attempted to reach the patient regarding the most recent Inpatient/ED visit.  Follow Up Plan: Additional outreach attempts will be made to reach the patient to complete the Transitions of Care (Post Inpatient/ED visit) call.   Signature Julian Lemmings, LPN [facility] Nurse Health Advisor Direct Dial [phone]

## 2024-01-17 ENCOUNTER — Ambulatory Visit

## 2024-01-17 DIAGNOSIS — Z48813 Encounter for surgical aftercare following surgery on the respiratory system: Secondary | ICD-10-CM | POA: Diagnosis not present

## 2024-01-17 NOTE — Transitions of Care (Post Inpatient/ED Visit) (Signed)
   01/17/2024  Name: Albert Hill MRN: [id number] DOB: Feb 09, 1976  Today's TOC FU Call Status: Today's TOC FU Call Status:: Successful TOC FU Call Completed Unsuccessful Call (1st Attempt) Date: 01/16/24 [facility] FU Call Complete Date: 01/17/24 Patient's Name and Date of Birth confirmed.  Transition Care Management Follow-up Telephone Call Date of Discharge: 01/15/24 Discharge Facility: [facility]) Type of Discharge: Inpatient Admission Primary Inpatient Discharge Diagnosis:: pneumonia How have you been since you were released from the hospital?: Better Any questions or concerns?: No  Items Reviewed: Did you receive and understand the discharge instructions provided?: Yes Medications obtained,verified, and reconciled?: Yes (Medications Reviewed) Any new allergies since your discharge?: No Dietary orders reviewed?: Yes Do you have support at home?: Yes People in Home [RPT]: significant other  Medications Reviewed Today: Medications Reviewed Today   Medications were not reviewed in this encounter     Home Care and Equipment/Supplies: Were Home Health Services Ordered?: Yes Name of Home Health Agency:: unknown Has Agency set up a time to come to your home?: Yes First Home Health Visit Date: 01/17/24 Any new equipment or medical supplies ordered?: Yes Name of Medical supply agency?: unknown Were you able to get the equipment/medical supplies?: Yes Do you have any questions related to the use of the equipment/supplies?: No  Functional Questionnaire: Do you need assistance with bathing/showering or dressing?: No Do you need assistance with meal preparation?: No Do you need assistance with eating?: No Do you have difficulty maintaining continence: No Do you need assistance with getting out of bed/getting out of a chair/moving?: No Do you have difficulty managing or taking your medications?: No  Follow up appointments reviewed: PCP Follow-up appointment confirmed?:  Yes Date of PCP follow-up appointment?: 01/28/24 Follow-up Provider: [facility] Follow-up appointment confirmed?: NA Do you need transportation to your follow-up appointment?: No Do you understand care options if your condition(s) worsen?: Yes-patient verbalized understanding    SIGNATURE Julian Lemmings, LPN [facility] Nurse Health Advisor Direct Dial [phone]

## 2024-01-18 NOTE — Telephone Encounter (Signed)
Keep all scheduled appointments 

## 2024-01-21 ENCOUNTER — Telehealth: Payer: Self-pay | Admitting: *Deleted

## 2024-01-21 ENCOUNTER — Other Ambulatory Visit: Payer: Self-pay | Admitting: Surgical

## 2024-01-21 MED ORDER — OXYCODONE HCL 5 MG PO TABS: 5.0000 mg | ORAL_TABLET | Freq: Four times a day (QID) | ORAL | 0 refills | Status: DC | PRN

## 2024-01-21 NOTE — Progress Notes (Signed)
       Oxycodone  5 mg po q 6 hours refilled for ongoing pain. Will need to de-escalate if conts to be an issue. He has some chronic pain issues.  Rukaya Kleinschmidt E Charlton Boule, PA-C

## 2024-01-21 NOTE — Telephone Encounter (Signed)
 Patient contacted the answering service over the weekend for pain medication. States he ran out of Oxycodone  on Saturday. States he is taking every 8 hours the best he can although he admits having to take sooner than the 8 hr mark. States pain is in the right shoulder, right chest and left arm. Advised patient a refill has been sent to preferred pharmacy. Advised patient he must only take the narcotics as prescribed. Advised patient he may alternate oxy and Tylenol . Reviewed upcoming appt on 8/6 with our office PA. Advised for need for CXR prior. Patient verbalized understanding.

## 2024-01-22 ENCOUNTER — Other Ambulatory Visit: Payer: Self-pay | Admitting: Surgery

## 2024-01-22 DIAGNOSIS — J9383 Other pneumothorax: Secondary | ICD-10-CM

## 2024-01-22 NOTE — Progress Notes (Unsigned)
 [address] [place] [address]             [phone]       HPI: [name] is a 48 year old male with medical history of recurrent right pneumothorax, COPD, hypertension, GERD, gout and tobacco user returns for routine postoperative follow-up having undergone robotic assisted right video-assisted thoracoscopic surgery, bleb resection, pleural abrasion right side, nerve block intercostal right side on 12/31/2023 with Dr. Shyrl.  The patient's early postoperative recovery while in the hospital was notable significant hypoxemia and WOB.  He was left intubated after surgery and was extubated 2 days later.  His chest tube was transition to a mini express on waterseal but he then started to have right sided chest pain.  He had leukocytosis so ceftriaxone  and doxycycline  were initiated.  CT scan of chest showed small multiloculated right hydropneumothorax and right lower lobe pneumonia.  Infectious disease was consulted and recommended that Augmentin  be prescribed for 2 weeks.  His chest tube was then removed and chest x-ray showed stable loculated right apical hydropneumothorax and stable right lung opacity.  He was discharged in stable condition on 01/15/2024.  Since hospital discharge the patient reports that he has been doing well.  He has some chest wall pain on right side with numbness. He also has been having some left-sided shoulder pain and weakness.  This started after being admitted to the hospital.  He feels that he is very rundown and fatigued.This is slowly gotten better since discharge.  His appetite has been good.  He finds that his exercise tolerance has slowly started to increase but at times finds that if he does too much he is tired.  He has not smoked cigarettes since he was admitted.  He plans to continue his smoking cessation but he has been dipping tobacco.  He plan to quit dipping soon.  He denies shortness of breath.  He has not  needed to use his albuterol  inhaler or nebulizer. He is still taking Augmentin  as prescribed and reports that he has been doing well with this medication.        Current Outpatient Medications  Medication Sig Dispense Refill   acetaminophen  (TYLENOL ) 500 MG tablet Take 1 tablet (500 mg total) by mouth every 6 (six) hours as needed for mild pain (pain score 1-3) or moderate pain (pain score 4-6). 20 tablet 0   albuterol  (PROVENTIL ) (2.5 MG/3ML) 0.083% nebulizer solution Take 2.5 mg by nebulization every 6 (six) hours as needed for shortness of breath or wheezing.     allopurinol  (ZYLOPRIM ) 300 MG tablet Take 1 tablet (300 mg total) by mouth daily. 90 tablet 0   amoxicillin -clavulanate (AUGMENTIN ) 875-125 MG tablet Take 1 tablet by mouth every 12 (twelve) hours for 13 days. 26 tablet 0   gabapentin  (NEURONTIN ) 300 MG capsule Take 1 capsule (300 mg total) by mouth daily. 90 capsule 0   nicotine  (NICODERM CQ  - DOSED IN MG/24 HOURS) 21 mg/24hr patch Place 1 patch (21 mg total) onto the skin daily.     omeprazole  (PRILOSEC) 20 MG capsule Take 1 capsule (20 mg total) by mouth daily. 90 capsule 0   oxyCODONE  (OXY IR/ROXICODONE ) 5 MG immediate release tablet Take 1 tablet (5 mg total) by mouth every 6 (six) hours as needed for up to 5 days for severe pain (pain score 7-10). 20 tablet 0   senna-docusate (SENOKOT-S) 8.6-50 MG tablet Take 1-2 tablets by mouth  2 (two) times daily between meals as needed for mild constipation or moderate constipation.     sildenafil  (VIAGRA ) 100 MG tablet Take 1 tablet 1/2 hour to 1 hour prior to intercourse as needed. Limit use to 1/2 tablet or 1 tablet per 24 hours. 90 tablet 0   traZODone  (DESYREL ) 50 MG tablet Take 0.5-1 tablets (25-50 mg total) by mouth at bedtime as needed for sleep. 90 tablet 0   umeclidinium-vilanterol (ANORO ELLIPTA ) 62.5-25 MCG/ACT AEPB Inhale 1 puff into the lungs daily. 60 each 0   VENTOLIN  HFA 108 (90 Base) MCG/ACT inhaler Inhale 2 puffs into the  lungs See admin instructions. Every 4-6 hours prn 18 g 2   No current facility-administered medications for this visit.   Vitals:   01/23/24 1046  BP: 134/86  Pulse: 78  Resp: 18  SpO2: 96%   Review of Systems  Respiratory: Negative.  Negative for cough, shortness of breath and wheezing.   Cardiovascular:  Positive for chest pain. Negative for leg swelling.       Along right chest incision sites  Musculoskeletal:  Positive for joint pain.       Left shoulder pain    Physical Exam Constitutional:      Appearance: Normal appearance.  HENT:     Head: Normocephalic and atraumatic.  Cardiovascular:     Rate and Rhythm: Normal rate and regular rhythm.     Heart sounds: Normal heart sounds, S1 normal and S2 normal.  Pulmonary:     Effort: Pulmonary effort is normal.     Breath sounds: Normal breath sounds.  Skin:    General: Skin is warm and dry.      Neurological:     General: No focal deficit present.     Mental Status: He is alert and oriented to person, place, and time.     Diagnostic Tests: CLINICAL DATA:  Pneumothorax   EXAM: CHEST - 2 VIEW   COMPARISON:  Chest CT 01/13/2024.  Chest x-ray 01/15/2024   FINDINGS: Heart and mediastinal contours are stable. Lucency again noted over the right apex, likely air within the previous chest tube tract. Lucency could also reflect the previously seen loculated right apical hydropneumothorax. Scarring in the right mid and lower lung. No confluent opacity or effusion on the left. No acute bony abnormality.   IMPRESSION: Tubular lucency again noted projecting over the right apex could again reflect air within the previous chest tube tract or residual loculated right hydropneumothorax.     Electronically Signed   By: Franky Crease M.D.   On: 01/23/2024 13:19  Plan: Recurrent spontaneous pneumothorax -We reviewed today's chest x ray.  He is to finish all Augmentin  as prescribed.  Continue to increase exercise as  tolerated.  He should not lift anything over 10 pounds until a full 6 weeks from surgery.  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  -Continue to use inhalers for COPD as prescribed.  Alarm symptoms discussed and patient states understanding of when to follow-up with the hospital.  His main symptom of pneumothorax is shortness of breath that is not resolved with use of inhalers. - Keep surgical sites clean and dry.  Removed suture today from chest tube site. - Continue follow-up for chronic conditions with primary care provider.  He has an appointment with PCP on 01/28/2024.  Patient is  a follow-up in 3 months with chest x-ray   Manuelita CHRISTELLA Rough, PA-C [facility] [phone]

## 2024-01-23 ENCOUNTER — Ambulatory Visit

## 2024-01-23 ENCOUNTER — Ambulatory Visit (HOSPITAL_COMMUNITY)
Admission: RE | Admit: 2024-01-23 | Discharge: 2024-01-23 | Disposition: A | Discharge status: Home / Self Care | Source: Ambulatory Visit | Attending: Cardiovascular Disease | Admitting: Cardiovascular Disease

## 2024-01-23 VITALS — BP 134/86 | HR 78 | Resp 18 | Ht 70.0 in | Wt 164.0 lb

## 2024-01-23 DIAGNOSIS — J9383 Other pneumothorax: Secondary | ICD-10-CM

## 2024-01-23 DIAGNOSIS — J939 Pneumothorax, unspecified: Secondary | ICD-10-CM | POA: Diagnosis not present

## 2024-01-23 DIAGNOSIS — Z09 Encounter for follow-up examination after completed treatment for conditions other than malignant neoplasm: Secondary | ICD-10-CM

## 2024-01-23 NOTE — Patient Instructions (Signed)
-  Follow up in 3 months with clinic with chest xray -Follow up with your PCP for continued management of chronic conditions

## 2024-01-25 ENCOUNTER — Other Ambulatory Visit: Payer: Self-pay | Admitting: *Deleted

## 2024-01-25 MED ORDER — TRAMADOL HCL 50 MG PO TABS: 50.0000 mg | ORAL_TABLET | Freq: Four times a day (QID) | ORAL | 0 refills | Status: AC | PRN

## 2024-01-25 NOTE — Progress Notes (Signed)
 Patient contacted the office requesting a refill of oxycodone . States chest has become more sore as the week has progressed. States he is taking oxycodone  every 6 hours. Patient also states he is taking Gabapentin  that has been prescribed by another provider. Per L. Fenyak, PA, tramadol  called into patients preferred pharmacy. Advised this will be the last refill of pain medication. Patient advised to continue to use OTC pain relief for pain management. Nothing further at this time.

## 2024-01-28 ENCOUNTER — Ambulatory Visit (INDEPENDENT_AMBULATORY_CARE_PROVIDER_SITE_OTHER)

## 2024-01-28 ENCOUNTER — Encounter: Payer: Self-pay | Admitting: Family

## 2024-01-28 ENCOUNTER — Ambulatory Visit (INDEPENDENT_AMBULATORY_CARE_PROVIDER_SITE_OTHER): Admitting: Family

## 2024-01-28 VITALS — BP 119/80 | HR 88 | Temp 98.1°F | Resp 16 | Ht 70.0 in | Wt 157.2 lb

## 2024-01-28 DIAGNOSIS — J449 Chronic obstructive pulmonary disease, unspecified: Secondary | ICD-10-CM

## 2024-01-28 DIAGNOSIS — D72829 Elevated white blood cell count, unspecified: Secondary | ICD-10-CM

## 2024-01-28 DIAGNOSIS — M109 Gout, unspecified: Secondary | ICD-10-CM | POA: Diagnosis not present

## 2024-01-28 DIAGNOSIS — J9383 Other pneumothorax: Secondary | ICD-10-CM

## 2024-01-28 DIAGNOSIS — E43 Unspecified severe protein-calorie malnutrition: Secondary | ICD-10-CM

## 2024-01-28 DIAGNOSIS — M19012 Primary osteoarthritis, left shoulder: Secondary | ICD-10-CM | POA: Diagnosis not present

## 2024-01-28 DIAGNOSIS — M25512 Pain in left shoulder: Secondary | ICD-10-CM

## 2024-01-28 DIAGNOSIS — Z09 Encounter for follow-up examination after completed treatment for conditions other than malignant neoplasm: Secondary | ICD-10-CM

## 2024-01-28 DIAGNOSIS — J189 Pneumonia, unspecified organism: Secondary | ICD-10-CM

## 2024-01-28 DIAGNOSIS — Z720 Tobacco use: Secondary | ICD-10-CM

## 2024-01-28 DIAGNOSIS — G629 Polyneuropathy, unspecified: Secondary | ICD-10-CM

## 2024-01-28 DIAGNOSIS — N179 Acute kidney failure, unspecified: Secondary | ICD-10-CM | POA: Diagnosis not present

## 2024-01-28 DIAGNOSIS — I10 Essential (primary) hypertension: Secondary | ICD-10-CM | POA: Diagnosis not present

## 2024-01-28 DIAGNOSIS — J948 Other specified pleural conditions: Secondary | ICD-10-CM | POA: Diagnosis not present

## 2024-01-28 MED ORDER — GABAPENTIN 300 MG PO CAPS: 300.0000 mg | ORAL_CAPSULE | Freq: Three times a day (TID) | ORAL | 2 refills | Status: DC

## 2024-01-28 NOTE — Progress Notes (Signed)
 Patient ID: [name], male    DOB: Jul 09, 1975  MRN: [id number]  CC: Hospital Discharge Follow-Up  Subjective: Albert Hill is a 48 y.o. male who presents for hospital discharge follow-up.   His concerns today include:  Patient seen on 12/29/2023 - 01/15/2024 (17 days) at [facility] for recurrent pneumothorax, bibasilar pneumonia, COPD and additional diagnoses. Today patient reports feeling overall improved except for left shoulder pain. He denies recent trauma/injury and red flag symptoms. States thinks left shoulder related to he during anesthesia recovery he was trying to pull the tubes out and they had to tie him down to the bed. States he has been taking Gabapentin  300 mg three times daily for years to help with gout and neuropathy. States he didn't realize Gabapentin  prescription was written for 300 mg daily and needs refills of the three times daily dose. Otherwise he is doing well on medication regimen. He was recently seen on 01/23/2024 at [facility] for hospital discharge follow-up. States he is using dip to keep from smoking. States [facility] has been to his home.  Patient Active Problem List   Diagnosis Date Noted   Protein-calorie malnutrition, severe 01/03/2024   Recurrent spontaneous pneumothorax 12/19/2023   Non-recurrent bilateral inguinal hernia without obstruction or gangrene    S/P lumbar discectomy 09/16/2021   Asthma 07/22/2021   Nausea 12/23/2020   Abdominal pain, epigastric 12/23/2020   Loss of weight 12/23/2020   Colon cancer screening 12/23/2020   Gout of ankle 04/11/2018   Penile lesion 04/11/2018   Essential hypertension 01/31/2018     Current Outpatient Medications on File Prior to Visit  Medication Sig Dispense Refill   acetaminophen  (TYLENOL ) 500 MG tablet Take 1 tablet (500 mg total) by mouth every 6 (six) hours as needed for mild pain (pain score 1-3) or  moderate pain (pain score 4-6). 20 tablet 0   albuterol  (PROVENTIL ) (2.5 MG/3ML) 0.083% nebulizer solution Take 2.5 mg by nebulization every 6 (six) hours as needed for shortness of breath or wheezing.     allopurinol  (ZYLOPRIM ) 300 MG tablet Take 1 tablet (300 mg total) by mouth daily. 90 tablet 0   nicotine  (NICODERM CQ  - DOSED IN MG/24 HOURS) 21 mg/24hr patch Place 1 patch (21 mg total) onto the skin daily.     omeprazole  (PRILOSEC) 20 MG capsule Take 1 capsule (20 mg total) by mouth daily. 90 capsule 0   senna-docusate (SENOKOT-S) 8.6-50 MG tablet Take 1-2 tablets by mouth 2 (two) times daily between meals as needed for mild constipation or moderate constipation.     sildenafil  (VIAGRA ) 100 MG tablet Take 1 tablet 1/2 hour to 1 hour prior to intercourse as needed. Limit use to 1/2 tablet or 1 tablet per 24 hours. 90 tablet 0   traMADol  (ULTRAM ) 50 MG tablet Take 1 tablet (50 mg total) by mouth every 6 (six) hours as needed for up to 5 days. 20 tablet 0   traZODone  (DESYREL ) 50 MG tablet Take 0.5-1 tablets (25-50 mg total) by mouth at bedtime as needed for sleep. 90 tablet 0   umeclidinium-vilanterol (ANORO ELLIPTA ) 62.5-25 MCG/ACT AEPB Inhale 1 puff into the lungs daily. 60 each 0   VENTOLIN  HFA 108 (90 Base) MCG/ACT inhaler Inhale 2 puffs into the lungs See admin instructions. Every 4-6 hours prn 18 g 2   amoxicillin -clavulanate (AUGMENTIN ) 875-125 MG tablet Take 1 tablet by mouth  every 12 (twelve) hours for 13 days. (Patient not taking: Reported on 01/28/2024) 26 tablet 0   No current facility-administered medications on file prior to visit.    No Known Allergies  Social History   Socioeconomic History   Marital status: Significant Other    Spouse name: Not on file   Number of children: 1   Years of education: Not on file   Highest education level: Not on file  Occupational History   Occupation: textiles  Tobacco Use   Smoking status: Every Day    Current packs/day: 2.00     Average packs/day: 2.0 packs/day for 30.0 years (60.0 ttl pk-yrs)    Types: Cigarettes    Passive exposure: Current   Smokeless tobacco: Current    Types: Chew   Tobacco comments:    Smokes a ppd.  Wants to quit.  08/10/2021.  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   Sexual activity: Not Currently  Other Topics Concern   Not on file  Social History Narrative   Not on file   Social Drivers of Health   Financial Resource Strain: High Risk (12/24/2023)   Overall Financial Resource Strain (CARDIA)    Difficulty of Paying Living Expenses: Very hard  Food Insecurity: No Food Insecurity (12/30/2023)   Hunger Vital Sign    Worried About Running Out of Food in the Last Year: Never true    Ran Out of Food in the Last Year: Never true  Recent Concern: Food Insecurity - Food Insecurity Present (12/24/2023)   Hunger Vital Sign    Worried About Running Out of Food in the Last Year: Sometimes true    Ran Out of Food in the Last Year: Sometimes true  Transportation Needs: No Transportation Needs (12/30/2023)   PRAPARE - Administrator, Civil Service (Medical): No    Lack of Transportation (Non-Medical): No  Recent Concern: Transportation Needs - Unmet Transportation Needs (12/24/2023)   PRAPARE - Transportation    Lack of Transportation (Medical): Yes    Lack of Transportation (Non-Medical): Yes  Physical Activity: Inactive (12/24/2023)   Exercise Vital Sign    Days of Exercise per Week: 0 days    Minutes of Exercise per Session: 0 min  Stress: Stress Concern Present (12/24/2023)   [employer] of Occupational Health - Occupational Stress Questionnaire    Feeling of Stress: Rather much  Social Connections: Moderately Integrated (12/24/2023)   Social Connection and Isolation Panel     Frequency of Communication with Friends and Family: Three times a week    Frequency of Social Gatherings with Friends and Family: Once a week    Attends Religious Services: 1 to 4 times per year    Active Member of [employer] or Organizations: No    Attends Banker Meetings: Never    Marital Status: Living with partner  Intimate Partner Violence: Not At Risk (12/30/2023)   Humiliation, Afraid, Rape, and Kick questionnaire    Fear of Current or Ex-Partner: No    Emotionally Abused: No    Physically Abused: No    Sexually Abused: No    Family History  Problem Relation Age of Onset   Lung cancer Father    Asthma Son  Colon cancer Neg Hx    Esophageal cancer Neg Hx    Rectal cancer Neg Hx    Stomach cancer Neg Hx     Past Surgical History:  Procedure Laterality Date   CYST EXCISION     Patient states he was 48 years old, on his back   INTERCOSTAL NERVE BLOCK Right 12/31/2023   Procedure: BLOCK, NERVE, INTERCOSTAL;  Surgeon: Shyrl Linnie KIDD, MD;  Location: [facility] OR;  Service: Open Heart Surgery;  Laterality: Right;   LUMBAR LAMINECTOMY/DECOMPRESSION MICRODISCECTOMY Left 09/16/2021   Procedure: LEFT L5-S1 MICRODISCECTOMY;  Surgeon: Clois Fret, MD;  Location: [facility] ORS;  Service: Neurosurgery;  Laterality: Left;   THORACOSCOPY, ROBOT-ASSISTED Right 12/31/2023   Procedure: ROBOTIC ASSISTED RIGHT VIDEO ASSISTED THORACOSCOPIC SURGERY, BLEB RESECTION, PLEURAL ABRASION;  Surgeon: Shyrl Linnie KIDD, MD;  Location: [facility] OR;  Service: Open Heart Surgery;  Laterality: Right;   UMBILICAL HERNIA REPAIR N/A 07/26/2018   Procedure: OPEN HERNIA REPAIR UMBILICAL ADULT WITH MESH;  Surgeon: Nicholaus Selinda Birmingham, MD;  Location: [facility] ORS;  Service: General;  Laterality: N/A;   XI ROBOTIC ASSISTED INGUINAL HERNIA REPAIR WITH MESH Bilateral 01/31/2022   Procedure: XI ROBOTIC ASSISTED INGUINAL HERNIA REPAIR WITH MESH;  Surgeon: Desiderio Schanz, MD;  Location: [facility] ORS;  Service: General;   Laterality: Bilateral;    ROS: Review of Systems Negative except as stated above  PHYSICAL EXAM: BP 119/80   Pulse 88   Temp 98.1 F (36.7 C) (Oral)   Resp 16   Ht 5' 10 (1.778 m)   Wt 157 lb 3.2 oz (71.3 kg)   SpO2 96%   BMI 22.56 kg/m   Physical Exam HENT:     Head: Normocephalic and atraumatic.     Nose: Nose normal.     Mouth/Throat:     Mouth: Mucous membranes are moist.     Pharynx: Oropharynx is clear.  Eyes:     Extraocular Movements: Extraocular movements intact.     Conjunctiva/sclera: Conjunctivae normal.     Pupils: Pupils are equal, round, and reactive to light.  Cardiovascular:     Rate and Rhythm: Normal rate and regular rhythm.     Pulses: Normal pulses.     Heart sounds: Normal heart sounds.  Pulmonary:     Effort: Pulmonary effort is normal.     Breath sounds: Normal breath sounds.  Musculoskeletal:        General: Normal range of motion.     Right shoulder: Normal.     Left shoulder: Normal.     Right upper arm: Normal.     Left upper arm: Normal.     Right elbow: Normal.     Left elbow: Normal.     Right forearm: Normal.     Left forearm: Normal.     Right wrist: Normal.     Left wrist: Normal.     Right hand: Normal.     Left hand: Normal.     Cervical back: Normal range of motion and neck supple.  Neurological:     General: No focal deficit present.     Mental Status: He is alert and oriented to person, place, and time.  Psychiatric:        Mood and Affect: Mood normal.        Behavior: Behavior normal.     ASSESSMENT AND PLAN: 1. Hospital discharge follow-up (Primary) - Reviewed hospital course, current medications, ensured proper follow-up in place, and addressed concerns.  - Routine screening.  -  CMP14+EGFR - CBC  2. Recurrent spontaneous pneumothorax 3. Pneumonia due to infectious organism, unspecified laterality, unspecified part of lung 4. Hydropneumothorax 5. Chronic obstructive pulmonary disease, unspecified COPD  type (HCC) - Patient today in office with no cardiopulmonary/acute distress.  - Continue present management.  - Keep all scheduled appointments with Cardiothoracic Surgery.   6. Primary hypertension - Continue present management.  - Counseled on blood pressure goal of less than 130/80, low-sodium, DASH diet, medication compliance, and 150 minutes of moderate intensity exercise per week as tolerated. Counseled on medication adherence and adverse effects. - Follow-up with primary provider in 3 months or sooner if needed.   7. Tobacco abuse - Counseled to quit.    8. AKI (acute kidney injury) (HCC) - Routine screening (see #1).  9. Leukocytosis, unspecified type - Routine screening (see #1).  10. Severe malnutrition (HCC) - Referral to Medical Weight Management for evaluation/management. - Amb Ref to Medical Weight Management  11. Gout, unspecified cause, unspecified chronicity, unspecified site 12. Neuropathy - Continue Gabapentin  as prescribed. Counseled on medication adherence/adverse effects.  - Follow-up with primary provider as scheduled. - gabapentin  (NEURONTIN ) 300 MG capsule; Take 1 capsule (300 mg total) by mouth 3 (three) times daily.  Dispense: 90 capsule; Refill: 2  13. Left shoulder pain, unspecified chronicity - Diagnostic xray left shoulder for evaluation. - DG Shoulder Left; Future   Patient was given the opportunity to ask questions.  Patient verbalized understanding of the plan and was able to repeat key elements of the plan. Patient was given clear instructions to go to Emergency Department or return to medical center if symptoms don't improve, worsen, or new problems develop.The patient verbalized understanding.   Orders Placed This Encounter  Procedures   DG Shoulder Left   CMP14+EGFR   CBC   Amb Ref to Medical Weight Management     Requested Prescriptions   Signed Prescriptions Disp Refills   gabapentin  (NEURONTIN ) 300 MG capsule 90 capsule 2     Sig: Take 1 capsule (300 mg total) by mouth 3 (three) times daily.    Follow-up with primary provider as scheduled.  Greig JINNY Drones, NP

## 2024-01-28 NOTE — Progress Notes (Signed)
 Left shoulder pain.

## 2024-01-28 NOTE — Progress Notes (Signed)
 Patient ID: Albert Hill, male    DOB: Jul 09, 1975  MRN: [id number]  CC: Hospital Discharge Follow-Up  Subjective: Albert Hill is a 48 y.o. male who presents for hospital discharge follow-up.   His concerns today include:  Patient seen on 12/29/2023 - 01/15/2024 (17 days) at [facility] for recurrent pneumothorax, bibasilar pneumonia, COPD and additional diagnoses. Today patient reports feeling overall improved except for left shoulder pain. He denies recent trauma/injury and red flag symptoms. States thinks left shoulder related to he during anesthesia recovery he was trying to pull the tubes out and they had to tie him down to the bed. States he has been taking Gabapentin  300 mg three times daily for years to help with gout and neuropathy. States he didn't realize Gabapentin  prescription was written for 300 mg daily and needs refills of the three times daily dose. Otherwise he is doing well on medication regimen. He was recently seen on 01/23/2024 at [facility] for hospital discharge follow-up. States he is using dip to keep from smoking. States [facility] has been to his home.  Patient Active Problem List   Diagnosis Date Noted   Protein-calorie malnutrition, severe 01/03/2024   Recurrent spontaneous pneumothorax 12/19/2023   Non-recurrent bilateral inguinal hernia without obstruction or gangrene    S/P lumbar discectomy 09/16/2021   Asthma 07/22/2021   Nausea 12/23/2020   Abdominal pain, epigastric 12/23/2020   Loss of weight 12/23/2020   Colon cancer screening 12/23/2020   Gout of ankle 04/11/2018   Penile lesion 04/11/2018   Essential hypertension 01/31/2018     Current Outpatient Medications on File Prior to Visit  Medication Sig Dispense Refill   acetaminophen  (TYLENOL ) 500 MG tablet Take 1 tablet (500 mg total) by mouth every 6 (six) hours as needed for mild pain (pain score 1-3) or  moderate pain (pain score 4-6). 20 tablet 0   albuterol  (PROVENTIL ) (2.5 MG/3ML) 0.083% nebulizer solution Take 2.5 mg by nebulization every 6 (six) hours as needed for shortness of breath or wheezing.     allopurinol  (ZYLOPRIM ) 300 MG tablet Take 1 tablet (300 mg total) by mouth daily. 90 tablet 0   nicotine  (NICODERM CQ  - DOSED IN MG/24 HOURS) 21 mg/24hr patch Place 1 patch (21 mg total) onto the skin daily.     omeprazole  (PRILOSEC) 20 MG capsule Take 1 capsule (20 mg total) by mouth daily. 90 capsule 0   senna-docusate (SENOKOT-S) 8.6-50 MG tablet Take 1-2 tablets by mouth 2 (two) times daily between meals as needed for mild constipation or moderate constipation.     sildenafil  (VIAGRA ) 100 MG tablet Take 1 tablet 1/2 hour to 1 hour prior to intercourse as needed. Limit use to 1/2 tablet or 1 tablet per 24 hours. 90 tablet 0   traMADol  (ULTRAM ) 50 MG tablet Take 1 tablet (50 mg total) by mouth every 6 (six) hours as needed for up to 5 days. 20 tablet 0   traZODone  (DESYREL ) 50 MG tablet Take 0.5-1 tablets (25-50 mg total) by mouth at bedtime as needed for sleep. 90 tablet 0   umeclidinium-vilanterol (ANORO ELLIPTA ) 62.5-25 MCG/ACT AEPB Inhale 1 puff into the lungs daily. 60 each 0   VENTOLIN  HFA 108 (90 Base) MCG/ACT inhaler Inhale 2 puffs into the lungs See admin instructions. Every 4-6 hours prn 18 g 2   amoxicillin -clavulanate (AUGMENTIN ) 875-125 MG tablet Take 1 tablet by mouth  every 12 (twelve) hours for 13 days. (Patient not taking: Reported on 01/28/2024) 26 tablet 0   No current facility-administered medications on file prior to visit.    No Known Allergies  Social History   Socioeconomic History   Marital status: Significant Other    Spouse name: Not on file   Number of children: 1   Years of education: Not on file   Highest education level: Not on file  Occupational History   Occupation: textiles  Tobacco Use   Smoking status: Every Day    Current packs/day: 2.00     Average packs/day: 2.0 packs/day for 30.0 years (60.0 ttl pk-yrs)    Types: Cigarettes    Passive exposure: Current   Smokeless tobacco: Current    Types: Chew   Tobacco comments:    Smokes a ppd.  Wants to quit.  08/10/2021.  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   Sexual activity: Not Currently  Other Topics Concern   Not on file  Social History Narrative   Not on file   Social Drivers of Health   Financial Resource Strain: High Risk (12/24/2023)   Overall Financial Resource Strain (CARDIA)    Difficulty of Paying Living Expenses: Very hard  Food Insecurity: No Food Insecurity (12/30/2023)   Hunger Vital Sign    Worried About Running Out of Food in the Last Year: Never true    Ran Out of Food in the Last Year: Never true  Recent Concern: Food Insecurity - Food Insecurity Present (12/24/2023)   Hunger Vital Sign    Worried About Running Out of Food in the Last Year: Sometimes true    Ran Out of Food in the Last Year: Sometimes true  Transportation Needs: No Transportation Needs (12/30/2023)   PRAPARE - Administrator, Civil Service (Medical): No    Lack of Transportation (Non-Medical): No  Recent Concern: Transportation Needs - Unmet Transportation Needs (12/24/2023)   PRAPARE - Transportation    Lack of Transportation (Medical): Yes    Lack of Transportation (Non-Medical): Yes  Physical Activity: Inactive (12/24/2023)   Exercise Vital Sign    Days of Exercise per Week: 0 days    Minutes of Exercise per Session: 0 min  Stress: Stress Concern Present (12/24/2023)   [employer] of Occupational Health - Occupational Stress Questionnaire    Feeling of Stress: Rather much  Social Connections: Moderately Integrated (12/24/2023)   Social Connection and Isolation Panel     Frequency of Communication with Friends and Family: Three times a week    Frequency of Social Gatherings with Friends and Family: Once a week    Attends Religious Services: 1 to 4 times per year    Active Member of [employer] or Organizations: No    Attends Banker Meetings: Never    Marital Status: Living with partner  Intimate Partner Violence: Not At Risk (12/30/2023)   Humiliation, Afraid, Rape, and Kick questionnaire    Fear of Current or Ex-Partner: No    Emotionally Abused: No    Physically Abused: No    Sexually Abused: No    Family History  Problem Relation Age of Onset   Lung cancer Father    Asthma Son  Colon cancer Neg Hx    Esophageal cancer Neg Hx    Rectal cancer Neg Hx    Stomach cancer Neg Hx     Past Surgical History:  Procedure Laterality Date   CYST EXCISION     Patient states he was 48 years old, on his back   INTERCOSTAL NERVE BLOCK Right 12/31/2023   Procedure: BLOCK, NERVE, INTERCOSTAL;  Surgeon: Shyrl Linnie KIDD, MD;  Location: [facility] OR;  Service: Open Heart Surgery;  Laterality: Right;   LUMBAR LAMINECTOMY/DECOMPRESSION MICRODISCECTOMY Left 09/16/2021   Procedure: LEFT L5-S1 MICRODISCECTOMY;  Surgeon: Clois Fret, MD;  Location: [facility] ORS;  Service: Neurosurgery;  Laterality: Left;   THORACOSCOPY, ROBOT-ASSISTED Right 12/31/2023   Procedure: ROBOTIC ASSISTED RIGHT VIDEO ASSISTED THORACOSCOPIC SURGERY, BLEB RESECTION, PLEURAL ABRASION;  Surgeon: Shyrl Linnie KIDD, MD;  Location: [facility] OR;  Service: Open Heart Surgery;  Laterality: Right;   UMBILICAL HERNIA REPAIR N/A 07/26/2018   Procedure: OPEN HERNIA REPAIR UMBILICAL ADULT WITH MESH;  Surgeon: Nicholaus Selinda Birmingham, MD;  Location: [facility] ORS;  Service: General;  Laterality: N/A;   XI ROBOTIC ASSISTED INGUINAL HERNIA REPAIR WITH MESH Bilateral 01/31/2022   Procedure: XI ROBOTIC ASSISTED INGUINAL HERNIA REPAIR WITH MESH;  Surgeon: Desiderio Schanz, MD;  Location: [facility] ORS;  Service: General;   Laterality: Bilateral;    ROS: Review of Systems Negative except as stated above  PHYSICAL EXAM: BP 119/80   Pulse 88   Temp 98.1 F (36.7 C) (Oral)   Resp 16   Ht 5' 10 (1.778 m)   Wt 157 lb 3.2 oz (71.3 kg)   SpO2 96%   BMI 22.56 kg/m   Physical Exam HENT:     Head: Normocephalic and atraumatic.     Nose: Nose normal.     Mouth/Throat:     Mouth: Mucous membranes are moist.     Pharynx: Oropharynx is clear.  Eyes:     Extraocular Movements: Extraocular movements intact.     Conjunctiva/sclera: Conjunctivae normal.     Pupils: Pupils are equal, round, and reactive to light.  Cardiovascular:     Rate and Rhythm: Normal rate and regular rhythm.     Pulses: Normal pulses.     Heart sounds: Normal heart sounds.  Pulmonary:     Effort: Pulmonary effort is normal.     Breath sounds: Normal breath sounds.  Musculoskeletal:        General: Normal range of motion.     Right shoulder: Normal.     Left shoulder: Normal.     Right upper arm: Normal.     Left upper arm: Normal.     Right elbow: Normal.     Left elbow: Normal.     Right forearm: Normal.     Left forearm: Normal.     Right wrist: Normal.     Left wrist: Normal.     Right hand: Normal.     Left hand: Normal.     Cervical back: Normal range of motion and neck supple.  Neurological:     General: No focal deficit present.     Mental Status: He is alert and oriented to person, place, and time.  Psychiatric:        Mood and Affect: Mood normal.        Behavior: Behavior normal.     ASSESSMENT AND PLAN: 1. Hospital discharge follow-up (Primary) - Reviewed hospital course, current medications, ensured proper follow-up in place, and addressed concerns.  - Routine screening.  -  CMP14+EGFR - CBC  2. Recurrent spontaneous pneumothorax 3. Pneumonia due to infectious organism, unspecified laterality, unspecified part of lung 4. Hydropneumothorax 5. Chronic obstructive pulmonary disease, unspecified COPD  type (HCC) - Patient today in office with no cardiopulmonary/acute distress.  - Continue present management.  - Keep all scheduled appointments with Cardiothoracic Surgery.   6. Primary hypertension - Continue present management.  - Counseled on blood pressure goal of less than 130/80, low-sodium, DASH diet, medication compliance, and 150 minutes of moderate intensity exercise per week as tolerated. Counseled on medication adherence and adverse effects. - Follow-up with primary provider in 3 months or sooner if needed.   7. Tobacco abuse - Counseled to quit.    8. AKI (acute kidney injury) (HCC) - Routine screening (see #1).  9. Leukocytosis, unspecified type - Routine screening (see #1).  10. Severe malnutrition (HCC) - Referral to Medical Weight Management for evaluation/management. - Amb Ref to Medical Weight Management  11. Gout, unspecified cause, unspecified chronicity, unspecified site 12. Neuropathy - Continue Gabapentin  as prescribed. Counseled on medication adherence/adverse effects.  - Follow-up with primary provider as scheduled. - gabapentin  (NEURONTIN ) 300 MG capsule; Take 1 capsule (300 mg total) by mouth 3 (three) times daily.  Dispense: 90 capsule; Refill: 2  13. Left shoulder pain, unspecified chronicity - Diagnostic xray left shoulder for evaluation. - DG Shoulder Left; Future   Patient was given the opportunity to ask questions.  Patient verbalized understanding of the plan and was able to repeat key elements of the plan. Patient was given clear instructions to go to Emergency Department or return to medical center if symptoms don't improve, worsen, or new problems develop.The patient verbalized understanding.   Orders Placed This Encounter  Procedures   DG Shoulder Left   CMP14+EGFR   CBC   Amb Ref to Medical Weight Management     Requested Prescriptions   Signed Prescriptions Disp Refills   gabapentin  (NEURONTIN ) 300 MG capsule 90 capsule 2     Sig: Take 1 capsule (300 mg total) by mouth 3 (three) times daily.    Follow-up with primary provider as scheduled.  Greig JINNY Drones, NP

## 2024-01-29 DIAGNOSIS — Z419 Encounter for procedure for purposes other than remedying health state, unspecified: Secondary | ICD-10-CM | POA: Diagnosis not present

## 2024-01-29 LAB — CMP14+EGFR
ALT: 9 IU/L (ref 0–44)
AST: 14 IU/L (ref 0–40)
Albumin: 3.9 g/dL — ABNORMAL LOW (ref 4.1–5.1)
Alkaline Phosphatase: 101 IU/L (ref 44–121)
BUN/Creatinine Ratio: 30 — ABNORMAL HIGH (ref 9–20)
BUN: 28 mg/dL — ABNORMAL HIGH (ref 6–24)
Bilirubin Total: <0.2 mg/dL (ref 0.0–1.2)
CO2: 18 mmol/L — ABNORMAL LOW (ref 20–29)
Calcium: 9.5 mg/dL (ref 8.7–10.2)
Chloride: 101 mmol/L (ref 96–106)
Creatinine, Ser: 0.93 mg/dL (ref 0.76–1.27)
Globulin, Total: 3.4 g/dL (ref 1.5–4.5)
Glucose: 79 mg/dL (ref 70–99)
Potassium: 5.3 mmol/L — ABNORMAL HIGH (ref 3.5–5.2)
Sodium: 135 mmol/L (ref 134–144)
Total Protein: 7.3 g/dL (ref 6.0–8.5)
eGFR: 101 mL/min/1.73 (ref >59)

## 2024-01-29 LAB — CBC
Hematocrit: 34.6 % — ABNORMAL LOW (ref 37.5–51.0)
Hemoglobin: 11.3 g/dL — ABNORMAL LOW (ref 13.0–17.7)
MCH: 30.2 pg (ref 26.6–33.0)
MCHC: 32.7 g/dL (ref 31.5–35.7)
MCV: 93 fL (ref 79–97)
Platelets: 418 x10E3/uL (ref 150–450)
RBC: 3.74 x10E6/uL — ABNORMAL LOW (ref 4.14–5.80)
RDW: 13.3 % (ref 11.6–15.4)
WBC: 8.3 x10E3/uL (ref 3.4–10.8)

## 2024-01-30 ENCOUNTER — Telehealth: Payer: Self-pay | Admitting: *Deleted

## 2024-01-30 ENCOUNTER — Ambulatory Visit: Payer: Self-pay | Admitting: Family

## 2024-01-30 DIAGNOSIS — M25512 Pain in left shoulder: Secondary | ICD-10-CM

## 2024-01-30 NOTE — Telephone Encounter (Signed)
 Patient called requesting a refill of tramadol . States right shoulder, arm, and side continue to hurt. States Tylenol  and Ibuprofen  do not help. Discussed further refills with PA. Advised patient no further refills will be prescribed at this time. Advised to continue to take gabapentin  as prescribed by PCP. Patient verbalized understanding.

## 2024-02-05 ENCOUNTER — Telehealth: Payer: Self-pay | Admitting: Family

## 2024-02-05 NOTE — Telephone Encounter (Signed)
 Pt is wanting CMA to call him for x-ray results; please advise

## 2024-02-06 NOTE — Telephone Encounter (Signed)
 I returned patient call with results no one answered and I was unable to leave a voicemail

## 2024-02-08 ENCOUNTER — Encounter: Payer: Self-pay | Admitting: Surgical

## 2024-02-08 ENCOUNTER — Ambulatory Visit (INDEPENDENT_AMBULATORY_CARE_PROVIDER_SITE_OTHER): Admitting: Surgical

## 2024-02-08 ENCOUNTER — Other Ambulatory Visit (INDEPENDENT_AMBULATORY_CARE_PROVIDER_SITE_OTHER): Payer: Self-pay

## 2024-02-08 DIAGNOSIS — M25512 Pain in left shoulder: Secondary | ICD-10-CM

## 2024-02-08 DIAGNOSIS — M542 Cervicalgia: Secondary | ICD-10-CM | POA: Diagnosis not present

## 2024-02-08 DIAGNOSIS — M5412 Radiculopathy, cervical region: Secondary | ICD-10-CM | POA: Diagnosis not present

## 2024-02-08 NOTE — Progress Notes (Signed)
 Office Visit Note   Patient: Albert Hill           Date of Birth: 1976-02-21           MRN: [id number] Visit Date: 02/08/2024 Requested by: Lorren Greig PARAS, NP [address] [place],  [place] [address] PCP: Lorren Greig PARAS, NP  Subjective: Chief Complaint  Patient presents with   Neck - Pain   Left Shoulder - Pain    HPI: Albert Hill is a 48 y.o. male who presents to the office reporting left shoulder pain.  He had spontaneous right pneumothorax that developed on 12/19/2023.  He was hospitalized on 12/29/2023 and had underwent procedure with Dr. Shyrl on 7/14 consisting of right thoracoscopy, wedge resection, apical pleurectomy, intercostal nerve block.  Chest tube was placed at that time.  He was hospitalized and discharged on 01/15/2024.  Did not notice any left shoulder pain until he arrived home from the hospital.  Since then he describes diffuse left shoulder pain that radiates into the left side of his neck and down the left arm to the elbow.  He has tingling and numbness that extends down to the elbow as well.  Describes a deep aching sensation.  Pain is getting worse and worse.  No history of prior issue with this shoulder.  Left arm feels weak.  He is right-hand dominant.  Taking Tylenol  ibuprofen  as well as using lidocaine  patches without relief..  No radicular symptoms past the elbow.  No history of shoulder surgery.  Currently not working              ROS: All systems reviewed are negative as they relate to the chief complaint within the history of present illness.  Patient denies fevers or chills.  Assessment & Plan: Visit Diagnoses:  1. Radiculopathy, cervical region   2. Neck pain   3. Acute pain of left shoulder     Plan: Impression is a 48 year old male with left shoulder pain with pain that has been present since he got home from recent hospitalization.  Denies any history of injury.  States that he just woke up 1 day and had shoulder pain  that has progressively worsened and is quite severe now.  He also has associated weakness.  On exam, he does have pain that is reproduced with cervical spine range of motion and positive Lhermitte sign but he also has pain reproduced with shoulder range of motion as well as tenderness primarily over the bicipital groove.  He has external rotation weakness of the left shoulder that is quite profound compared with the contralateral side but no bicep flexion or supination weakness.  Plan today is left shoulder subacromial diagnostic injection.  This was administered.  This gave him about 30 to 40% improvement after reexamination.  He also had ultrasound evaluation of the left shoulder demonstrating hypoechoic signal with at least high-grade partial tear of the infraspinatus noted; in addition to this, glenohumeral joint effusion was noted with hypoechoic halo noted around the bicep tendon in short axis.  Based on this, would recommend MRI arthrogram of the left shoulder for further evaluation of rotator cuff tear.  Also would recommend MRI of the cervical spine given reproducible pain with cervical spine range of motion positive Lhermitte sign as well as the somewhat limited relief of his pain with subacromial lidocaine  injection.  Follow-up after both MRIs to review results.  Steroid Dosepak sent in for symptomatic relief.  Follow-Up Instructions: No follow-ups on  file.   Orders:  No orders of the defined types were placed in this encounter.  No orders of the defined types were placed in this encounter.     Procedures: Large Joint Inj: L subacromial bursa on 02/08/2024 4:26 PM Indications: diagnostic evaluation Details: 25 G 1.5 in needle, posterior approach  Arthrogram: No  Medications: 10 mL lidocaine  1 % Outcome: tolerated well, no immediate complications Procedure, treatment alternatives, risks and benefits explained, specific risks discussed. Consent was given by the patient. Immediately  prior to procedure a time out was called to verify the correct patient, procedure, equipment, support staff and site/side marked as required. Patient was prepped and draped in the usual sterile fashion.       Clinical Data: No additional findings.  Objective: Vital Signs: There were no vitals taken for this visit.  Physical Exam:  Constitutional: Patient appears well-developed HEENT:  Head: Normocephalic Eyes:EOM are normal Neck: Normal range of motion Cardiovascular: Normal rate Pulmonary/chest: Effort normal Neurologic: Patient is alert Skin: Skin is warm Psychiatric: Patient has normal mood and affect  Ortho Exam: Ortho exam demonstrates left shoulder with 50 degrees X rotation, 110 degrees abduction, 160 degrees forward elevation which is compared with the right shoulder with 50 degrees external rotation, 100 degrees abduction, 160 degrees forward elevation passively.  No obvious deformity to inspection of the shoulder.  Axillary nerve is intact with deltoid firing.  2+ radial pulse of the bilateral upper extremities.  Intact EPL, FPL, finger abduction, pronation/supination, bicep, tricep, deltoid.  Rotator cuff strength testing demonstrates intact supraspinatus and subscap strength rated 5/5 with infraspinatus/external rotation strength rated 4/5.  Tender with palpation over bicipital groove.  Negative crossarm adduction test.  Positive O'Brien sign.  Positive Neer's and Hawkins impingement signs.  No cellulitis or skin changes noted.  Slight crepitus noted with passive motion of the shoulder.    Negative Spurling sign.  Positive Lhermitte sign reproducing left arm radicular pain from the trapezius into the shoulder and bicep region.  Radicular left arm pain to the bicep region from the left side of the neck is reproduced with cervical spine rotation to the right.   Specialty Comments:  No specialty comments available.  Imaging: No results found.   PMFS History: Patient Active  Problem List   Diagnosis Date Noted   Protein-calorie malnutrition, severe 01/03/2024   Recurrent spontaneous pneumothorax 12/19/2023   Non-recurrent bilateral inguinal hernia without obstruction or gangrene    S/P lumbar discectomy 09/16/2021   Asthma 07/22/2021   Nausea 12/23/2020   Abdominal pain, epigastric 12/23/2020   Loss of weight 12/23/2020   Colon cancer screening 12/23/2020   Gout of ankle 04/11/2018   Penile lesion 04/11/2018   Essential hypertension 01/31/2018   Past Medical History:  Diagnosis Date   Asthma    COPD (chronic obstructive pulmonary disease) (HCC)    GERD (gastroesophageal reflux disease)    Gout    Hypertension    Sleep apnea    Umbilical hernia without obstruction and without gangrene     Family History  Problem Relation Age of Onset   Lung cancer Father    Asthma Son    Colon cancer Neg Hx    Esophageal cancer Neg Hx    Rectal cancer Neg Hx    Stomach cancer Neg Hx     Past Surgical History:  Procedure Laterality Date   CYST EXCISION     Patient states he was 48 years old, on his back   INTERCOSTAL NERVE  BLOCK Right 12/31/2023   Procedure: BLOCK, NERVE, INTERCOSTAL;  Surgeon: Shyrl Linnie KIDD, MD;  Location: [facility] OR;  Service: Open Heart Surgery;  Laterality: Right;   LUMBAR LAMINECTOMY/DECOMPRESSION MICRODISCECTOMY Left 09/16/2021   Procedure: LEFT L5-S1 MICRODISCECTOMY;  Surgeon: Clois Fret, MD;  Location: [facility] ORS;  Service: Neurosurgery;  Laterality: Left;   THORACOSCOPY, ROBOT-ASSISTED Right 12/31/2023   Procedure: ROBOTIC ASSISTED RIGHT VIDEO ASSISTED THORACOSCOPIC SURGERY, BLEB RESECTION, PLEURAL ABRASION;  Surgeon: Shyrl Linnie KIDD, MD;  Location: [facility] OR;  Service: Open Heart Surgery;  Laterality: Right;   UMBILICAL HERNIA REPAIR N/A 07/26/2018   Procedure: OPEN HERNIA REPAIR UMBILICAL ADULT WITH MESH;  Surgeon: Nicholaus Selinda Birmingham, MD;  Location: [facility] ORS;  Service: General;  Laterality: N/A;   XI ROBOTIC ASSISTED INGUINAL  HERNIA REPAIR WITH MESH Bilateral 01/31/2022   Procedure: XI ROBOTIC ASSISTED INGUINAL HERNIA REPAIR WITH MESH;  Surgeon: Desiderio Schanz, MD;  Location: [facility] ORS;  Service: General;  Laterality: Bilateral;   Social History   Occupational History   Occupation: Designer, fashion/clothing  Tobacco Use   Smoking status: Every Day    Current packs/day: 2.00    Average packs/day: 2.0 packs/day for 30.0 years (60.0 ttl pk-yrs)    Types: Cigarettes    Passive exposure: Current   Smokeless tobacco: Current    Types: Chew   Tobacco comments:    Smokes a ppd.  Wants to quit.  08/10/2021.  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

## 2024-02-10 ENCOUNTER — Encounter: Payer: Self-pay | Admitting: Surgical

## 2024-02-10 MED ORDER — PREDNISONE 10 MG (21) PO TBPK: ORAL_TABLET | ORAL | 0 refills | Status: AC

## 2024-02-10 MED ORDER — LIDOCAINE HCL 1 % IJ SOLN
10.0000 mL | INTRAMUSCULAR | Status: AC | PRN
  Administered 2024-02-08: 10 mL

## 2024-02-27 ENCOUNTER — Encounter: Payer: Self-pay | Admitting: Surgical

## 2024-02-28 ENCOUNTER — Telehealth: Payer: Self-pay | Admitting: Family

## 2024-02-28 NOTE — Telephone Encounter (Signed)
 Copied from CRM #[id number]. Topic: Referral - Question >> Feb 28, 2024  1:39 PM Delon T wrote: Reason for CRM: Billie with [facility]- need BMI to be at least 30.0 to qualify- need to be related to obesity - [phone]

## 2024-02-29 DIAGNOSIS — Z419 Encounter for procedure for purposes other than remedying health state, unspecified: Secondary | ICD-10-CM | POA: Diagnosis not present

## 2024-02-29 NOTE — Telephone Encounter (Signed)
 Call patient with update from [facility]. Note patient's BMI 22.56 kg/m during office visit on 01/28/2024.

## 2024-03-04 ENCOUNTER — Other Ambulatory Visit

## 2024-03-07 NOTE — Telephone Encounter (Signed)
 I called patient and phone went straight to voicemail and I was unable to leave a voicemail

## 2024-03-10 NOTE — Telephone Encounter (Signed)
 I called patient and mad him aware of the bmi information and patient did not know what I was taking about.

## 2024-03-17 ENCOUNTER — Telehealth: Payer: Self-pay

## 2024-03-17 ENCOUNTER — Telehealth: Payer: Self-pay | Admitting: Family

## 2024-03-17 ENCOUNTER — Other Ambulatory Visit: Payer: Self-pay | Admitting: Family

## 2024-03-17 DIAGNOSIS — G629 Polyneuropathy, unspecified: Secondary | ICD-10-CM

## 2024-03-17 DIAGNOSIS — F419 Anxiety disorder, unspecified: Secondary | ICD-10-CM

## 2024-03-17 DIAGNOSIS — J939 Pneumothorax, unspecified: Secondary | ICD-10-CM

## 2024-03-17 DIAGNOSIS — M109 Gout, unspecified: Secondary | ICD-10-CM

## 2024-03-17 MED ORDER — VENTOLIN HFA 108 (90 BASE) MCG/ACT IN AERS: 2.0000 | INHALATION_SPRAY | RESPIRATORY_TRACT | 2 refills | Status: AC

## 2024-03-17 NOTE — Telephone Encounter (Signed)
 Copied from CRM [id number]. Topic: Clinical - Medication Question >> Mar 17, 2024  3:22 PM Pinkey ORN wrote: Reason for CRM: Pain Medication >> Mar 17, 2024  3:25 PM Pinkey ORN wrote: Patient states he's not able to get in to have his MRI completed until October 20th, patient is wanting to know if there's some pain medication he can be prescribed that could help control the pain in his shoulder. Please follow up with patient

## 2024-03-17 NOTE — Telephone Encounter (Signed)
 Copied from CRM [id number]. Topic: Clinical - Medication Refill >> Mar 17, 2024  3:14 PM Pinkey ORN wrote: Medication: albuterol  (PROVENTIL ) (2.5 MG/3ML) 0.083% nebulizer solution  Has the patient contacted their pharmacy? Yes (Agent: If no, request that the patient contact the pharmacy for the refill. If patient does not wish to contact the pharmacy document the reason why and proceed with request.) (Agent: If yes, when and what did the pharmacy advise?)  This is the patient's preferred pharmacy:  [id number] - [place], [place] - [address] AT [facility] [address] [place] [address] Phone: [phone] Fax: [phone]  Is this the correct pharmacy for this prescription? Yes If no, delete pharmacy and type the correct one.   Has the prescription been filled recently? No  Is the patient out of the medication? Yes  Has the patient been seen for an appointment in the last year OR does the patient have an upcoming appointment? Yes  Can we respond through MyChart? No  Agent: Please be advised that Rx refills may take up to 3 business days. We ask that you follow-up with your pharmacy.

## 2024-03-17 NOTE — Telephone Encounter (Signed)
 Copied from CRM [id number]. Topic: Clinical - Medication Refill >> Mar 17, 2024  3:18 PM Pinkey ORN wrote: Medication: traZODone  (DESYREL ) 50 MG tablet, gabapentin  (NEURONTIN ) 300 MG capsule   Has the patient contacted their pharmacy? Yes (Agent: If no, request that the patient contact the pharmacy for the refill. If patient does not wish to contact the pharmacy document the reason why and proceed with request.) (Agent: If yes, when and what did the pharmacy advise?)  This is the patient's preferred pharmacy:  [id number] - [place], [place] - [address] AT [facility] [address] [place] [address] Phone: [phone] Fax: [phone]  Is this the correct pharmacy for this prescription? Yes If no, delete pharmacy and type the correct one.   Has the prescription been filled recently? No  Is the patient out of the medication? Yes  Has the patient been seen for an appointment in the last year OR does the patient have an upcoming appointment? Yes  Can we respond through MyChart? No  Agent: Please be advised that Rx refills may take up to 3 business days. We ask that you follow-up with your pharmacy.

## 2024-03-17 NOTE — Telephone Encounter (Signed)
 Complete

## 2024-03-17 NOTE — Telephone Encounter (Signed)
Patient requesting refill for inhaler

## 2024-03-18 ENCOUNTER — Other Ambulatory Visit: Payer: Self-pay | Admitting: Family

## 2024-03-18 DIAGNOSIS — J939 Pneumothorax, unspecified: Secondary | ICD-10-CM

## 2024-03-18 MED ORDER — ALBUTEROL SULFATE (2.5 MG/3ML) 0.083% IN NEBU: 2.5000 mg | INHALATION_SOLUTION | Freq: Four times a day (QID) | RESPIRATORY_TRACT | 2 refills | Status: AC | PRN

## 2024-03-18 NOTE — Telephone Encounter (Signed)
 Recommendation to request from rendering provider Magnant, Carlin CROME, PA-C at [facility].

## 2024-03-18 NOTE — Telephone Encounter (Signed)
 Complete

## 2024-03-18 NOTE — Telephone Encounter (Signed)
 I called patient and made him aware that his prescription was sent in

## 2024-03-19 MED ORDER — GABAPENTIN 300 MG PO CAPS: 300.0000 mg | ORAL_CAPSULE | Freq: Three times a day (TID) | ORAL | 0 refills | Status: AC

## 2024-03-19 MED ORDER — TRAZODONE HCL 50 MG PO TABS: 25.0000 mg | ORAL_TABLET | Freq: Every evening | ORAL | 0 refills | Status: AC | PRN

## 2024-03-19 NOTE — Telephone Encounter (Signed)
 Requested Prescriptions  Pending Prescriptions Disp Refills   traZODone  (DESYREL ) 50 MG tablet 90 tablet 0    Sig: Take 0.5-1 tablets (25-50 mg total) by mouth at bedtime as needed for sleep.     Psychiatry: Antidepressants - Serotonin Modulator Passed - 03/19/2024 12:24 PM      Passed - Valid encounter within last 6 months    Recent Outpatient Visits           1 month ago Hospital discharge follow-up   [facility], Amy J, NP   2 months ago Encounter to establish care   [facility] Care at [facility] [address], Amy J, NP               gabapentin  (NEURONTIN ) 300 MG capsule 270 capsule 0    Sig: Take 1 capsule (300 mg total) by mouth 3 (three) times daily.     Neurology: Anticonvulsants - gabapentin  Passed - 03/19/2024 12:24 PM      Passed - Cr in normal range and within 360 days    Creatinine, Ser  Date Value Ref Range Status  01/28/2024 0.93 0.76 - 1.27 mg/dL Final         Passed - Completed PHQ-2 or PHQ-9 in the last 360 days      Passed - Valid encounter within last 12 months    Recent Outpatient Visits           1 month ago Hospital discharge follow-up   [facility] at [facility], [place], NP   2 months ago Encounter to establish care   [facility] at [facility], Greig PARAS, NP

## 2024-03-19 NOTE — Telephone Encounter (Signed)
 Duplicate request- filled 03/18/24 Requested Prescriptions  Pending Prescriptions Disp Refills   albuterol  (PROVENTIL ) (2.5 MG/3ML) 0.083% nebulizer solution 75 mL     Sig: Take 3 mLs (2.5 mg total) by nebulization every 6 (six) hours as needed for shortness of breath or wheezing.     Pulmonology:  Beta Agonists 2 Passed - 03/19/2024 12:22 PM      Passed - Last BP in normal range    BP Readings from Last 1 Encounters:  01/28/24 119/80         Passed - Last Heart Rate in normal range    Pulse Readings from Last 1 Encounters:  01/28/24 88         Passed - Valid encounter within last 12 months    Recent Outpatient Visits           1 month ago Hospital discharge follow-up   [facility] at [facility], [place], NP   2 months ago Encounter to establish care   [facility] Care at [facility], Gre[place] PARAS, NP

## 2024-03-20 ENCOUNTER — Other Ambulatory Visit: Payer: Self-pay | Admitting: Family

## 2024-03-20 DIAGNOSIS — G629 Polyneuropathy, unspecified: Secondary | ICD-10-CM

## 2024-03-20 DIAGNOSIS — I10 Essential (primary) hypertension: Secondary | ICD-10-CM

## 2024-03-20 DIAGNOSIS — M109 Gout, unspecified: Secondary | ICD-10-CM

## 2024-03-20 DIAGNOSIS — K219 Gastro-esophageal reflux disease without esophagitis: Secondary | ICD-10-CM

## 2024-03-30 DIAGNOSIS — Z419 Encounter for procedure for purposes other than remedying health state, unspecified: Secondary | ICD-10-CM | POA: Diagnosis not present

## 2024-04-07 ENCOUNTER — Inpatient Hospital Stay: Admission: RE | Admit: 2024-04-07 | Source: Ambulatory Visit

## 2024-04-21 ENCOUNTER — Encounter: Payer: Self-pay | Admitting: Radiology

## 2024-04-23 ENCOUNTER — Other Ambulatory Visit: Payer: Self-pay | Admitting: Thoracic Surgery (Cardiothoracic Vascular Surgery)

## 2024-04-23 DIAGNOSIS — J9383 Other pneumothorax: Secondary | ICD-10-CM

## 2024-04-24 ENCOUNTER — Other Ambulatory Visit (HOSPITAL_COMMUNITY)

## 2024-04-24 ENCOUNTER — Ambulatory Visit

## 2024-04-25 NOTE — Progress Notes (Signed)
 No Show
